# Patient Record
Sex: Male | Born: 1945 | Race: White | Hispanic: No | Marital: Married | Smoking: Former smoker
Health system: Southern US, Community
[De-identification: ages and names within clinical notes are randomized; demographics above are authoritative.]

## PROBLEM LIST (undated history)

## (undated) DIAGNOSIS — IMO0002 Reserved for concepts with insufficient information to code with codable children: Secondary | ICD-10-CM

## (undated) DIAGNOSIS — M792 Neuralgia and neuritis, unspecified: Secondary | ICD-10-CM

## (undated) DIAGNOSIS — M1990 Unspecified osteoarthritis, unspecified site: Secondary | ICD-10-CM

## (undated) DIAGNOSIS — Z923 Personal history of irradiation: Secondary | ICD-10-CM

## (undated) DIAGNOSIS — C3490 Malignant neoplasm of unspecified part of unspecified bronchus or lung: Secondary | ICD-10-CM

## (undated) DIAGNOSIS — F0390 Unspecified dementia without behavioral disturbance: Secondary | ICD-10-CM

## (undated) DIAGNOSIS — I10 Essential (primary) hypertension: Secondary | ICD-10-CM

## (undated) DIAGNOSIS — F419 Anxiety disorder, unspecified: Secondary | ICD-10-CM

## (undated) DIAGNOSIS — I509 Heart failure, unspecified: Secondary | ICD-10-CM

## (undated) DIAGNOSIS — E785 Hyperlipidemia, unspecified: Secondary | ICD-10-CM

## (undated) DIAGNOSIS — I2510 Atherosclerotic heart disease of native coronary artery without angina pectoris: Secondary | ICD-10-CM

## (undated) DIAGNOSIS — I739 Peripheral vascular disease, unspecified: Secondary | ICD-10-CM

## (undated) HISTORY — DX: Heart failure, unspecified: I50.9

## (undated) HISTORY — DX: Unspecified osteoarthritis, unspecified site: M19.90

## (undated) HISTORY — DX: Unspecified dementia, unspecified severity, without behavioral disturbance, psychotic disturbance, mood disturbance, and anxiety: F03.90

## (undated) HISTORY — DX: Anxiety disorder, unspecified: F41.9

## (undated) HISTORY — DX: Atherosclerotic heart disease of native coronary artery without angina pectoris: I25.10

## (undated) HISTORY — DX: Neuralgia and neuritis, unspecified: M79.2

## (undated) HISTORY — PX: LUNG CANCER SURGERY: SHX702

## (undated) HISTORY — DX: Hyperlipidemia, unspecified: E78.5

## (undated) HISTORY — DX: Reserved for concepts with insufficient information to code with codable children: IMO0002

## (undated) HISTORY — DX: Peripheral vascular disease, unspecified: I73.9

---

## 1994-04-05 HISTORY — PX: CORONARY ARTERY BYPASS GRAFT: SHX141

## 1997-09-05 HISTORY — PX: CAROTID ENDARTERECTOMY: SUR193

## 2001-07-05 ENCOUNTER — Ambulatory Visit (HOSPITAL_COMMUNITY): Admission: RE | Admit: 2001-07-05 | Discharge: 2001-07-05 | Payer: Self-pay | Admitting: Cardiology

## 2001-07-05 ENCOUNTER — Encounter: Payer: Self-pay | Admitting: Cardiology

## 2004-11-01 ENCOUNTER — Ambulatory Visit: Payer: Self-pay | Admitting: Cardiology

## 2004-11-18 ENCOUNTER — Ambulatory Visit: Payer: Self-pay

## 2004-12-12 ENCOUNTER — Ambulatory Visit: Payer: Self-pay | Admitting: Cardiology

## 2004-12-16 ENCOUNTER — Ambulatory Visit: Payer: Self-pay

## 2004-12-16 ENCOUNTER — Ambulatory Visit: Payer: Self-pay | Admitting: Cardiology

## 2004-12-20 ENCOUNTER — Ambulatory Visit: Payer: Self-pay | Admitting: Cardiology

## 2004-12-20 ENCOUNTER — Ambulatory Visit (HOSPITAL_COMMUNITY): Admission: RE | Admit: 2004-12-20 | Discharge: 2004-12-21 | Payer: Self-pay | Admitting: Cardiology

## 2004-12-20 HISTORY — PX: CARDIAC CATHETERIZATION: SHX172

## 2004-12-27 ENCOUNTER — Ambulatory Visit: Payer: Self-pay | Admitting: Cardiology

## 2005-01-10 ENCOUNTER — Ambulatory Visit: Payer: Self-pay | Admitting: Cardiology

## 2005-01-24 ENCOUNTER — Ambulatory Visit: Payer: Self-pay | Admitting: Cardiology

## 2005-02-10 ENCOUNTER — Ambulatory Visit: Payer: Self-pay | Admitting: Cardiology

## 2005-06-13 ENCOUNTER — Ambulatory Visit: Payer: Self-pay | Admitting: Cardiology

## 2005-06-23 ENCOUNTER — Ambulatory Visit: Payer: Self-pay

## 2005-10-03 ENCOUNTER — Ambulatory Visit: Payer: Self-pay | Admitting: Cardiology

## 2005-11-03 ENCOUNTER — Ambulatory Visit: Payer: Self-pay | Admitting: Cardiology

## 2005-11-19 ENCOUNTER — Ambulatory Visit: Payer: Self-pay

## 2006-04-09 ENCOUNTER — Ambulatory Visit: Payer: Self-pay | Admitting: Cardiology

## 2006-04-17 ENCOUNTER — Ambulatory Visit: Payer: Self-pay

## 2006-10-12 ENCOUNTER — Ambulatory Visit: Payer: Self-pay | Admitting: Cardiology

## 2006-11-19 ENCOUNTER — Ambulatory Visit: Payer: Self-pay | Admitting: Cardiology

## 2006-11-19 ENCOUNTER — Ambulatory Visit: Payer: Self-pay

## 2006-11-19 LAB — CONVERTED CEMR LAB
ALT: 35 units/L (ref 0–40)
BUN: 4 mg/dL — ABNORMAL LOW (ref 6–23)
Bilirubin, Direct: 0.2 mg/dL (ref 0.0–0.3)
Calcium: 9.1 mg/dL (ref 8.4–10.5)
Cholesterol: 118 mg/dL (ref 0–200)
GFR calc Af Amer: 127 mL/min
GFR calc non Af Amer: 105 mL/min
Glucose, Bld: 106 mg/dL — ABNORMAL HIGH (ref 70–99)
LDL Cholesterol: 48 mg/dL (ref 0–99)
Total CHOL/HDL Ratio: 3.8

## 2007-04-19 ENCOUNTER — Ambulatory Visit: Payer: Self-pay | Admitting: Cardiology

## 2007-04-26 ENCOUNTER — Ambulatory Visit: Payer: Self-pay

## 2007-07-19 ENCOUNTER — Ambulatory Visit: Payer: Self-pay | Admitting: Cardiology

## 2007-07-19 LAB — CONVERTED CEMR LAB
Albumin: 3.5 g/dL (ref 3.5–5.2)
Bilirubin, Direct: 0.1 mg/dL (ref 0.0–0.3)
Total Bilirubin: 0.5 mg/dL (ref 0.3–1.2)
Total CHOL/HDL Ratio: 4.6
Total Protein: 6.9 g/dL (ref 6.0–8.3)
Triglycerides: 140 mg/dL (ref 0–149)

## 2007-11-22 ENCOUNTER — Ambulatory Visit: Payer: Self-pay

## 2008-02-01 ENCOUNTER — Ambulatory Visit: Payer: Self-pay | Admitting: Cardiology

## 2008-04-11 ENCOUNTER — Ambulatory Visit: Payer: Self-pay | Admitting: Cardiology

## 2008-10-02 ENCOUNTER — Ambulatory Visit: Payer: Self-pay | Admitting: Internal Medicine

## 2008-10-02 ENCOUNTER — Emergency Department (HOSPITAL_COMMUNITY): Admission: EM | Admit: 2008-10-02 | Discharge: 2008-10-02 | Payer: Self-pay | Admitting: Emergency Medicine

## 2008-10-03 ENCOUNTER — Telehealth (INDEPENDENT_AMBULATORY_CARE_PROVIDER_SITE_OTHER): Payer: Self-pay | Admitting: *Deleted

## 2008-10-16 ENCOUNTER — Ambulatory Visit: Payer: Self-pay | Admitting: Internal Medicine

## 2008-10-16 DIAGNOSIS — I10 Essential (primary) hypertension: Secondary | ICD-10-CM | POA: Insufficient documentation

## 2008-10-16 DIAGNOSIS — R222 Localized swelling, mass and lump, trunk: Secondary | ICD-10-CM | POA: Insufficient documentation

## 2008-10-16 DIAGNOSIS — E785 Hyperlipidemia, unspecified: Secondary | ICD-10-CM

## 2008-10-16 DIAGNOSIS — I219 Acute myocardial infarction, unspecified: Secondary | ICD-10-CM | POA: Insufficient documentation

## 2008-10-16 DIAGNOSIS — J309 Allergic rhinitis, unspecified: Secondary | ICD-10-CM | POA: Insufficient documentation

## 2008-10-19 ENCOUNTER — Ambulatory Visit: Admission: RE | Admit: 2008-10-19 | Discharge: 2008-10-19 | Payer: Self-pay | Admitting: Internal Medicine

## 2008-10-19 ENCOUNTER — Ambulatory Visit: Payer: Self-pay | Admitting: Internal Medicine

## 2008-10-19 ENCOUNTER — Encounter: Payer: Self-pay | Admitting: Internal Medicine

## 2008-10-25 ENCOUNTER — Encounter: Payer: Self-pay | Admitting: Internal Medicine

## 2008-10-25 ENCOUNTER — Ambulatory Visit (HOSPITAL_COMMUNITY): Admission: RE | Admit: 2008-10-25 | Discharge: 2008-10-25 | Payer: Self-pay | Admitting: Internal Medicine

## 2008-10-30 ENCOUNTER — Ambulatory Visit: Payer: Self-pay | Admitting: Internal Medicine

## 2008-11-02 ENCOUNTER — Ambulatory Visit: Payer: Self-pay | Admitting: Internal Medicine

## 2008-11-02 ENCOUNTER — Encounter: Payer: Self-pay | Admitting: Internal Medicine

## 2008-11-06 ENCOUNTER — Ambulatory Visit (HOSPITAL_COMMUNITY): Admission: RE | Admit: 2008-11-06 | Discharge: 2008-11-06 | Payer: Self-pay | Admitting: Cardiothoracic Surgery

## 2008-11-09 ENCOUNTER — Ambulatory Visit: Payer: Self-pay | Admitting: Internal Medicine

## 2008-11-13 ENCOUNTER — Ambulatory Visit: Admission: RE | Admit: 2008-11-13 | Discharge: 2009-02-06 | Payer: Self-pay | Admitting: Radiation Oncology

## 2008-11-13 ENCOUNTER — Ambulatory Visit: Payer: Self-pay | Admitting: Cardiology

## 2008-11-14 ENCOUNTER — Encounter (INDEPENDENT_AMBULATORY_CARE_PROVIDER_SITE_OTHER): Payer: Self-pay | Admitting: *Deleted

## 2008-11-14 LAB — COMPREHENSIVE METABOLIC PANEL
ALT: <8 U/L (ref 0–53)
AST: 9 U/L (ref 0–37)
Albumin: 4.1 g/dL (ref 3.5–5.2)
Alkaline Phosphatase: 107 U/L (ref 39–117)
Potassium: 3.6 mEq/L (ref 3.5–5.3)
Sodium: 141 mEq/L (ref 135–145)
Total Bilirubin: 0.4 mg/dL (ref 0.3–1.2)
Total Protein: 7 g/dL (ref 6.0–8.3)

## 2008-11-14 LAB — CBC WITH DIFFERENTIAL/PLATELET
BASO%: 0.3 % (ref 0.0–2.0)
EOS%: 1.7 % (ref 0.0–7.0)
Eosinophils Absolute: 0.2 10*3/uL (ref 0.0–0.5)
MCH: 27.9 pg — ABNORMAL LOW (ref 28.0–33.4)
MCHC: 34.5 g/dL (ref 32.0–35.9)
MCV: 81 fL — ABNORMAL LOW (ref 81.6–98.0)
MONO%: 5.2 % (ref 0.0–13.0)
NEUT#: 7.7 10*3/uL — ABNORMAL HIGH (ref 1.5–6.5)
RBC: 4.59 10*6/uL (ref 4.20–5.71)
RDW: 14.5 % (ref 11.2–14.6)

## 2008-11-14 LAB — CONVERTED CEMR LAB
BUN: 9 mg/dL
CO2: 26 meq/L
Calcium: 8.6 mg/dL
Chloride: 103 meq/L
Creatinine, Ser: 0.61 mg/dL
Potassium: 3.6 meq/L
Total Protein: 7 g/dL

## 2008-11-20 LAB — CBC WITH DIFFERENTIAL/PLATELET
Basophils Absolute: 0 10*3/uL (ref 0.0–0.1)
Eosinophils Absolute: 0.3 10*3/uL (ref 0.0–0.5)
HGB: 12.8 g/dL — ABNORMAL LOW (ref 13.0–17.1)
MCV: 80.6 fL — ABNORMAL LOW (ref 81.6–98.0)
NEUT#: 5.8 10*3/uL (ref 1.5–6.5)
RDW: 13.8 % (ref 11.2–14.6)
lymph#: 0.9 10*3/uL (ref 0.9–3.3)

## 2008-11-20 LAB — COMPREHENSIVE METABOLIC PANEL
Albumin: 4.2 g/dL (ref 3.5–5.2)
BUN: 8 mg/dL (ref 6–23)
Calcium: 8.9 mg/dL (ref 8.4–10.5)
Chloride: 104 mEq/L (ref 96–112)
Glucose, Bld: 104 mg/dL — ABNORMAL HIGH (ref 70–99)
Potassium: 3.8 mEq/L (ref 3.5–5.3)

## 2008-11-22 ENCOUNTER — Ambulatory Visit: Payer: Self-pay

## 2008-11-27 LAB — COMPREHENSIVE METABOLIC PANEL
ALT: 17 U/L (ref 0–53)
Albumin: 4 g/dL (ref 3.5–5.2)
Alkaline Phosphatase: 113 U/L (ref 39–117)
Glucose, Bld: 132 mg/dL — ABNORMAL HIGH (ref 70–99)
Potassium: 3.8 mEq/L (ref 3.5–5.3)
Sodium: 139 mEq/L (ref 135–145)
Total Protein: 6.6 g/dL (ref 6.0–8.3)

## 2008-11-27 LAB — CBC WITH DIFFERENTIAL/PLATELET
Basophils Absolute: 0 10*3/uL (ref 0.0–0.1)
EOS%: 0.9 % (ref 0.0–7.0)
Eosinophils Absolute: 0.1 10*3/uL (ref 0.0–0.5)
HGB: 12.6 g/dL — ABNORMAL LOW (ref 13.0–17.1)
NEUT#: 6.3 10*3/uL (ref 1.5–6.5)
RDW: 13.9 % (ref 11.0–14.6)
lymph#: 1 10*3/uL (ref 0.9–3.3)

## 2008-11-28 ENCOUNTER — Ambulatory Visit: Payer: Self-pay | Admitting: Cardiology

## 2008-11-28 ENCOUNTER — Inpatient Hospital Stay (HOSPITAL_COMMUNITY): Admission: EM | Admit: 2008-11-28 | Discharge: 2008-11-29 | Payer: Self-pay | Admitting: Emergency Medicine

## 2008-12-11 LAB — CBC WITH DIFFERENTIAL/PLATELET
Basophils Absolute: 0 10*3/uL (ref 0.0–0.1)
EOS%: 0.9 % (ref 0.0–7.0)
Eosinophils Absolute: 0 10*3/uL (ref 0.0–0.5)
HCT: 32 % — ABNORMAL LOW (ref 38.4–49.9)
HGB: 11 g/dL — ABNORMAL LOW (ref 13.0–17.1)
MCH: 28 pg (ref 27.2–33.4)
MCV: 81.5 fL (ref 79.3–98.0)
MONO%: 7.8 % (ref 0.0–14.0)
NEUT#: 2.6 10*3/uL (ref 1.5–6.5)
NEUT%: 75.2 % — ABNORMAL HIGH (ref 39.0–75.0)
RDW: 14.9 % — ABNORMAL HIGH (ref 11.0–14.6)

## 2008-12-11 LAB — COMPREHENSIVE METABOLIC PANEL
Albumin: 3.5 g/dL (ref 3.5–5.2)
Alkaline Phosphatase: 83 U/L (ref 39–117)
BUN: 7 mg/dL (ref 6–23)
Calcium: 8 mg/dL — ABNORMAL LOW (ref 8.4–10.5)
Chloride: 103 mEq/L (ref 96–112)
Creatinine, Ser: 0.49 mg/dL (ref 0.40–1.50)
Glucose, Bld: 138 mg/dL — ABNORMAL HIGH (ref 70–99)
Potassium: 3.7 mEq/L (ref 3.5–5.3)

## 2008-12-12 ENCOUNTER — Inpatient Hospital Stay (HOSPITAL_COMMUNITY): Admission: EM | Admit: 2008-12-12 | Discharge: 2008-12-18 | Payer: Self-pay | Admitting: Emergency Medicine

## 2008-12-12 ENCOUNTER — Ambulatory Visit: Payer: Self-pay | Admitting: Internal Medicine

## 2008-12-13 DIAGNOSIS — I25810 Atherosclerosis of coronary artery bypass graft(s) without angina pectoris: Secondary | ICD-10-CM | POA: Insufficient documentation

## 2008-12-13 DIAGNOSIS — I6529 Occlusion and stenosis of unspecified carotid artery: Secondary | ICD-10-CM

## 2008-12-13 DIAGNOSIS — I10 Essential (primary) hypertension: Secondary | ICD-10-CM

## 2008-12-21 ENCOUNTER — Ambulatory Visit: Payer: Self-pay | Admitting: Internal Medicine

## 2008-12-27 ENCOUNTER — Encounter: Payer: Self-pay | Admitting: Physician Assistant

## 2008-12-27 ENCOUNTER — Ambulatory Visit: Payer: Self-pay | Admitting: Internal Medicine

## 2008-12-27 LAB — CONVERTED CEMR LAB
Eosinophils Relative: 0.6 % (ref 0.0–5.0)
GFR calc non Af Amer: 178.61 mL/min (ref >60)
HCT: 31.4 % — ABNORMAL LOW (ref 39.0–52.0)
Hemoglobin: 10.9 g/dL — ABNORMAL LOW (ref 13.0–17.0)
Lymphs Abs: 0.7 10*3/uL (ref 0.7–4.0)
Monocytes Relative: 11.6 % (ref 3.0–12.0)
Neutro Abs: 3.9 10*3/uL (ref 1.4–7.7)
Potassium: 3.9 meq/L (ref 3.5–5.1)
RBC: 3.75 M/uL — ABNORMAL LOW (ref 4.22–5.81)
Sodium: 141 meq/L (ref 135–145)
WBC: 5.2 10*3/uL (ref 4.5–10.5)

## 2008-12-29 LAB — BASIC METABOLIC PANEL
Chloride: 101 mEq/L (ref 96–112)
Creatinine, Ser: 0.64 mg/dL (ref 0.40–1.50)
Potassium: 3.9 mEq/L (ref 3.5–5.3)
Sodium: 138 mEq/L (ref 135–145)

## 2008-12-29 LAB — CBC WITH DIFFERENTIAL/PLATELET
Basophils Absolute: 0 10*3/uL (ref 0.0–0.1)
Eosinophils Absolute: 0 10*3/uL (ref 0.0–0.5)
HGB: 11.6 g/dL — ABNORMAL LOW (ref 13.0–17.1)
MONO#: 0.6 10*3/uL (ref 0.1–0.9)
NEUT#: 4.4 10*3/uL (ref 1.5–6.5)
RBC: 4.25 10*6/uL (ref 4.20–5.82)
RDW: 17.3 % — ABNORMAL HIGH (ref 11.0–14.6)
WBC: 5.6 10*3/uL (ref 4.0–10.3)
nRBC: 0 % (ref 0–0)

## 2008-12-29 LAB — HOLD TUBE, BLOOD BANK

## 2009-01-01 LAB — COMPREHENSIVE METABOLIC PANEL
Albumin: 4.1 g/dL (ref 3.5–5.2)
Alkaline Phosphatase: 87 U/L (ref 39–117)
BUN: 13 mg/dL (ref 6–23)
Calcium: 8.9 mg/dL (ref 8.4–10.5)
Glucose, Bld: 127 mg/dL — ABNORMAL HIGH (ref 70–99)
Potassium: 3.8 mEq/L (ref 3.5–5.3)

## 2009-01-01 LAB — CBC WITH DIFFERENTIAL/PLATELET
Basophils Absolute: 0 10*3/uL (ref 0.0–0.1)
Eosinophils Absolute: 0 10*3/uL (ref 0.0–0.5)
HCT: 34.2 % — ABNORMAL LOW (ref 38.4–49.9)
HGB: 11.8 g/dL — ABNORMAL LOW (ref 13.0–17.1)
MCV: 82.7 fL (ref 79.3–98.0)
MONO%: 7.1 % (ref 0.0–14.0)
NEUT#: 6.2 10*3/uL (ref 1.5–6.5)
NEUT%: 83 % — ABNORMAL HIGH (ref 39.0–75.0)
Platelets: 183 10*3/uL (ref 140–400)
RDW: 18 % — ABNORMAL HIGH (ref 11.0–14.6)

## 2009-01-16 ENCOUNTER — Ambulatory Visit (HOSPITAL_COMMUNITY): Admission: RE | Admit: 2009-01-16 | Discharge: 2009-01-16 | Payer: Self-pay | Admitting: Radiation Oncology

## 2009-02-03 DIAGNOSIS — IMO0001 Reserved for inherently not codable concepts without codable children: Secondary | ICD-10-CM

## 2009-02-03 HISTORY — DX: Reserved for inherently not codable concepts without codable children: IMO0001

## 2009-02-06 ENCOUNTER — Ambulatory Visit: Admission: RE | Admit: 2009-02-06 | Discharge: 2009-02-23 | Payer: Self-pay | Admitting: Radiation Oncology

## 2009-02-14 ENCOUNTER — Ambulatory Visit: Payer: Self-pay | Admitting: Cardiology

## 2009-03-06 ENCOUNTER — Encounter: Payer: Self-pay | Admitting: Internal Medicine

## 2009-04-24 ENCOUNTER — Ambulatory Visit: Admission: RE | Admit: 2009-04-24 | Discharge: 2009-04-24 | Payer: Self-pay | Admitting: Radiation Oncology

## 2009-04-24 LAB — CREATININE, SERUM: Creatinine, Ser: 0.7 mg/dL (ref 0.40–1.50)

## 2009-04-24 LAB — BUN: BUN: 6 mg/dL (ref 6–23)

## 2009-04-25 ENCOUNTER — Ambulatory Visit (HOSPITAL_COMMUNITY): Admission: RE | Admit: 2009-04-25 | Discharge: 2009-04-25 | Payer: Self-pay | Admitting: Radiation Oncology

## 2009-04-26 ENCOUNTER — Ambulatory Visit: Admission: RE | Admit: 2009-04-26 | Discharge: 2009-04-26 | Payer: Self-pay | Admitting: Radiation Oncology

## 2009-04-26 ENCOUNTER — Encounter: Payer: Self-pay | Admitting: Cardiology

## 2009-04-26 LAB — BASIC METABOLIC PANEL
CO2: 26 mEq/L (ref 19–32)
Chloride: 104 mEq/L (ref 96–112)
Creatinine, Ser: 0.67 mg/dL (ref 0.40–1.50)
Potassium: 3.8 mEq/L (ref 3.5–5.3)
Sodium: 140 mEq/L (ref 135–145)

## 2009-05-18 ENCOUNTER — Ambulatory Visit: Payer: Self-pay | Admitting: Cardiology

## 2009-07-30 ENCOUNTER — Ambulatory Visit: Admission: RE | Admit: 2009-07-30 | Discharge: 2009-07-30 | Payer: Self-pay | Admitting: Radiation Oncology

## 2009-07-31 ENCOUNTER — Ambulatory Visit (HOSPITAL_COMMUNITY): Admission: RE | Admit: 2009-07-31 | Discharge: 2009-07-31 | Payer: Self-pay | Admitting: Radiation Oncology

## 2009-08-02 ENCOUNTER — Encounter: Payer: Self-pay | Admitting: Internal Medicine

## 2009-08-02 ENCOUNTER — Ambulatory Visit: Admission: RE | Admit: 2009-08-02 | Discharge: 2009-08-02 | Payer: Self-pay | Admitting: Radiation Oncology

## 2009-09-19 ENCOUNTER — Encounter (INDEPENDENT_AMBULATORY_CARE_PROVIDER_SITE_OTHER): Payer: Self-pay | Admitting: *Deleted

## 2009-09-21 ENCOUNTER — Ambulatory Visit: Payer: Self-pay | Admitting: Cardiology

## 2009-11-01 ENCOUNTER — Ambulatory Visit: Admission: RE | Admit: 2009-11-01 | Discharge: 2009-11-01 | Payer: Self-pay | Admitting: Radiation Oncology

## 2009-11-01 LAB — CREATININE, SERUM: Creatinine, Ser: 0.95 mg/dL (ref 0.40–1.50)

## 2009-11-01 LAB — BUN: BUN: 11 mg/dL (ref 6–23)

## 2009-11-02 ENCOUNTER — Ambulatory Visit (HOSPITAL_COMMUNITY): Admission: RE | Admit: 2009-11-02 | Discharge: 2009-11-02 | Payer: Self-pay | Admitting: Radiation Oncology

## 2009-11-08 ENCOUNTER — Encounter: Payer: Self-pay | Admitting: Cardiology

## 2009-11-28 ENCOUNTER — Encounter: Payer: Self-pay | Admitting: Cardiology

## 2009-11-29 ENCOUNTER — Ambulatory Visit: Payer: Self-pay

## 2009-11-29 ENCOUNTER — Encounter: Payer: Self-pay | Admitting: Cardiology

## 2010-02-04 ENCOUNTER — Ambulatory Visit (HOSPITAL_COMMUNITY): Admission: RE | Admit: 2010-02-04 | Discharge: 2010-02-04 | Payer: Self-pay | Admitting: Radiation Oncology

## 2010-02-07 ENCOUNTER — Encounter: Payer: Self-pay | Admitting: Cardiology

## 2010-02-07 ENCOUNTER — Ambulatory Visit: Admission: RE | Admit: 2010-02-07 | Discharge: 2010-02-07 | Payer: Self-pay | Admitting: Radiation Oncology

## 2010-02-07 LAB — BUN: BUN: 9 mg/dL (ref 6–23)

## 2010-02-13 ENCOUNTER — Ambulatory Visit (HOSPITAL_COMMUNITY): Admission: RE | Admit: 2010-02-13 | Discharge: 2010-02-13 | Payer: Self-pay | Admitting: Radiation Oncology

## 2010-03-15 ENCOUNTER — Ambulatory Visit: Payer: Self-pay | Admitting: Cardiology

## 2010-06-21 ENCOUNTER — Encounter (HOSPITAL_COMMUNITY): Admission: RE | Admit: 2010-06-21 | Discharge: 2010-06-21 | Payer: Self-pay | Admitting: Radiation Oncology

## 2010-06-21 ENCOUNTER — Other Ambulatory Visit: Payer: Self-pay | Admitting: Radiation Oncology

## 2010-06-21 LAB — CREATININE, SERUM: Creatinine, Ser: 0.82 mg/dL (ref 0.40–1.50)

## 2010-06-27 ENCOUNTER — Ambulatory Visit (HOSPITAL_COMMUNITY): Admission: RE | Admit: 2010-06-27 | Discharge: 2010-06-27 | Payer: Self-pay | Admitting: Radiation Oncology

## 2010-10-22 ENCOUNTER — Encounter: Payer: Self-pay | Admitting: Cardiology

## 2010-10-25 ENCOUNTER — Other Ambulatory Visit: Payer: Self-pay | Admitting: Radiation Oncology

## 2010-10-25 DIAGNOSIS — C3490 Malignant neoplasm of unspecified part of unspecified bronchus or lung: Secondary | ICD-10-CM

## 2010-11-07 NOTE — Assessment & Plan Note (Signed)
Summary: G9F   Visit Type:  Follow-up Referring Provider:  Dr. Carylon Perches Primary Provider:  Gabrielle Dare np  CC:  no cardiology complaints.  History of Present Illness: [name] comes in today for followup of his coronary artery disease, carotid artery disease, hypertension, hyperlipidemia.  His lung cancer is in remission. He looks the best I seen him in months. He denies angina or ischemic symptoms. He is having no dyspnea on exertion. He is staying quite active.  His weight is now very stable. He denies any symptoms of TIAs or mini strokes. He is very compliant with his medications.  His last carotid Dopplers were in February 2011. His a totally occluded left internal carotid artery, nonobstructive disease in the right internal carotid artery, patent graft on the right, and antegrade flow in both vertebrals. Findings are essentially stable.  Current Medications (verified): 1)  Amlodipine Besylate 10 Mg Tabs (Amlodipine Besylate) .... Once Daily 2)  Crestor 10 Mg Tabs (Rosuvastatin Calcium) .... Once Daily 3)  Alprazolam 0.5 Mg Tabs (Alprazolam) .... Three Times A Day 4)  Zetia 10 Mg Tabs (Ezetimibe) .... Once Daily 5)  Hydrochlorothiazide 12.5 Mg Caps (Hydrochlorothiazide) .... Once Daily 6)  Klor-Con M20 20 Meq Cr-Tabs (Potassium Chloride Crys Cr) .... Once Daily 7)  Hydrocodone-Acetaminophen 5-500 Mg Tabs (Hydrocodone-Acetaminophen) .... Four Times A Day 8)  Aspirin 325 Mg  Tabs (Aspirin) .... Once Daily 9)  Anti-Oxidant  Tabs (Multiple Vitamin) .... Once Daily 10)  Metoprolol Tartrate 50 Mg Tabs (Metoprolol Tartrate) .Marland Kitchen.. 1 Tab Two Times A Day 11)  Nitrostat 0.4 Mg Subl (Nitroglycerin) .Marland Kitchen.. 1 Tablet Under Tongue At Onset of Chest Pain; You May Repeat Every 5 Minutes For Up To 3 Doses. 12)  Gabapentin 300 Mg Caps (Gabapentin) .... Take 1 Tab Daily  Allergies (verified): 1)  ! Darvocet 2)  ! Penicillin  Past History:  Past Medical History: Last updated:  12/13/2008 Current Problems:  HYPERTENSION, BENIGN (ICD-401.1) CAROTID ARTERY STENOSIS, WITHOUT INFARCTION (ICD-433.10)S/P RIGHT CAROTID ENDARTERECTOMY, LEFT TOTALLY OCCLUDED. CAD, ARTERY BYPASS GRAFT (ICD-414.04)S/P CABG 1995, STENT OM VEIN GRAFT '06. CATH 11/29/08 SEVERE NATIVE VESSEL DISEASE WITH 100% LEFT MAINSTEM & RCA. 5 OF 5 GRAFTS PATENT, PATENT STENT SVG-OM1 & OM2 WITH MILD IN STENT RESTENOSIS, NORMAL LV FXN, NO CULPRIT FOR NON-ST ELEVATION MI SWELLING, MASS, OR LUMP IN CHEST (ICD-786.6) Hx of MYOCARDIAL INFARCTION (ICD-410.90) HYPERTENSION (ICD-401.9) HYPERLIPIDEMIA (ICD-272.4) ALLERGIC RHINITIS (ICD-477.9) NON-SMALL CELL LUNG CA RX CHEMO/RADIATION    Allergic Rhinitis Hyperlipidemia Hypertension Myocardial Infarction Right Apical Lung Mass dx 10/02/08  Past Surgical History: Last updated: 10/16/2008 angioplast neck surgery heart bypass stent  Family History: Last updated: 12/13/2008 heart disease-father, sister,brother cancer-father- lung mother died MI 3 sisters & 3 brothers with CAD  Social History: Last updated: 12/13/2008 married 2 girls retired Merchandiser, 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 Head:  normocephalic and atraumatic Eyes:  PERRLA/EOM intact; conjunctiva and lids normal. Neck:  Neck supple, no JVD. No masses, thyromegaly or abnormal cervical nodes. Lungs:  degrees breath sounds throughout Heart:  PMI nondisplaced, regular rate  and rhythm, normal S1-S2, bilateral carotid bruits with a loud high-pitched sound the left Abdomen:  Bowel sounds positive; abdomen soft and non-tender without masses, organomegaly, or hernias noted. No hepatosplenomegaly. Msk:  decreased ROM.   Pulses:  pulses normal in all 4 extremities Extremities:  trace left pedal edema and trace right pedal edema.   Neurologic:  Alert and oriented x 3. Skin:  Intact without lesions or rashes. Psych:  Normal affect.   Impression & Recommendations:  Problem # 1:  CAD, ARTERY BYPASS GRAFT (ICD-414.04)  His updated medication list for this problem includes:    Amlodipine Besylate 10 Mg Tabs (Amlodipine besylate) ..... Once daily    Aspirin 325 Mg Tabs (Aspirin) ..... Once daily    Metoprolol Tartrate 50 Mg Tabs (Metoprolol tartrate) .Marland Kitchen... 1 tab two times a day    Nitrostat 0.4 Mg Subl (Nitroglycerin) .Marland Kitchen... 1 tablet under tongue at onset of chest pain; you may repeat every 5 minutes for up to 3 doses.  Problem # 2:  CAROTID ARTERY STENOSIS, WITHOUT INFARCTION (ICD-433.10) I will see him in February of 2012 with repeat carotid Dopplers. His updated medication list for this problem includes:    Aspirin 325 Mg Tabs (Aspirin) ..... Once daily  Problem # 3:  Hx of MYOCARDIAL INFARCTION (ICD-410.90)  His updated medication list for this problem includes:    Amlodipine Besylate 10 Mg Tabs (Amlodipine besylate) ..... Once daily    Aspirin 325 Mg Tabs (Aspirin) ..... Once daily    Metoprolol Tartrate 50 Mg Tabs (Metoprolol tartrate) .Marland Kitchen... 1 tab two times a day    Nitrostat 0.4 Mg Subl (Nitroglycerin) .Marland Kitchen... 1 tablet under tongue at onset of chest pain; you may repeat every 5 minutes for up to 3 doses.  Problem # 4:  HYPERTENSION (ICD-401.9)  His updated medication list for this problem includes:    Amlodipine Besylate 10 Mg Tabs (Amlodipine besylate) ..... Once daily    Hydrochlorothiazide 12.5 Mg Caps (Hydrochlorothiazide) ..... Once daily    Aspirin  325 Mg Tabs (Aspirin) ..... Once daily    Metoprolol Tartrate 50 Mg Tabs (Metoprolol tartrate) .Marland Kitchen... 1 tab two times a day  Problem # 5:  HYPERLIPIDEMIA (ICD-272.4)  His updated medication list for this problem includes:    Crestor 10 Mg Tabs (Rosuvastatin calcium) ..... Once daily    Zetia 10 Mg Tabs (Ezetimibe) ..... Once daily   Patient Instructions: 1)  Your physician recommends that you schedule a follow-up appointment on: 02-12, 2012 2)  Your physician recommends that you continue on your current medications as directed. Please refer to the Current Medication list given to you today.

## 2010-11-07 NOTE — Miscellaneous (Signed)
Summary: Orders Update  Clinical Lists Changes  Orders: Added new Test order of Carotid Duplex (Carotid Duplex) - Signed 

## 2010-11-07 NOTE — Letter (Signed)
[facility]   Imported By: Marylou Mccoy 03/26/2010 11:47:10  _____________________________________________________________________  External Attachment:    Type:   Image     Comment:   External Document

## 2010-11-07 NOTE — Letter (Signed)
[facility]   Imported By: Roderic Ovens 12/25/2009 13:41:52  _____________________________________________________________________  External Attachment:    Type:   Image     Comment:   External Document

## 2010-11-08 ENCOUNTER — Ambulatory Visit: Payer: Medicare Other | Admitting: Internal Medicine

## 2010-11-08 LAB — CREATININE, SERUM: Creatinine, Ser: 0.9 mg/dL (ref 0.40–1.50)

## 2010-11-11 ENCOUNTER — Ambulatory Visit (HOSPITAL_COMMUNITY)
Admission: RE | Admit: 2010-11-11 | Discharge: 2010-11-11 | Disposition: A | Discharge status: Home / Self Care | Payer: Medicare Other | Source: Ambulatory Visit | Attending: Radiation Oncology | Admitting: Radiation Oncology

## 2010-11-11 ENCOUNTER — Encounter (HOSPITAL_COMMUNITY): Payer: Self-pay

## 2010-11-11 DIAGNOSIS — Z79899 Other long term (current) drug therapy: Secondary | ICD-10-CM | POA: Insufficient documentation

## 2010-11-11 DIAGNOSIS — Z09 Encounter for follow-up examination after completed treatment for conditions other than malignant neoplasm: Secondary | ICD-10-CM | POA: Insufficient documentation

## 2010-11-11 DIAGNOSIS — C3490 Malignant neoplasm of unspecified part of unspecified bronchus or lung: Secondary | ICD-10-CM | POA: Insufficient documentation

## 2010-11-11 HISTORY — DX: Malignant neoplasm of unspecified part of unspecified bronchus or lung: C34.90

## 2010-11-11 MED ORDER — IOHEXOL 300 MG/ML  SOLN
80.0000 mL | Freq: Once | INTRAMUSCULAR | Status: AC | PRN
  Administered 2010-11-11: 80 mL via INTRAVENOUS

## 2010-11-14 ENCOUNTER — Ambulatory Visit: Payer: Medicare Other | Admitting: Radiation Oncology

## 2010-11-14 ENCOUNTER — Encounter (HOSPITAL_COMMUNITY): Payer: Self-pay | Admitting: Radiology

## 2010-11-14 ENCOUNTER — Inpatient Hospital Stay (HOSPITAL_COMMUNITY)
Admission: EM | Admit: 2010-11-14 | Discharge: 2010-11-19 | DRG: 247 | Disposition: A | Discharge status: Home / Self Care | Payer: Medicare Other | Attending: Internal Medicine | Admitting: Internal Medicine

## 2010-11-14 ENCOUNTER — Emergency Department (HOSPITAL_COMMUNITY): Payer: Medicare Other

## 2010-11-14 DIAGNOSIS — E785 Hyperlipidemia, unspecified: Secondary | ICD-10-CM | POA: Diagnosis present

## 2010-11-14 DIAGNOSIS — I2582 Chronic total occlusion of coronary artery: Secondary | ICD-10-CM | POA: Diagnosis present

## 2010-11-14 DIAGNOSIS — Y849 Medical procedure, unspecified as the cause of abnormal reaction of the patient, or of later complication, without mention of misadventure at the time of the procedure: Secondary | ICD-10-CM | POA: Diagnosis present

## 2010-11-14 DIAGNOSIS — I25810 Atherosclerosis of coronary artery bypass graft(s) without angina pectoris: Secondary | ICD-10-CM

## 2010-11-14 DIAGNOSIS — I6529 Occlusion and stenosis of unspecified carotid artery: Secondary | ICD-10-CM | POA: Diagnosis present

## 2010-11-14 DIAGNOSIS — Z85118 Personal history of other malignant neoplasm of bronchus and lung: Secondary | ICD-10-CM

## 2010-11-14 DIAGNOSIS — T82897A Other specified complication of cardiac prosthetic devices, implants and grafts, initial encounter: Secondary | ICD-10-CM | POA: Diagnosis present

## 2010-11-14 DIAGNOSIS — Z888 Allergy status to other drugs, medicaments and biological substances status: Secondary | ICD-10-CM

## 2010-11-14 DIAGNOSIS — Z7982 Long term (current) use of aspirin: Secondary | ICD-10-CM

## 2010-11-14 DIAGNOSIS — I10 Essential (primary) hypertension: Secondary | ICD-10-CM | POA: Diagnosis present

## 2010-11-14 DIAGNOSIS — J309 Allergic rhinitis, unspecified: Secondary | ICD-10-CM | POA: Diagnosis present

## 2010-11-14 DIAGNOSIS — I214 Non-ST elevation (NSTEMI) myocardial infarction: Principal | ICD-10-CM | POA: Diagnosis present

## 2010-11-14 DIAGNOSIS — Z79899 Other long term (current) drug therapy: Secondary | ICD-10-CM

## 2010-11-14 DIAGNOSIS — I252 Old myocardial infarction: Secondary | ICD-10-CM

## 2010-11-14 DIAGNOSIS — I2510 Atherosclerotic heart disease of native coronary artery without angina pectoris: Secondary | ICD-10-CM | POA: Diagnosis present

## 2010-11-14 HISTORY — DX: Essential (primary) hypertension: I10

## 2010-11-14 LAB — CK TOTAL AND CKMB (NOT AT ARMC)
CK, MB: 115.3 ng/mL (ref 0.3–4.0)
Relative Index: 14.7 — ABNORMAL HIGH (ref 0.0–2.5)
Total CK: 783 U/L — ABNORMAL HIGH (ref 7–232)

## 2010-11-14 LAB — DIFFERENTIAL
Basophils Relative: 0 % (ref 0–1)
Eosinophils Absolute: 0.1 10*3/uL (ref 0.0–0.7)
Eosinophils Relative: 1 % (ref 0–5)
Monocytes Relative: 4 % (ref 3–12)
Neutrophils Relative %: 89 % — ABNORMAL HIGH (ref 43–77)

## 2010-11-14 LAB — CBC
MCH: 29.2 pg (ref 26.0–34.0)
MCV: 84 fL (ref 78.0–100.0)
Platelets: 120 10*3/uL — ABNORMAL LOW (ref 150–400)
RBC: 5.14 MIL/uL (ref 4.22–5.81)
RDW: 13.4 % (ref 11.5–15.5)

## 2010-11-14 LAB — COMPREHENSIVE METABOLIC PANEL
Albumin: 4.4 g/dL (ref 3.5–5.2)
Alkaline Phosphatase: 73 U/L (ref 39–117)
BUN: 6 mg/dL (ref 6–23)
Creatinine, Ser: 0.74 mg/dL (ref 0.4–1.5)
Glucose, Bld: 119 mg/dL — ABNORMAL HIGH (ref 70–99)
Total Bilirubin: 0.7 mg/dL (ref 0.3–1.2)
Total Protein: 7.4 g/dL (ref 6.0–8.3)

## 2010-11-14 LAB — POCT CARDIAC MARKERS
CKMB, poc: 6.2 ng/mL (ref 1.0–8.0)
Myoglobin, poc: 157 ng/mL (ref 12–200)
Troponin i, poc: <0.05 ng/mL (ref 0.00–0.09)

## 2010-11-14 LAB — PROTIME-INR
INR: 1.02 (ref 0.00–1.49)
Prothrombin Time: 13.6 seconds (ref 11.6–15.2)

## 2010-11-14 LAB — CARDIAC PANEL(CRET KIN+CKTOT+MB+TROPI)
CK, MB: 230.1 ng/mL (ref 0.3–4.0)
Relative Index: 13.5 — ABNORMAL HIGH (ref 0.0–2.5)

## 2010-11-14 MED ORDER — IOHEXOL 300 MG/ML  SOLN: 100.0000 mL | Freq: Once | INTRAMUSCULAR | Status: AC | PRN

## 2010-11-15 DIAGNOSIS — R072 Precordial pain: Secondary | ICD-10-CM

## 2010-11-15 DIAGNOSIS — I2510 Atherosclerotic heart disease of native coronary artery without angina pectoris: Secondary | ICD-10-CM

## 2010-11-15 LAB — GLUCOSE, CAPILLARY
Glucose-Capillary: 120 mg/dL — ABNORMAL HIGH (ref 70–99)
Glucose-Capillary: 112 mg/dL — ABNORMAL HIGH (ref 70–99)

## 2010-11-15 LAB — CBC
HCT: 40.1 % (ref 39.0–52.0)
Hemoglobin: 14.2 g/dL (ref 13.0–17.0)
MCHC: 35.4 g/dL (ref 30.0–36.0)
RBC: 4.74 MIL/uL (ref 4.22–5.81)

## 2010-11-15 LAB — BASIC METABOLIC PANEL
CO2: 23 mEq/L (ref 19–32)
Chloride: 105 mEq/L (ref 96–112)
GFR calc Af Amer: >60 mL/min (ref >60)
Glucose, Bld: 141 mg/dL — ABNORMAL HIGH (ref 70–99)
Sodium: 139 mEq/L (ref 135–145)

## 2010-11-15 LAB — CARDIAC PANEL(CRET KIN+CKTOT+MB+TROPI)
CK, MB: 157.9 ng/mL (ref 0.3–4.0)
Relative Index: 11.1 — ABNORMAL HIGH (ref 0.0–2.5)
Total CK: 1418 U/L — ABNORMAL HIGH (ref 7–232)

## 2010-11-15 LAB — MRSA PCR SCREENING: MRSA by PCR: NEGATIVE

## 2010-11-16 DIAGNOSIS — I248 Other forms of acute ischemic heart disease: Secondary | ICD-10-CM

## 2010-11-16 LAB — LIPID PANEL
LDL Cholesterol: 53 mg/dL (ref 0–99)
Total CHOL/HDL Ratio: 3.3 RATIO
VLDL: 27 mg/dL (ref 0–40)

## 2010-11-16 LAB — BASIC METABOLIC PANEL
BUN: 11 mg/dL (ref 6–23)
CO2: 27 mEq/L (ref 19–32)
Calcium: 9 mg/dL (ref 8.4–10.5)
Chloride: 106 mEq/L (ref 96–112)
Creatinine, Ser: 0.75 mg/dL (ref 0.4–1.5)
Glucose, Bld: 93 mg/dL (ref 70–99)

## 2010-11-16 LAB — GLUCOSE, CAPILLARY
Glucose-Capillary: 109 mg/dL — ABNORMAL HIGH (ref 70–99)
Glucose-Capillary: 117 mg/dL — ABNORMAL HIGH (ref 70–99)

## 2010-11-17 DIAGNOSIS — R079 Chest pain, unspecified: Secondary | ICD-10-CM

## 2010-11-17 LAB — BASIC METABOLIC PANEL
BUN: 13 mg/dL (ref 6–23)
CO2: 25 mEq/L (ref 19–32)
Calcium: 9.1 mg/dL (ref 8.4–10.5)
Glucose, Bld: 109 mg/dL — ABNORMAL HIGH (ref 70–99)
Sodium: 137 mEq/L (ref 135–145)

## 2010-11-18 LAB — BASIC METABOLIC PANEL
BUN: 13 mg/dL (ref 6–23)
Creatinine, Ser: 0.76 mg/dL (ref 0.4–1.5)
GFR calc non Af Amer: >60 mL/min (ref >60)
Glucose, Bld: 104 mg/dL — ABNORMAL HIGH (ref 70–99)
Potassium: 3.6 mEq/L (ref 3.5–5.1)

## 2010-11-19 LAB — BASIC METABOLIC PANEL
CO2: 27 mEq/L (ref 19–32)
Calcium: 8.9 mg/dL (ref 8.4–10.5)
Creatinine, Ser: 0.72 mg/dL (ref 0.4–1.5)
Glucose, Bld: 102 mg/dL — ABNORMAL HIGH (ref 70–99)

## 2010-11-19 LAB — CBC
HCT: 36.7 % — ABNORMAL LOW (ref 39.0–52.0)
Hemoglobin: 12.6 g/dL — ABNORMAL LOW (ref 13.0–17.0)
MCH: 29.4 pg (ref 26.0–34.0)
MCHC: 34.3 g/dL (ref 30.0–36.0)

## 2010-11-21 NOTE — H&P (Signed)
NAME:  [name], [name] NO.:  [id number]  MEDICAL RECORD NO.:  [id number]           PATIENT TYPE:  I  LOCATION:  2903                         FACILITY:  [facility]  PHYSICIAN:  [name]. [name], MD    DATE OF BIRTH:  [birth year]  DATE OF ADMISSION:  11/14/2010 DATE OF DISCHARGE:                             HISTORY & PHYSICAL   PRIMARY CARDIOLOGIST:  [name], MD, [facility]  ONCOLOGIST:  [name]. [name], MD  CHIEF COMPLAINT:  Chest pain.  HISTORY OF PRESENT ILLNESS:  The patient is a 65 year old man with significant coronary artery disease status post CABG in 1995.  The patient had an NSTEMI and cath done in 2006 where a stent was put in his obtuse marginal vein graft.  The patient also had NSTEMI in February 2010 with 100% left main, circumflex and RCA, 5/5 grafts were patent, and there was patent stent SVG to ostial marginal-1 and ostial marginal-2 with mild in-stent restenosis with normal LV function and no culprit vessel for non-ST-elevation MI identified at that time.  The patient also has history of hypertension, hyperlipidemia, and non-small-cell lung cancer status post chemo and radiation and in remission right now.  The patient was brought in this morning by EMS for severe chest pain.  The patient stated that he had 8/10 chest pain right in the middle of his chest which was more of a pressure-like pain.  No exacerbating or relieving factors.  The pain was radiating to his left arm.  The patient took some nitro tablets and also took four 81 mg aspirin which did not relieve the pain.  Pain was also associated with some difficulty in breathing.  The patient also said that he has been having chest pain for the last 3 weeks on and off similar to what he had this morning.  The pain has been unrelated to exertion and food.  There have been no relieving factors and the patient had worse pain since last 3 days.   The patient said that he fell 7 days ago and his  chest was hit, which has been sore ever since then.  The patient also complained of reflux-like symptoms since last 3 weeks.  The patient has had a GI workup in the past for this and states that he does not have any ulcer or any reflux disease.  No complaints of edema, shortness of breath, dyspnea on exertion, orthopnea, or PND noted. The patient said that he actually walked on a treadmill for 30 minutes yesterday evening prior to admission and did not have any episodes of chest pain or shortness of breath.  PAST MEDICAL HISTORY:  Prior cardiac catheterization in 2006 and in 2010 as explained above, the patient had left ventricular ejection fraction of 65% on cath done on February 2010, history of hypertension, hyperlipidemia, non-small-cell lung cancer in remission, followed by Dr. [name].  The patient has right carotid endarterectomy done and also had left internal carotid artery 100% occluded on the most recent carotid Doppler study done in February 2011.  The patient also has allergic rhinitis.  PAST SURGICAL HISTORY:  Coronary artery bypass grafting  in 1995, had neck carotid endarterectomy in 2009, had a right apical lung mass resection in 2009.  The patient is allergic to: 1. PENICILLIN. 2. DEMEROL. 3. PLAVIX. 4. DARVOCET. 5. OXYCODONE.  The patient at home is on: 1. Norvasc 10 mg daily. 2. Crestor 10 mg daily. 3. Xanax 0.5 mg 3 times a day as needed. 4. Zetia 10 mg tablet once daily. 5. Hydrochlorothiazide 12.5 mg tablet once daily. 6. Klor-Con 20 mEq tablet once daily. 7. Vicodin 5/500 mg tablet 4 times a day as needed. 8. Aspirin 325 mg tablet once daily. 9. Antioxidant tablet once daily. 10.Metoprolol 50 mg tablet 1 tab 2 times a day. 11.Nitrostat 0.4 mg sublingual 1 tablet under tongue at onset of chest     pain, may repeat up to 3 times. 12.Gabapentin 300 mg capsules 1 tablet daily.  SOCIAL HISTORY:  The patient lives in [place] with his wife.  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  The patient is married, has 2 daughters who are present with him at the time of consult.  The patient has about 40-pack-year smoking history but quit in 1995.  No alcohol use.  No current drug use.  No herbal medicine use. The patient does exercise on his treadmill every once in awhile.  FAMILY HISTORY:  Mother died of myocardial infarction at the age of 45. Father died of lung cancer at the age of 73.  The patient has 3 brothers and 3 sisters and all have coronary artery disease.  REVIEW OF SYSTEMS:  Other than chest pain, shortness of breath, GERD- like symptoms, and abdominal pain is negative.  All other 10-point review of systems was reviewed with him.  CODE STATUS:  Full code.  PHYSICAL EXAMINATION:  VITAL SIGNS:  Temperature of 97.9, pulse 66, respiratory rate 15 per minute, blood pressure on arrival 178/73 which improved to 151/63, weight 150 pounds, and oxygen saturation 100% on 2 liters. GENERAL:  The patient was sitting comfortably in bed. HEENT:  Normocephalic and atraumatic.  Pupils equally round and reactive to light.  Extraocular movements intact.  Sclerae were clear.  Dentition was good.  No erythema or exudates were noted. NECK:  Supple.  No bruits were heard.  No jugular venous distention.  No lymphadenopathy. CARDIOVASCULAR:  Heart was regular rate and rhythm.  S1 and S2 was normal.  No murmurs, rubs, or gallops were heard.  Pulses were 2+ and equal bilaterally in lower and upper extremities without any bruits. LUNGS:  Clear to auscultation bilaterally without wheezing or rhonchi. SKIN:  No lesions. ABDOMEN:  Soft, nontender, and nondistended.  No rebound or guarding. No hepatosplenomegaly.  Some epigastric tenderness was present which was reproducible on palpation. EXTREMITIES:  No edema, rash, or lesions were noted. NEUROLOGIC:  Intact.  Grossly nonfocal.  RADIOLOGICAL EXAMINATION:  CT angio was negative  for any emboli.  Chest x-ray was done which showed no acute cardiopulmonary process other than status post right upper lobe lung resection.  EKG was unremarkable for any acute ST or T-wave changes.  LABORATORY DATA:  White count of 12.1 with absent neutrophil count of 10.8, hemoglobin 15.0, and platelets 120.  Sodium 138, potassium 3.6, chloride 102, bicarb 24, BUN 6, creatinine 0.74, glucose 119, calcium 9.2.  Total bilirubin 0.7, alkaline phosphatase 73, AST 24, ALT 19, total protein 7.4, and albumin 4.4.  First set of cardiac enzymes; CK-MB 6.2, troponins less than 0.05, and myoglobin of 157.  ASSESSMENT AND PLAN:  The patient is a 65 year old man with above- mentioned significant coronary artery disease status post CABG IN 1995 and stenting in 2006 and repeat cath in 2010 for non-ST-elevation myocardial infarction who comes in with chest pain concerning for unstable angina. 1. Unstable angina.  At this point in time given the patient's cardiac     enzymes are negative and EKG is unremarkable for any acute changes,     we do not think that the patient is having an acute coronary     syndrome, but the story is really concerning for a new blockage in     one of his grafts and we would admit the patient to Step-Down Unit.     We will start the patient on aspirin, beta-blockers, statins, and     nitrate and would monitor him while cycling the cardiac enzymes.     We would also obtain a 2-D echo on him just to see if there is any     wall motion abnormality. 2. Hypertension.  We would restart the patient's home blood pressure     medications at this point in time. 3. Hyperlipidemia.  We would get a fasting lipid panel and start the     patient on his home doses of Crestor and Zetia. 4. Non-small-cell lung cancer.  The patient was supposed to see Dr.     [name] today, but I think this can be rescheduled by the patient     for a later date.  We will not actively do anything for this      problem at this point in time. 5. Carotid stenosis. Patient is following with Neurology     for this and already had a right carotid endarterectomy and left     internal carotid artery is already 100% occluded.  The patient is     supposed to get another carotid Dopplers in February 2012 and I     think this can be followed up once the patient is discharged from     here by his neurologist.  Dr. Lewayne Bunting reviewed the case and agreed with the above assessment and plan.     Lars Mage, MD   ______________________________ [name]. [name], MD    AG/MEDQ  D:  11/14/2010  T:  11/15/2010  Job:  [id number]  Electronically Signed by Lars Mage  on 11/20/2010 04:44:08 PM Electronically Signed by Lewayne Bunting MD on 11/21/2010 05:45:37 PM

## 2010-11-27 NOTE — Discharge Summary (Signed)
NAME:  [name], [name] NO.:  [id number]  MEDICAL RECORD NO.:  [id number]           PATIENT TYPE:  I  LOCATION:  6527                         FACILITY:  [facility]  PHYSICIAN:  Arturo Morton. Riley Kill, MD, FACCDATE OF BIRTH:  1946-06-28  DATE OF ADMISSION:  11/14/2010 DATE OF DISCHARGE:  11/19/2010                              DISCHARGE SUMMARY   PRIMARY CARDIOLOGIST:  [name], MD, [facility]  ONCOLOGIST:  Lajuana Matte, MD  PRIMARY CARE PROVIDER:  Marin Roberts, NP.  DISCHARGE DIAGNOSES: 1. Coronary artery disease, non-ST-elevation myocardial infarction     this admission, status post percutaneous transluminal coronary     angioplasty of the saphenous vein graft to the obtuse marginal 1     and obtuse marginal 2 as well as successful percutaneous stenting     of the saphenous vein graft to two marginal branches using     overlapping drug-eluting stents.     a.     Status post coronary artery bypass grafting x5 in 1995, left      internal mammary artery to left anterior descending, saphenous      vein graft to third obtuse marginal, and posterolateral and      saphenous vein graft to first obtuse marginal and second obtuse      marginal. 2. Allergy to PLAVIX. 3. Hypertension. 4. Hyperlipidemia. 5. Non-small-cell lung cancer in remission, followed by Dr. [name]. 6. Peripheral arterial disease status post right carotid     endarterectomy with known left internal carotid artery occlusion.  ALLERGIES: 1. PENICILLINS causing joint swelling. 2. DEMEROL causing a rash. 3. PLAVIX causing a rash. 4. DARVOCET causing a rash and shortness of breath. 5. OXYCODONE causing heart rate issues.  PROCEDURES/DIAGNOSTICS:  Performed during hospitalization: 1. Left heart catheterization with selective coronary angiography,     left ventricular angiography, and percutaneous transluminal     coronary angioplasty of the saphenous vein graft, obtuse marginal 1     and obtuse  marginal 2.     a.     Continued patency of the left internal mammary artery to the      left anterior descending artery.     b.     Patent saphenous vein graft to obtuse marginal 3 and left      posterolateral branches with severe degenerative disease in the      body of the graft.     c.     Acutely occluded saphenous vein graft to obtuse marginal 1      and obtuse marginal 2 secondary to late stent thrombosis of a      previously placed TAXUS stent.     d.     Successful percutaneous transluminal coronary angioplasty of      the saphenous vein graft to the obtuse marginal 1 and obtuse      marginal 2.     e.     Preserved left ventricular function. 2. Staged percutaneous stenting of the saphenous vein graft to the     obtuse marginal with overlapping drug-eluting stent (3.0 x 28     Promus Element). 3. Chest  x-ray on November 14, 2010, showing presumed right apical post-     radiation change without acute cardiopulmonary process. 4. CT angiography of the chest demonstrating no evidence of acute     abnormalities, no evidence of pulmonary emboli or thoracic aortic     aneurysm.  Continued consolidation/post treatment changes to the     right lung apex.  Emphysema. 5. Echocardiogram on November 15, 2010, demonstrating left ventricle     ejection fraction that appears normal and approximately 55-60%.     There is suspicious lateral wall of the mid/distal regions that is     hypokinetic although there is a difficult acoustic window.  REASON FOR HOSPITALIZATION:  This is a 65 year old gentleman with significant coronary artery disease, as stated above, who was brought by EMS to [facility] with complaints of severe chest pain.  The patient tried nitroglycerin tablets, as well as aspirin, without relief.  Pain was associated with dyspnea.  The patient has been complaining of chest pain, was intermittent for the last 3 weeks.  The patient was admitted to the step-down unit for unstable angina as  his symptoms were concerning for new blockage in one of his grafts.  The patient's EKG was unremarkable for acute ST-T wave changes.  Initial point-of-care markers were negative.  HOSPITAL COURSE:  The patient was admitted to the step-down unit and continued to have chest pain with his initial set of cardiac enzymes being elevated with a troponin of 4; therefore, a left heart catheterization was indicated for a non-ST-elevation myocardial infarction with severe ongoing chest pain.  Dr. Excell Seltzer brought the patient to the cath lab, informed consent was obtained.  As above, there was acutely occluded SVG to OM-1 and 2 with successful percutaneous transluminal coronary angioplasty of the saphenous vein graft, OM-1, and OM-2.  The patient tolerated the procedure well and was continued on aspirin and Effient, as the patient is allergic to PLAVIX.  There is a plan to do a staged PCI on Monday involving the vein graft to the OM-3 and posterolateral branch of the circumflex as there was severe degenerative disease in the body of the graft.  The patient was kept in the hospital for staged procedure.  He did have an episode of chest pain that was relieved with Percocet.  An echocardiogram was obtained in the interim.  This demonstrated preserved left ventricular ejection fraction.  There was suspicious hypokinesis of the lateral wall of the mid to distal section, but there was a difficult acoustic window.  This patient was continued on medical management prior to elective catheterization.  During this time, the patient also worked with cardiac rehab.  He was able to ambulate without difficulty, no chest pain or shortness of breath.  Dr. Excell Seltzer evaluated the patient prior to catheterization on November 18, 2010, and noted him stable for staged PCI.  Risks, benefits, and indications were discussed with the patient and his wife, and they agreed to proceed.  Dr. Riley Kill brought the patient to the cath  lab, informed consent was obtained.  At this time, successful percutaneous stenting of the saphenous vein graft to the two marginal branches using an overlapping drug-eluting stent and distal protection with the FIRE catheter were completed.  The patient tolerated the procedure well.  The patient is to remain on aspirin and Effient for long-term.  There were no complications noted.  The patient's groin sites were noted to be stable without evidence of hematoma.  On day of discharge, Dr. Riley Kill evaluated  the patient and noted him stable for home.  He again ambulated with cardiac rehab and denied any chest pain, he tolerated this well.  Discharge plans and instructions were discussed with the patient and his wife, and they voiced understanding.  DISCHARGE LABORATORY DATA:  WBC 6.1, hemoglobin 12.6, hematocrit 36.7, platelets 106.  Sodium 138, potassium 3.9, BUN 9, creatinine 0.72.  DISCHARGE MEDICATIONS: 1. Prasugrel 10 mg one tablet daily. 2. Alprazolam 0.5 mg one tablet three times a day. 3. Amlodipine 10 mg one tablet daily. 4. Aspirin 325 mg one tablet daily. 5. Crestor 10 mg one tablet daily. 6. Gabapentin 300 mg one capsule daily. 7. Hydrochlorothiazide 12.5 mg one capsule daily. 8. Hydrocodone/APAP 5/500 mg one tablet four times a day. 9. Klor-Con 20 mEq one tablet daily. 10.Metoprolol tartrate 50 mg one tablet twice daily. 11.Multivitamin 1 tablet daily. 12.Nitroglycerin patch 0.4 mg per hour, one patch transdermally daily     as needed for chest pain. 13.Zetia 10 mg daily.  DISCHARGE PLANS AND INSTRUCTIONS: 1. The patient will follow up with Tereso Newcomer, PA, for Dr. Daleen Squibb on     December 04, 2010, at 10:00 a.m.  At this time, he will also have     carotid ultrasound. 2. The patient will follow up with Dr. Daleen Squibb in the [facility]     on December 18, 2010, at 11:30. 3. The patient is to increase activity slowly.  He may shower but no     bathing.  He is not to lift  anything greater than 5 pounds for 1     week.  He is not to drive for 2 days.  He is not to be sexually     active for 1 week.  He is to keep his cath sites clean and dry and     call our office for any problems. 4. He is to avoid straining and stop any activity that causes chest     pain or shortness of breath. 5. He is to continue on low-sodium heart-healthy diet. 6. He is to call our office in the interim for any problems or     questions.  DURATION OF DISCHARGE:  Greater than 30 minutes with physician and physician extender time.     Leonette Monarch, PA-C   ______________________________ Arturo Morton Riley Kill, MD, [facility]    NB/MEDQ  D:  11/19/2010  T:  11/20/2010  Job:  [id number]  cc:   Lajuana Matte, MD Jesse Sans. Wall, MD, [facility] Marin Roberts, nurse practitioner  Electronically Signed by Alen Blew P.A. on 11/26/2010 04:29:50 PM Electronically Signed by Shawnie Pons MD [facility] on 11/27/2010 09:11:27 PM

## 2010-11-27 NOTE — Procedures (Signed)
NAME:  [name], CADA NO.:  [id number]  MEDICAL RECORD NO.:  [id number]           PATIENT TYPE:  I  LOCATION:  6527                         FACILITY:  [facility]  PHYSICIAN:  Arturo Morton. Riley Kill, MD, FACCDATE OF BIRTH:  1946-03-31  DATE OF PROCEDURE:  11/18/2010 DATE OF DISCHARGE:                           CARDIAC CATHETERIZATION   INDICATIONS:  This gentleman presented with occlusion of the saphenous vein graft that had been previously stented and underwent percutaneous intervention.  He had marked progression in his other grafts, and was brought back for as a stage procedure set up by Dr. Excell Seltzer.  Dr. Excell Seltzer spoke with the patient's family and I also spoke with the patient's family at length.  Risks, benefits, and alternatives were discussed. The patient was consented to proceed.  PROCEDURE:  Percutaneous stenting in the saphenous vein graft to the obtuse marginal with overlapping drug-eluting stents.  DESCRIPTION OF PROCEDURE:  The patient has a known lung cancer.  He has a patent internal mammary.  Based on this, it was felt that attempts to keep his vein grafts open was the optimal approach.  We have elected to use the left femoral artery today.  This was entered through an anterior puncture, and a 6-French sheath was initially placed.  Bivalve routing was given according to protocol.  The patient has a PLAVIX allergy and has already been treated with Effient.  An AL-1 guiding catheter with side holes was utilized and had good seating in the vein graft.  We placed a Prowater wire distally after appropriate anticoagulation had been achieved.  Following this, a 4-mm spider protection device was placed distally.  Overlapping 3.0 x 28 Promus Element drug-eluting stents were placed.  Dilatations were done with each of them to 15 atmospheres with the hope of avoiding post dilatation, however, given the size of the graft, I elected to post dilate, especially as  the patient did not have problems with free flow.  Vein graft verapamil at 100 mcg had been administered earlier.  A 3.25 mm postdilatation was done with an Cameron Apex.  We carefully placed a retrieval catheter down through the overlapping stents without any resistance, and we were able to retrieve the spider catheter.  Final angiographic views were obtained.  The groin was then reprepped and gloves exchanged.  S7-8 closure device was utilized to close the femoral artery.  There were no major complications.  ANGIOGRAPHIC DATA:  The saphenous vein graft supplies a second marginal which then supplies the posterior descending in a retrograde fashion. There is a severe area of disease in the mid body of the graft followed by a lot of diffuse changes over long territory that is new from 2006 study.  We elected to cover all of these areas given the change. Following stenting, the areas were markedly improved with no residual stenosis and an excellent angiographic appearance with the overlapping drug-eluting stents.  Distal runoff was excellent.  There was no evidence of a no reflow phenomenon.  There was no evidence of significant distal embolization.  CONCLUSION:  Successful percutaneous stenting of the saphenous vein graft to 2 marginal branches using overlapping  drug-eluting stents, and distal protection with a spider catheter.  DISPOSITION:  The patient will remain on aspirin and Effient.  He is allergic to PLAVIX.  Given these findings, some long-term antiplatelet agent may be appropriate.  Perhaps at some point, a Brilinta might be a consideration.     Arturo Morton. Riley Kill, MD, [facility]     TDS/MEDQ  D:  11/18/2010  T:  11/19/2010  Job:  [id number]  cc:   Keona Bilyeu C. Daleen Squibb, MD, [facility] [place]. Excell Seltzer, MD CV Laboratory Lajuana Matte, MD  Electronically Signed by Shawnie Pons MD [facility] on 11/27/2010 09:11:24 PM

## 2010-12-03 ENCOUNTER — Encounter: Payer: Self-pay | Admitting: Cardiology

## 2010-12-04 ENCOUNTER — Encounter: Payer: Self-pay | Admitting: Physician Assistant

## 2010-12-04 ENCOUNTER — Other Ambulatory Visit: Payer: Self-pay | Admitting: Cardiology

## 2010-12-04 ENCOUNTER — Encounter (INDEPENDENT_AMBULATORY_CARE_PROVIDER_SITE_OTHER): Payer: Medicare Other

## 2010-12-04 ENCOUNTER — Encounter (INDEPENDENT_AMBULATORY_CARE_PROVIDER_SITE_OTHER): Payer: Medicare Other | Admitting: Physician Assistant

## 2010-12-04 ENCOUNTER — Other Ambulatory Visit: Payer: Medicare Other

## 2010-12-04 ENCOUNTER — Encounter: Payer: Self-pay | Admitting: Cardiology

## 2010-12-04 DIAGNOSIS — I6529 Occlusion and stenosis of unspecified carotid artery: Secondary | ICD-10-CM

## 2010-12-04 DIAGNOSIS — D696 Thrombocytopenia, unspecified: Secondary | ICD-10-CM | POA: Insufficient documentation

## 2010-12-04 DIAGNOSIS — R0989 Other specified symptoms and signs involving the circulatory and respiratory systems: Secondary | ICD-10-CM | POA: Insufficient documentation

## 2010-12-04 DIAGNOSIS — I2510 Atherosclerotic heart disease of native coronary artery without angina pectoris: Secondary | ICD-10-CM

## 2010-12-04 DIAGNOSIS — I739 Peripheral vascular disease, unspecified: Secondary | ICD-10-CM

## 2010-12-04 LAB — CBC WITH DIFFERENTIAL/PLATELET
Basophils Relative: 0.5 % (ref 0.0–3.0)
Eosinophils Relative: 1.8 % (ref 0.0–5.0)
Hemoglobin: 13.7 g/dL (ref 13.0–17.0)
Lymphocytes Relative: 13.6 % (ref 12.0–46.0)
MCHC: 34.3 g/dL (ref 30.0–36.0)
Neutro Abs: 4.4 10*3/uL (ref 1.4–7.7)
Neutrophils Relative %: 75.4 % (ref 43.0–77.0)
RBC: 4.49 Mil/uL (ref 4.22–5.81)
WBC: 5.8 10*3/uL (ref 4.5–10.5)

## 2010-12-12 NOTE — Assessment & Plan Note (Signed)
Summary: eph. per [facility] pa.gd   Visit Type:  eph Referring Provider:  Dr. Carylon Perches Primary Provider:  Gabrielle Dare np  CC:  no complaints.  History of Present Illness: Primary Cardiologist:  Dr. Valera Castle  [name] is a 65 yo male with a h/o CAD, status post CABG in 1995, hypertension, hyperlipidemia, carotid stenosis, status post right CEA and known occlusion of the left ICA.  He presented to [facility] February 9 and ruled in for non-ST elevation myocardial infarction.  He continued to have severe pain and was taken emergently to the cardiac catheterization lab.  He had an occluded S-OM1/OM2 and severe degen. disease in the S-OM3/PLB.  The L-LAD was patent.  He underwent angioplasty of the vein graft to the obtuse marginal-1 and obtuse marginal-2.  He was brought back to the Cath Lab for staged intervention with placement of overlapping drug-eluting stents to the vein graft to 2 obtuse marginals.  Echo done 11/15/10: EF 55-60%; ? lateral HK.  He was placed on Effient due to allergy to Plavix.    He denies any further chest pain or shortness of breath.  He denies orthopnea, PND or edema.  He denies syncope.  He denies any problems at his groin sites.  He has decided to do cardiac rehabilitation on his own.  He denies any problems with his medications.  Current Medications (verified): 1)  Amlodipine Besylate 10 Mg Tabs (Amlodipine Besylate) .... Once Daily 2)  Crestor 10 Mg Tabs (Rosuvastatin Calcium) .... Once Daily 3)  Alprazolam 0.5 Mg Tabs (Alprazolam) .... Three Times A Day 4)  Zetia 10 Mg Tabs (Ezetimibe) .... Once Daily 5)  Hydrochlorothiazide 12.5 Mg Caps (Hydrochlorothiazide) .... Once Daily 6)  Klor-Con M20 20 Meq Cr-Tabs (Potassium Chloride Crys Cr) .... Once Daily 7)  Hydrocodone-Acetaminophen 5-500 Mg Tabs (Hydrocodone-Acetaminophen) .... Four Times A Day 8)  Aspirin 325 Mg  Tabs (Aspirin) .... Once Daily 9)  Anti-Oxidant  Tabs (Multiple Vitamin) .... Once  Daily 10)  Metoprolol Tartrate 50 Mg Tabs (Metoprolol Tartrate) .Marland Kitchen.. 1 Tab Two Times A Day 11)  Nitrostat 0.4 Mg Subl (Nitroglycerin) .Marland Kitchen.. 1 Tablet Under Tongue At Onset of Chest Pain; You May Repeat Every 5 Minutes For Up To 3 Doses. 12)  Gabapentin 300 Mg Caps (Gabapentin) .... Take 1 Tab Daily 13)  Effient 10 Mg Tabs (Prasugrel Hcl) .... Take One Tablet By Mouth Daily 14)  Co-Gesic 5-500 Mg Tabs (Hydrocodone-Acetaminophen) .... Four Times A Day  Allergies (verified): 1)  ! Darvocet 2)  ! Penicillin 3)  ! Plavix 4)  ! Demerol  Past History:  Past Medical History: HYPERTENSION, BENIGN (ICD-401.1) CAROTID ARTERY STENOSIS, WITHOUT INFARCTION (ICD-433.10)S/P RIGHT CAROTID ENDARTERECTOMY, LEFT TOTALLY OCCLUDED. CAD, ARTERY BYPASS GRAFT (ICD-414.04)S/P CABG 1995,     a.  STENT OM VEIN GRAFT '06. CATH 11/29/08 SEVERE NATIVE VESSEL DISEASE WITH 100% LEFT          MAINSTEM & RCA. 5 OF 5 GRAFTS PATENT, PATENT STENT SVG-OM1 & OM2 WITH MILD IN STENT         RESTENOSIS, NORMAL LV FXN, NO CULPRIT FOR NON-ST ELEVATION MI   b.  NSTEMI 2/12: s/p PCTA S-OM1/OM2; DES x 2 S-OM; L-LAD ok HYPERTENSION (ICD-401.9) HYPERLIPIDEMIA (ICD-272.4) ALLERGIC RHINITIS (ICD-477.9) NON-SMALL CELL LUNG CA RX CHEMO/RADIATION Right Apical Lung Mass dx 10/02/08  Review of Systems       As per  the HPI.  All other systems reviewed and negative.   Vital Signs:  Patient profile:  65 year old male Height:      65 inches Weight:      142 pounds BMI:     23.72 Pulse rate:   61 / minute BP sitting:   122 / 69  (left arm) Cuff size:   regular  Vitals Entered By: Caralee Ates CMA (December 04, 2010 10:10 AM)  Physical Exam  General:  Well nourished, well developed, in no acute distress HEENT: normal Neck: no JVD Cardiac:  normal S1, S2; RRR; no murmur Lungs:  clear to auscultation bilaterally, no wheezing, rhonchi or rales Abd: soft, nontender, no hepatomegaly Ext: no edema; RFA site without hematoma or bruit;  LFA site without hematoma but with a positive bruit Skin: warm and dry Neuro:  CNs 2-12 intact, no focal abnormalities noted    EKG  Procedure date:  12/04/2010  Findings:      sinus bradycardia Heart rate 59 Normal axis T-wave inversions in leads one, aVL, V5-V6 No change since tracing dated 11/19/10  Impression & Recommendations:  Problem # 1:  CAD, ARTERY BYPASS GRAFT (ICD-414.04) He is doing well post PCI for NSTEMI.  He denies angina.  He prefers to do cardiac rehabilitation on his own at home.  He understands the importance of taking Effient and aspirin.  He will followup with Dr. Daleen Squibb in one month.  Orders: EKG w/ Interpretation (93000)  Problem # 2:  THROMBOCYTOPENIA (ICD-287.5) His platelet count was 106,000 prior to discharge.  I will obtain a CBC today to ensure that this is normalized.  Orders: TLB-CBC Platelet - w/Differential (85025-[id number])  Problem # 3:  FEMORAL BRUIT (ICD-785.9) He has a left femoral bruit.  I will obtain a left groin ultrasound to rule out pseudoaneurysm.  I have a low suspicion for this as both groin sites appear very stable.  Orders: Arterial Duplex Lower Extremity (Arterial Duplex Low)  Problem # 4:  HYPERTENSION, BENIGN (ICD-401.1) Controlled.  Problem # 5:  HYPERLIPIDEMIA (ICD-272.4) Followed by primary care. His updated medication list for this problem includes:    Crestor 10 Mg Tabs (Rosuvastatin calcium) ..... Once daily    Zetia 10 Mg Tabs (Ezetimibe) ..... Once daily  Problem # 6:  CAROTID ARTERY STENOSIS, WITHOUT INFARCTION (ICD-433.10) Dopplers done today.  No change in one year.  Repeat in one year.  Patient Instructions: 1)  Your physician recommends that you schedule a follow-up appointment in: 3/30/121 @ 10:15 with Dr. Daleen Squibb. 2)  Your physician has requested that you have a lower or upper extremity arterial duplex TODAY LEFT GROIN FOR BRUIT/PSEUDOANEURSYM @ 11:30 OK  AS PER HARRIETT GRIFFIN.  This test is an ultrasound  of the arteries in the legs or arms.  It looks at arterial blood flow in the legs and arms.  Allow one hour for Lower and Upper Arterial scans. There are no restrictions or special instructions. 3)  Your physician recommends that you return for lab work in: TODAY CBC 287.5 4)  Your physician has recommended you make the following change in your medication:

## 2010-12-12 NOTE — Miscellaneous (Signed)
Summary: Orders Update  Clinical Lists Changes  Orders: Added new Test order of Carotid Duplex (Carotid Duplex) - Signed 

## 2010-12-12 NOTE — Procedures (Signed)
NAME:  [name], [name] NO.:  [id number]  MEDICAL RECORD NO.:  [id number]           PATIENT TYPE:  I  LOCATION:  2903                         FACILITY:  [facility]  PHYSICIAN:  Veverly Fells. Excell Seltzer, MD  DATE OF BIRTH:  24-Dec-1945  DATE OF PROCEDURE: DATE OF DISCHARGE:                           CARDIAC CATHETERIZATION   PROCEDURES: 1. Left heart catheterization. 2. Selective coronary angiography. 3. Left ventricular angiography. 4. Percutaneous transluminal coronary angioplasty of the saphenous     vein graft, OM-1 and OM-2. 5. Attempted Perclose of the right femoral artery.  PROCEDURAL INDICATIONS:  [name] is a 65 year old gentleman who presented with acute coronary syndrome.  He was noted to have positive cardiac markers.  He has extensive CAD and has undergone remote coronary artery bypass surgery as well as PCI of one of his vein grafts back in 2006.  He was referred for cardiac catheterization.  On arrival to the Cardiac Cath Lab, the patient was having severe 10/10 chest pain and we proceeded on an emergent basis.  His EKG did not show ST-segment elevation, but he clearly had acute coronary syndrome with ongoing severe ischemic symptoms despite high-dose IV nitroglycerin and antithrombotic therapy with heparin.  Risks and indications of the procedure were reviewed with the patient, informed consent was obtained.  The right groin was prepped, draped and anesthetized with 1% lidocaine.  Using the modified Seldinger technique, 6-French sheath was placed in the right femoral artery.  The both native coronary arteries are known to be occluded.  Therefore, I went in directly with a JR-4 catheter, imaging of both vein grafts was performed.  The mammary was also imaged with the JR-4 catheter.  The sequenced vein grafts to the OM-1 and 2 was totally occluded.  This was clearly the patient's culprit vessel.  There was a stent in the distal body of the graft at the  distal anastomotic site, but the graft was occluded more proximally.  Bivalirudin was started for anticoagulation. The patient was given 60 mg of Prasugrel on the table.  An 6-French AL-1 guide catheter was inserted, a Cougar guidewire was advanced into the native vessel beyond the occlusion of the graft.  A 2.5 x 15-mm Trek balloon was advanced into the previously placed stent and was dilated just distal to the stent to 4 atmospheres and then was dilated within the stent to 6 atmospheres.  This restored TIMI 3 flow and the patient's chest pain subsequently resolved.  There was diffuse stenosis within the stent, but there was a step-down into a very small target vessel that I did not think was suitable for repeat stenting.  A 2.75 x 15-mm [place] Quantum apex balloon was advanced also into the stented segment.  Care was taken, so that the balloon was within the stent for the inflation and a total of three inflations were performed.  Distally, the balloon was taken to 10 atmospheres and was then taken to 12 atmospheres over the mid section of the stent and 16 atmospheres over the proximal section of the stent.  There was an excellent angiographic result with 0% residual stenosis and TIMI 3 flow  following balloon angioplasty.  I had decided not to re-stent the vessel as it was vein graft stent leading into a small target vessel.  The patient tolerated the interventional procedure well.  The AL-1 catheter was advanced further down into the ascending aorta and nonselective imaging of the left main was performed.  This was then changed out for pigtail catheter and ventriculography was done.  There was no transaortic valve gradient.  I attempted to close the femoral artery with a Perclose device, but the needles did not capture the artery because of heavy arterial calcification.  I was able to re-sheath the femoral artery with a 7-French sheath and the patient will undergo manual pressure for  hemostasis once his bivalirudin wears off.  PROCEDURAL FINDINGS:  There was no transaortic valvular gradient on LV pullback.  The left ventricular end-diastolic pressure was 18 mmHg.  Left ventriculography shows normal LV systolic function with an ejection fraction of 60%.  The left mainstem is totally occluded.  There is heavy calcification of the coronary tree involving both the left and right coronary arteries. The right coronary artery was not selectively injected as it is nondominant and known to be totally occluded.  LIMA to LAD is widely patent.  The anastomotic site is patent.  There is no stenosis throughout the mid and distal LAD, are fairly small in caliber, but there are no high-grade lesions present.  Saphenous vein graft to the OM-3 and left posterolateral branches is patent.  There is diffuse disease in the proximal and midbody of the graft with severe stenosis present.  There is TIMI 3 flow and the distal vessels of the circumflex are patent with diffuse nonobstructive stenosis.  Saphenous vein graft to the OM-1 and OM-2 is totally occluded.  After reperfusion, the distal branch vessels are patent and are fairly small in caliber.  FINAL ASSESSMENT: 1. Known total occlusion of both native coronary arteries. 2. Continued patency of the left internal mammary artery to left     anterior descending artery. 3. Patent saphenous vein graft to obtuse marginal 3 and left     posterolateral branches with severe degenerative disease in the     body of the graft. 4. Acutely occluded saphenous vein graft to obtuse marginal 1 and     obtuse marginal 2 secondary to very late stent thrombosis of a     previously placed Taxus stent. 5. Successful percutaneous transluminal coronary angioplasty of the     saphenous vein graft to obtuse marginal 1 and obtuse marginal 2 as     above. 6. Preserved left ventricular function.  RECOMMENDATIONS:  The patient will be continued on  aspirin and Effient and I would recommend lifelong therapy if he tolerates this, consideration for staged PCI on Monday involving the vein graft to OM-3 and posterolateral branches of the circumflex.     Veverly Fells. Excell Seltzer, MD     MDC/MEDQ  D:  11/14/2010  T:  11/15/2010  Job:  [id number]  cc:   Thomas C. Daleen Squibb, MD, [facility] Arturo Morton. Riley Kill, MD, [facility]  Electronically Signed by Tonny Bollman MD on 12/10/2010 08:57:16 PM

## 2010-12-18 ENCOUNTER — Ambulatory Visit: Payer: Self-pay | Admitting: Cardiology

## 2010-12-24 ENCOUNTER — Encounter: Payer: Self-pay | Admitting: Cardiology

## 2011-01-03 ENCOUNTER — Encounter: Payer: Self-pay | Admitting: Cardiology

## 2011-01-03 ENCOUNTER — Ambulatory Visit (INDEPENDENT_AMBULATORY_CARE_PROVIDER_SITE_OTHER): Payer: Medicare Other | Admitting: Cardiology

## 2011-01-03 ENCOUNTER — Other Ambulatory Visit: Payer: Self-pay | Admitting: *Deleted

## 2011-01-03 VITALS — BP 134/68 | HR 58 | Resp 14 | Ht 65.0 in | Wt 152.0 lb

## 2011-01-03 DIAGNOSIS — I219 Acute myocardial infarction, unspecified: Secondary | ICD-10-CM

## 2011-01-03 DIAGNOSIS — I25810 Atherosclerosis of coronary artery bypass graft(s) without angina pectoris: Secondary | ICD-10-CM

## 2011-01-03 DIAGNOSIS — I6529 Occlusion and stenosis of unspecified carotid artery: Secondary | ICD-10-CM

## 2011-01-03 MED ORDER — NITROGLYCERIN 0.4 MG SL SUBL: 0.4000 mg | SUBLINGUAL_TABLET | SUBLINGUAL | Status: DC | PRN

## 2011-01-03 NOTE — Assessment & Plan Note (Signed)
Stable and reviewed.

## 2011-01-03 NOTE — Patient Instructions (Signed)
Your physician recommends that you schedule a follow-up appointment in: 3 months with Dr Wall  

## 2011-01-03 NOTE — Assessment & Plan Note (Signed)
Doing well. No change in treatment. SL NTG renewed and what to use for and how to activate 911.

## 2011-01-03 NOTE — Assessment & Plan Note (Signed)
Stable, reviewed.

## 2011-01-03 NOTE — Progress Notes (Signed)
   Patient ID: [name], male    DOB: 1946-10-06, 65 y.o.   MRN: [id number]  HPI  Mr Weida returns post hospital for followup and management of recent NSTEMI with complex intervention. See hospital discharge summary for details. No angina or recurrent sxs. Tolerating Effient well. Recent carotids and LE dopplers stabe. No claudication.   Review of Systems  All other systems reviewed and are negative.      Physical Exam  Constitutional: He is oriented to person, place, and time. He appears well-developed and well-nourished.  HENT:  Head: Atraumatic.  Eyes: EOM are normal. Pupils are equal, round, and reactive to light.  Neck: Normal range of motion. No JVD present. No tracheal deviation present. No thyromegaly present.  Cardiovascular: Normal rate, regular rhythm and intact distal pulses.   Pulmonary/Chest: Effort normal and breath sounds normal.  Abdominal: Soft. Bowel sounds are normal.  Musculoskeletal: Normal range of motion. He exhibits no edema.  Neurological: He is alert and oriented to person, place, and time.  Skin: Skin is warm and dry.  Psychiatric: He has a normal mood and affect.

## 2011-01-06 ENCOUNTER — Encounter: Payer: Self-pay | Admitting: Cardiology

## 2011-01-14 ENCOUNTER — Other Ambulatory Visit: Payer: Self-pay | Admitting: *Deleted

## 2011-01-16 LAB — COMPREHENSIVE METABOLIC PANEL
ALT: 20 U/L (ref 0–53)
AST: 18 U/L (ref 0–37)
AST: 22 U/L (ref 0–37)
Albumin: 3.3 g/dL — ABNORMAL LOW (ref 3.5–5.2)
Albumin: 3.3 g/dL — ABNORMAL LOW (ref 3.5–5.2)
Alkaline Phosphatase: 105 U/L (ref 39–117)
Alkaline Phosphatase: 95 U/L (ref 39–117)
Chloride: 101 mEq/L (ref 96–112)
Chloride: 104 mEq/L (ref 96–112)
GFR calc Af Amer: >60 mL/min (ref >60)
GFR calc Af Amer: >60 mL/min (ref >60)
Potassium: 3.4 mEq/L — ABNORMAL LOW (ref 3.5–5.1)
Potassium: 3.6 mEq/L (ref 3.5–5.1)
Sodium: 134 mEq/L — ABNORMAL LOW (ref 135–145)
Total Bilirubin: 0.4 mg/dL (ref 0.3–1.2)
Total Bilirubin: 1.1 mg/dL (ref 0.3–1.2)
Total Protein: 6.5 g/dL (ref 6.0–8.3)
Total Protein: 6.9 g/dL (ref 6.0–8.3)

## 2011-01-16 LAB — HEPARIN LEVEL (UNFRACTIONATED)
Heparin Unfractionated: 0.19 IU/mL — ABNORMAL LOW (ref 0.30–0.70)
Heparin Unfractionated: 0.24 IU/mL — ABNORMAL LOW (ref 0.30–0.70)
Heparin Unfractionated: 0.36 IU/mL (ref 0.30–0.70)
Heparin Unfractionated: <0.1 IU/mL — ABNORMAL LOW (ref 0.30–0.70)
Heparin Unfractionated: <0.1 IU/mL — ABNORMAL LOW (ref 0.30–0.70)
Heparin Unfractionated: <0.1 IU/mL — ABNORMAL LOW (ref 0.30–0.70)

## 2011-01-16 LAB — CBC
HCT: 36.1 % — ABNORMAL LOW (ref 39.0–52.0)
Hemoglobin: 12 g/dL — ABNORMAL LOW (ref 13.0–17.0)
Hemoglobin: 12.2 g/dL — ABNORMAL LOW (ref 13.0–17.0)
Hemoglobin: 12.6 g/dL — ABNORMAL LOW (ref 13.0–17.0)
Hemoglobin: 13.8 g/dL (ref 13.0–17.0)
MCHC: 34.1 g/dL (ref 30.0–36.0)
MCHC: 34.4 g/dL (ref 30.0–36.0)
MCHC: 34.8 g/dL (ref 30.0–36.0)
MCHC: 34.9 g/dL (ref 30.0–36.0)
MCHC: 35 g/dL (ref 30.0–36.0)
MCV: 82.6 fL (ref 78.0–100.0)
MCV: 82.8 fL (ref 78.0–100.0)
Platelets: 203 10*3/uL (ref 150–400)
RBC: 4.19 MIL/uL — ABNORMAL LOW (ref 4.22–5.81)
RBC: 4.2 MIL/uL — ABNORMAL LOW (ref 4.22–5.81)
RBC: 4.26 MIL/uL (ref 4.22–5.81)
RBC: 4.36 MIL/uL (ref 4.22–5.81)
RBC: 4.5 MIL/uL (ref 4.22–5.81)
RBC: 4.51 MIL/uL (ref 4.22–5.81)
RBC: 4.79 MIL/uL (ref 4.22–5.81)
RDW: 15.1 % (ref 11.5–15.5)
RDW: 15.3 % (ref 11.5–15.5)
RDW: 15.9 % — ABNORMAL HIGH (ref 11.5–15.5)
RDW: 15.9 % — ABNORMAL HIGH (ref 11.5–15.5)
WBC: 10.8 10*3/uL — ABNORMAL HIGH (ref 4.0–10.5)
WBC: 5 10*3/uL (ref 4.0–10.5)
WBC: 8.5 10*3/uL (ref 4.0–10.5)

## 2011-01-16 LAB — BASIC METABOLIC PANEL
CO2: 20 mEq/L (ref 19–32)
CO2: 23 mEq/L (ref 19–32)
CO2: 25 mEq/L (ref 19–32)
Calcium: 8.8 mg/dL (ref 8.4–10.5)
Calcium: 9.4 mg/dL (ref 8.4–10.5)
Chloride: 105 mEq/L (ref 96–112)
Chloride: 105 mEq/L (ref 96–112)
Creatinine, Ser: 0.61 mg/dL (ref 0.4–1.5)
Creatinine, Ser: 0.64 mg/dL (ref 0.4–1.5)
GFR calc Af Amer: >60 mL/min (ref >60)
GFR calc Af Amer: >60 mL/min (ref >60)
GFR calc Af Amer: >60 mL/min (ref >60)
Glucose, Bld: 154 mg/dL — ABNORMAL HIGH (ref 70–99)
Glucose, Bld: 174 mg/dL — ABNORMAL HIGH (ref 70–99)
Potassium: 3.6 mEq/L (ref 3.5–5.1)
Sodium: 136 mEq/L (ref 135–145)
Sodium: 138 mEq/L (ref 135–145)

## 2011-01-16 LAB — AMYLASE: Amylase: 65 U/L (ref 27–131)

## 2011-01-16 LAB — DIFFERENTIAL
Basophils Absolute: 0 10*3/uL (ref 0.0–0.1)
Basophils Relative: 0 % (ref 0–1)
Basophils Relative: 2 % — ABNORMAL HIGH (ref 0–1)
Eosinophils Absolute: 0 10*3/uL (ref 0.0–0.7)
Eosinophils Absolute: 0 10*3/uL (ref 0.0–0.7)
Eosinophils Relative: 0 % (ref 0–5)
Monocytes Absolute: 0.1 10*3/uL (ref 0.1–1.0)
Monocytes Absolute: 0.1 10*3/uL (ref 0.1–1.0)
Monocytes Relative: 2 % — ABNORMAL LOW (ref 3–12)
Monocytes Relative: 2 % — ABNORMAL LOW (ref 3–12)
Neutro Abs: 4.1 10*3/uL (ref 1.7–7.7)
Neutro Abs: 7.4 10*3/uL (ref 1.7–7.7)
Neutrophils Relative %: 91 % — ABNORMAL HIGH (ref 43–77)

## 2011-01-16 LAB — BRAIN NATRIURETIC PEPTIDE: Pro B Natriuretic peptide (BNP): 34 pg/mL (ref 0.0–100.0)

## 2011-01-16 LAB — PROTIME-INR
INR: 1 (ref 0.00–1.49)
Prothrombin Time: 13.5 seconds (ref 11.6–15.2)

## 2011-01-16 LAB — MAGNESIUM: Magnesium: 2.2 mg/dL (ref 1.5–2.5)

## 2011-01-16 LAB — CARDIAC PANEL(CRET KIN+CKTOT+MB+TROPI)
CK, MB: 7 ng/mL — ABNORMAL HIGH (ref 0.3–4.0)
Relative Index: INVALID (ref 0.0–2.5)
Total CK: 63 U/L (ref 7–232)
Total CK: 88 U/L (ref 7–232)

## 2011-01-16 LAB — POCT CARDIAC MARKERS
Myoglobin, poc: 178 ng/mL (ref 12–200)
Myoglobin, poc: 33.6 ng/mL (ref 12–200)

## 2011-01-16 LAB — CK TOTAL AND CKMB (NOT AT ARMC): Total CK: 84 U/L (ref 7–232)

## 2011-01-20 LAB — GLUCOSE, CAPILLARY: Glucose-Capillary: 121 mg/dL — ABNORMAL HIGH (ref 70–99)

## 2011-01-21 LAB — HEPATIC FUNCTION PANEL
ALT: 25 U/L (ref 0–53)
Alkaline Phosphatase: 111 U/L (ref 39–117)
Total Protein: 7.4 g/dL (ref 6.0–8.3)

## 2011-01-21 LAB — URINE CULTURE: Colony Count: 2000

## 2011-01-21 LAB — BASIC METABOLIC PANEL
GFR calc non Af Amer: >60 mL/min (ref >60)
Glucose, Bld: 145 mg/dL — ABNORMAL HIGH (ref 70–99)
Potassium: 3.8 mEq/L (ref 3.5–5.1)
Sodium: 140 mEq/L (ref 135–145)

## 2011-01-21 LAB — URINALYSIS, ROUTINE W REFLEX MICROSCOPIC
Bilirubin Urine: NEGATIVE
Glucose, UA: 250 mg/dL — AB
Ketones, ur: NEGATIVE mg/dL
Leukocytes, UA: NEGATIVE
pH: 7 (ref 5.0–8.0)

## 2011-01-21 LAB — HEPARIN LEVEL (UNFRACTIONATED): Heparin Unfractionated: 0.11 IU/mL — ABNORMAL LOW (ref 0.30–0.70)

## 2011-01-21 LAB — GLUCOSE, CAPILLARY: Glucose-Capillary: 125 mg/dL — ABNORMAL HIGH (ref 70–99)

## 2011-01-21 LAB — HEMOGLOBIN A1C: Hgb A1c MFr Bld: 5.3 % (ref 4.6–6.1)

## 2011-01-21 LAB — POCT I-STAT, CHEM 8
BUN: 8 mg/dL (ref 6–23)
Calcium, Ion: 1.14 mmol/L (ref 1.12–1.32)
Chloride: 104 mEq/L (ref 96–112)
Glucose, Bld: 300 mg/dL — ABNORMAL HIGH (ref 70–99)

## 2011-01-21 LAB — CULTURE, BLOOD (ROUTINE X 2)

## 2011-01-21 LAB — CBC
HCT: 39.6 % (ref 39.0–52.0)
Hemoglobin: 13.5 g/dL (ref 13.0–17.0)
MCV: 82.6 fL (ref 78.0–100.0)
Platelets: 239 10*3/uL (ref 150–400)
RDW: 14.4 % (ref 11.5–15.5)
WBC: 16.1 10*3/uL — ABNORMAL HIGH (ref 4.0–10.5)

## 2011-01-21 LAB — DIFFERENTIAL
Eosinophils Relative: 0 % (ref 0–5)
Lymphocytes Relative: 3 % — ABNORMAL LOW (ref 12–46)
Lymphs Abs: 0.5 10*3/uL — ABNORMAL LOW (ref 0.7–4.0)
Monocytes Absolute: 0.2 10*3/uL (ref 0.1–1.0)
Monocytes Relative: 1 % — ABNORMAL LOW (ref 3–12)

## 2011-01-21 LAB — CARDIAC PANEL(CRET KIN+CKTOT+MB+TROPI)
CK, MB: 8.8 ng/mL — ABNORMAL HIGH (ref 0.3–4.0)
Relative Index: INVALID (ref 0.0–2.5)
Total CK: 97 U/L (ref 7–232)
Troponin I: 2.12 ng/mL (ref 0.00–0.06)

## 2011-01-21 LAB — APTT: aPTT: 28 seconds (ref 24–37)

## 2011-01-21 LAB — POCT CARDIAC MARKERS: Troponin i, poc: <0.05 ng/mL (ref 0.00–0.09)

## 2011-01-24 ENCOUNTER — Encounter: Payer: Self-pay | Admitting: Cardiology

## 2011-02-18 NOTE — Consult Note (Signed)
NAME:  [name], [name] NO.:  [id number]   MEDICAL RECORD NO.:  [id number]          PATIENT TYPE:  EMS   LOCATION:  MAJO                         FACILITY:  [facility]   PHYSICIAN:  Pricilla Riffle, MD, FACCDATE OF BIRTH:  1945/11/28   DATE OF CONSULTATION:  DATE OF DISCHARGE:  10/02/2008                                 CONSULTATION   PRIMARY CARE PHYSICIAN:  Lindaann Pascal, PA-C at [facility].   PRIMARY CARDIOLOGIST:  Jesse Sans. Wall, MD, [facility]   CHIEF COMPLAINT:  Right shoulder pain.   HISTORY OF PRESENT ILLNESS:  [name] is a 65 year old male with a  history of coronary artery disease.  Starting on Christmas Day, he had  right-sided chest pain.  It was in the upper outer part of his chest and  radiated through to his back.  It also radiated down his right arm and  he has some paresthesias with this.  It started at rest.  It was  intermittent and that he would relieve the pain with hydrocodone, but  then the pain would return.  He also took some extra Lopressor.  He had  multiple episodes and today his symptoms were worse.  He called our  office and was told to come to the emergency room.   [name] had some atypical symptoms at the time of his bypass surgery  and at the time of his last stent.  He states that his angina is in  epigastric/chest pain.  He states that these symptoms are not like his  cardiac symptoms and that it feels like a muscle strain to him.  Currently, he is having some symptoms, but does not appear acutely  uncomfortable.   PAST MEDICAL HISTORY:  1. Status post aortocoronary bypass surgery in 1995 with LIMA to LAD,      SVG to OM1 and OM2, SVG to OM3 and PL.  2. Cardiac catheterization in March 2006 showing left main totaled,      RCA of small vessel, LIMA to LAD patent, SVG to OM3 and PL patent,      SVG to OM1 and OM2 totaled, stenosis reduced to 0 with a drug-      eluting stent and EF 60%.  3. Status post  Myoview in July 2008, low risk study.  4. Hyperlipidemia.  5. Hypertension.  6. Remote history of tobacco use.  7. Family history of coronary artery disease.  8. 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   SURGICAL HISTORY:  He is status post cardiac catheterizations as well as  bypass surgery and neck surgery.   ALLERGIES:  He is allergic or intolerant to DARVOCET and LIPITOR.   CURRENT MEDICATIONS:  1. Hydrochlorothiazide 12.5 mg daily, b.i.d. p.r.n. occasionally.  2. Crestor 10 mg daily.  3. Norvasc 10 mg a day.  4. Potassium 20 mEq a day.  5. Zetia 10 mg a day.  6. Xanax 0.5 mg t.i.d. p.r.n.  7. Metoprolol 50 mg b.i.d.  8. Aspirin 81 mg a day.  9. Sublingual nitroglycerin p.r.n.  10.Hydrocodone p.r.n.   SOCIAL HISTORY:  Lives in [place] with his wife.  He is disabled  secondary to the cerebrovascular disease.  He has an approximately 30-  pack-year history of tobacco use, quit in 1995.  He denies alcohol or  drug abuse.   FAMILY HISTORY:  His mother died at age 85 and his father died at age  45, both with MIs.  He has multiple siblings with coronary artery  disease.   REVIEW OF SYSTEMS:  He coughs occasionally and has had some white  phlegm.  He has not had fevers or chills.  He has not had any wheezing.  He has occasional pedal edema, which he treats with an extra  hydrochlorothiazide.  He has chronic arthralgias and neck pain.  Full 14-  point review of systems is otherwise negative.   PHYSICAL EXAMINATION:  VITAL SIGNS:  Temperature is 98.5, blood pressure  156/75, pulse 78, respiratory rate 16, and O2 saturation 99% on room  air.  GENERAL:  He is a well-developed, well-nourished white male in no acute  distress, but does seem to have some anxiety.  HEENT:  Normal except for poor dentition.  NECK:  There is no lymphadenopathy, thyromegaly, or JVD noted, but he  does have bilateral bruits, left greater than right.  CV:  His  heart is regular in rate and rhythm with an S1, S2 and no  significant murmur, rub, or gallop is noted.  Distal pulses are intact  in all 4 extremities.  LUNGS:  Essentially clear to auscultation bilaterally.  SKIN:  No rashes or lesions are noted.  ABDOMEN:  Soft and nontender with active bowel sounds.  EXTREMITIES:  There is no cyanosis, clubbing, or edema noted.  MUSCULOSKELETAL:  He has tenderness to the upper right anterior shoulder  and upper scapular edge.  He is tender in the right paravertebral tubal  region and the supraspinatus area of the right shoulder.  There is no  spine or CVA tenderness.  There is no joint deformity or effusion noted.  NEUROLOGIC:  He is alert and oriented with cranial nerves II through XII  grossly intact.   Chest x-ray and labs are pending.   EKG; sinus rhythm, rate 77 with no acute ischemic changes.   IMPRESSION:  [name] was seen today by Dr. Dietrich Pates.  He has right-  sided chest pain that is tender to palpation and not like his previous  coronary artery disease symptoms.  On exam, the pain appears to be due  to musculoskeletal strain.  He will be started on anti-inflammatories  and an x-ray will be obtained to rule out  musculoskeletal injury, although there has been no trauma.  He will be  referred for physical therapy/rehab evaluation.  Labs would be checked  and if they are negative, he will be discharged home.  He will be  continued on his home medications and Motrin as well as hydrocodone will  be used for pain control.      Matthew Demark, PA-C      Pricilla Riffle, MD, [facility]  Electronically Signed    RB/MEDQ  D:  10/02/2008  T:  10/03/2008  Job:  [id number]

## 2011-02-18 NOTE — Op Note (Signed)
NAME:  BIRD, SWETZ NO.:  [id number]   MEDICAL RECORD NO.:  [id number]          PATIENT TYPE:  AMB   LOCATION:  CARD                         FACILITY:  [facility]   PHYSICIAN:  Casimiro Needle B. Sherene Sires, MD, FCCPDATE OF BIRTH:  07/16/1946   DATE OF PROCEDURE:  10/19/2008  DATE OF DISCHARGE:                               OPERATIVE REPORT   HISTORY:  A 65 year old white male, with severe digital clubbing and a  possible Pancoast tumor with associated with right hand pain, evaluated  the office within the last week with no change on history and exam since  the office consultation was performed.  Please see these records for  details.  The patient agreed to the procedure after a full discussion of  risks, benefits and alternatives.   The procedure was performed in the bronchoscopy suite with continuous  monitoring by surface ECG and oximetry.  The patient maintained adequate  saturation throughout the procedure in sinus rhythm.  He received a  total of 100 mg of IV Demerol and 5 mg of IV Versed for adequate  sedation and cough suppression.  The right naris was additionally  prepared with 2% lidocaine jelly.   Using a standard flexible fiberoptic bronchoscope, the right naris was  easily cannulated with good visualization of entire oropharynx and  larynx.  The cords moved normally and there were no apparent upper  airway lesions.   Using an additional 1% lidocaine as needed, the entire tracheobronchial  tree was explored bilaterally with the following findings:  Trachea,  carina and all of the major airways were widely patent to the  subsegmental lobe bilaterally with no focal endobronchial lesions.   PROCEDURE:  Using a wedge position within the apical segment of the  right upper lobe, the mass present at the right apex was accessed and  using fluoroscopic guidance three adequate biopsies were obtained with  minimal bleeding.   The patient tolerated the procedure well and a  follow-up chest x-ray is  pending at the time of this dictation.   IMPRESSION:  Pancoast tumor involving the right apex.  Workup will  include to await the histology on these biopsies plus a PET scan that  will be scheduled as an outpatient to see whether or not, indeed, the  chest wall is involved and/or brachial plexus is involved on the right  as clinically suspected.      Charlaine Dalton. Sherene Sires, MD, [facility]  Electronically Signed     MBW/MEDQ  D:  10/19/2008  T:  10/19/2008  Job:  [id number]   cc:   Lindaann Pascal  Dr. [facility]  [place], [place]   Jesse Sans. Wall, MD, FACC  [address]  [address]  [place]  [place] [address]

## 2011-02-18 NOTE — Discharge Summary (Signed)
NAME:  [name], [name] NO.:  [id number]   MEDICAL RECORD NO.:  [id number]          PATIENT TYPE:  INP   LOCATION:  2002                         FACILITY:  [facility]   PHYSICIAN:  Veverly Fells. Excell Seltzer, MD  DATE OF BIRTH:  Sep 13, 1946   DATE OF ADMISSION:  11/28/2008  DATE OF DISCHARGE:  11/29/2008                               DISCHARGE SUMMARY   DISCHARGE DIAGNOSES:  1. Non-ST elevated myocardial infarction status post cardiac      catheterization in this admission.  The patient with patent grafts,      diffuse coronary artery disease with recommendations for medical      therapy at this time.  2. Coronary artery disease status post previous bypass.  3. PLAVIX allergy.  4. Hypertension.  5. Dyslipidemia.  6. Recent diagnosis of non-small cell lung carcinoma being followed by      Dr. Shirline Frees, being treated with chemotherapy and  radiation.  7. Status post carotid endarterectomy.   PROCEDURES:  On this admission include cardiac catheterization as stated  above done by Dr. Tonny Bollman on November 29, 2008.   HOSPITAL COURSE:  [name] is a 65 year old Caucasian gentleman with  known history of coronary artery disease, previous bypass.  Other past  medical history as stated above, currently being treated with  chemotherapy and radiation for lung cancer, developed a chest pain on  day of admission, diffuse pressure, nonradiating, similar to pain prior  to his previous MI, associated with nausea, vomiting, diaphoresis,  duration 6-8 hours, relieved in ER with sublingual nitroglycerin.  EKG  showing diffuse ST depression and sinus tachycardia.  Initial point of  care enzymes negative.  However, the patient experienced a bump in  troponin with a peak of 2.12.  CK-MB peak of 8.8.  A 12-lead EKG  repeated on November 29, 2008, showing improved ST depression.  The  patient to the cath lab by Dr. Tonny Bollman.  Cath findings reviewed  with the patient and family.  No  clear culprit lesion seem to explain  his non-ST elevated MI.  Recommend medical therapy for diffuse CAD.  Dr.  Excell Seltzer also spoke with Dr. Jerolyn Center at [facility].  The patient okay  to proceed with treatment, scheduled for Monday March 1.  The patient  has been up ambulated in the halls post cath without any chest  discomfort or dyspnea and is stable to be discharged home.  The patient  has a followup appointment with Dr. Vern Claude PA on March 11 at 1:45,  followup with [facility] on March 1 or as previously instructed.  The patient has been given post cardiac catheterization supplemental  instructions.   DISCHARGE MEDICATIONS:  The only changes he has been given nitroglycerin  sublingual p.r.n., prescription provided.   PREVIOUS MEDICATIONS:  Aspirin 325, Zetia 10, metoprolol 50 b.i.d.,  Xanax 0.5 t.i.d., amlodipine 10 mg daily, hydrochlorothiazide 12.5  daily, Klor-Con 20 mEq daily, multivitamin and hydrocodone as previously  prescribed, Crestor 10 mg daily.   DISCHARGE INSTRUCTIONS:  Reviewed with the patient and his wife.  Duration of discharge encounter well over 30  minutes with education and  post cath instructions between myself and Dr. Excell Seltzer.      Dorian Pod, ACNP      Veverly Fells. Excell Seltzer, MD  Electronically Signed    MB/MEDQ  D:  11/29/2008  T:  11/30/2008  Job:  [id number]

## 2011-02-18 NOTE — H&P (Signed)
NAME:  [name], [name] NO.:  [id number]   MEDICAL RECORD NO.:  [id number]          PATIENT TYPE:  INP   LOCATION:  2002                         FACILITY:  [facility]   PHYSICIAN:  Madolyn Frieze. Jens Som, MD, FACCDATE OF BIRTH:  1945/11/06   DATE OF ADMISSION:  11/28/2008  DATE OF DISCHARGE:                              HISTORY & PHYSICAL   CHIEF COMPLAINT:  Chest pain.   HISTORY OF PRESENT ILLNESS:  A 65 year old with past medical history  significant for nonsmall cell carcinoma, pancreas tumor, on chemotherapy  and radiation, status post CABG, 1995 and status post drug-eluting  stent, 2006 and not on Plavix due to allergy who presented to emergency  department complaining of chest pain.  He describes chest pain as  pressure like tightness, heaviness, 10/10 in intensity associated with  nausea and diaphoresis.  He has had 3 episodes of chest pain in his  middle of his chest, not associated with meals, relieved by  nitroglycerin.  He relates the chest pain lasts for 3 minutes  intermittently.  It is not pleuritic in nature, no radiation.  He also  relates 3 episodes of vomiting food contents and no blood on it.  He  denies fever, chills, cough, abdominal pain, hematemesis.   REVIEW OF SYSTEMS:  All other review of systems negative except as per  HPI.   ALLERGIES:  DARVOCET, PENICILLIN AND PLAVIX.  He gets rash with PLAVIX.   PAST MEDICAL HISTORY:  1. Nonsmall cell carcinoma consistent with adenocarcinoma on      chemotherapy and radiation.  He has had 4 treatments so far.  2. Coronary artery disease, status post bypass, 1995 with LIMA to the      LAD, SVG to OM-1 and OM-2, SVG to OM-3.  Status post also drug-      eluting stent in 2006 with an ejection fraction of 60%.  3. Hyperlipidemia.  4. Hypertension.  5. Cerebrovascular disease with left ICA occluded, status post CEA.   SOCIAL HISTORY:  Former smoker, he quit in 1995.  He used to smoke one  pack per day for  more than 20 years.  Alcohol quit in 1995.   FAMILY HISTORY:  His father had heart problems, died at 44 years old,  mother died of a blood clot.   MEDICATIONS:  1. Amlodipine 10 mg daily.  2. Potassium chloride p.o. daily.  3. Metoprolol 50 p.o. b.i.d.  4. Alprazolam 0.5 mg t.i.d.  5. Aspirin 325 mg p.o. daily.  6. Multivitamins.  7. Zetia 10 mg p.o. daily.  8. Crestor 10 mg p.o. daily.  9. Hydrocodone 5/500 q.i.d.  10.Hydrochlorothiazide 12 mg daily.   PHYSICAL EXAMINATION:  VITAL SIGNS:  Blood pressure 159/63, pulse 98,  respirations 12, sat 100% on room air.  GENERAL:  The patient is in no acute distress, pleasant.  HEENT:  No thyromegaly.  Bilateral carotid bruits.  No JVD.  CARDIOVASCULAR:  S1 and S2, S3.  No JVD.  Systolic ejection murmur in  the aortic area.  LUNGS:  Bilateral good air movement.  Mild crackles, no wheezing.  ABDOMEN:  Bowel sounds  positive, soft, nontender, nondistended.  No  rigidity.  Aortic bruits present.  EXTREMITIES:  Pulse positive, pulse bilateral decreased.  No edema.  Bilateral femoral bruits.  He has clubbing bilateral.   LABORATORY DATA:  Labs, white blood cell 16.1, hemoglobin 13.5,  hematocrit 39.6, platelet 235.  Sodium 137, potassium 3.8, chloride 104,  bicarb 23, BUN 8, creatinine 0.5, glucose 300, troponin point-of-care  marker 0.05, CK-MB less than 1, myoglobulin 32.  Chest x-ray, no  significant changes in the appearance of the right upper lobe, a  spiculated mass.  EKG, ST segment depression in the inferior lead II,  III, aVF and anterior lead V4 through V6.   ASSESSMENT AND PLAN:  1. Chest pain in a 65 year old with significant and severe coronary      peripheral vascular disease, status post coronary artery bypass      graft and status post stent, drug-eluting, not on Plavix due to      allergy, presenting with chest pain and EKG changes with ST      depression in leads II, III, aVF and anterior lead.  Concern for      stent  restenosis and acute coronary syndrome is high.  We are going      to cycle cardiac enzymes. We are going to admit to telemetry.  We      are going to repeat EKG in the morning.  We are going to also check      2-D echocardiogram to reevaluate for pericardial effusion.  The      patient at this moment, is chest pain free.  We are going to      continue with medical treatment aspirin, Crestor, heparin and      metoprolol.  We are going to proceed with cardiac catheter in the      morning.  The patient is at risk for pulmonary embolism due to his      history of cancer, but his symptoms are more consistent with acute      coronary syndrome.  If cardiac cath is negative, we are going to      need to get a CT angio.  Chest x-ray, no pneumothorax, no      pneumonia, no infiltrates.  2. Leukocytosis.  This could be stress related versus infection or      could be secondary to the acute coronary syndrome.  We are going to      panculture.  We are going to check UA, urine culture, blood culture      x2.  Chest x-ray was clear for any pneumonia.  The patient denies      also any cough, fever, chills, diarrhea.  We are going to repeat      CBC in the morning.  No antibiotics at this moment.  3. Nausea and vomiting.  This could be secondary to chemotherapy      versus acute coronary syndrome versus pancreatitis.  We are going      to check lipase level.  4. Hyperglycemia.  We are going to check hemoglobin A1c.  Repeated      blood sugar was 110.  We are going to check lipase in the setting      of nausea, vomiting and hyperglycemia, concern for pancreatitis in      a patient who is getting chemotherapy.  5. Hypertension.  We are going to continue with metoprolol.  We are      going to hold hydrochlorothiazide to  avoid      diuresis because the patient was going to have a cath in the      morning.  We are going to start Norvasc in the morning as needed.  6. Lung cancer and pancoast tumor.  We are  going to continue with pain      medication.      Hartley Barefoot, MD  Electronically Signed      Madolyn Frieze. Jens Som, MD, [facility]  Electronically Signed    BR/MEDQ  D:  11/28/2008  T:  11/29/2008  Job:  [id number]

## 2011-02-18 NOTE — Assessment & Plan Note (Signed)
[facility]                       [place] CARDIOLOGY OFFICE NOTE   [name], [name]                    MRN:          [id number]  DATE:02/14/2009                            DOB:          08-30-1946    [name] returns today for followup of his coronary artery disease.  He was admitted with profound refractory nausea and vomiting from  chemotherapy for his non-small cell lung cancer.  He was in the hospital  from December 12, 2008 to December 18, 2008.  At that time, his cardiac markers  were positive for myocardial infarction.  He was treated medically.   He continues to respond very well to radiation therapy.  According to  his wife, his lung cancer looks a lot better on his most recent CT scan.  He is followed by Dr. Michell Heinrich at [facility].  Chemotherapy has been discontinued per the patient's request and  intolerance.   He has a history of coronary artery disease, status post bypass surgery  in 1995 with a stent placed in 2006.  He has hypertension,  hyperlipidemia, and had a carotid endarterectomy.  He quit smoking in  1995.   CURRENT MEDICATIONS:  1. Metoprolol 75 mg p.o. b.i.d.  2. Crestor 10 mg per day.  3. Zetia 10 mg per day.  4. HCTZ 12.5 mg per day.  5. Aspirin 325 mg a day.  6. Potassium 20 mEq a day.  7. Amlodipine 10 mg a day.  8. Gabapentin 300 mg a day.  9. He carries sublingual nitroglycerin.  10.He takes Xanax on p.r.n. basis.   PHYSICAL EXAMINATION:  GENERAL:  He looks better today than I have seen  him.  He has lost a substantial amount of weight down about 12 pounds  total.  His face is [place], but not nearly as it has been in the past.  He seems very calm, alert, and oriented x3.  VITAL SIGNS:  His blood pressures are excellent today at 128/60.  His  pulse is 64 and regular.  SKIN:  Warm and dry.  NECK:  Supple.  Carotid upstrokes were equal bilaterally with bilateral  carotid bruits.  Thyroid is  not enlarged.  Trachea is midline.  There is  no obvious lymphadenopathy.  LUNGS:  Decreased breath sounds in both bases with no rhonchi or  wheezes.  HEART:  Regular rate and rhythm.  No murmur, rub, or gallop.  ABDOMEN:  Soft.  Good bowel sounds.  No midline bruit.  No hepatomegaly.  EXTREMITIES:  No cyanosis, clubbing, or edema.  Pulses are intact.   [name] is stable from my standpoint.  We will continue with medical  therapy.  I am delighted that he is responding to radiation therapy.  I  will see him back in 3 months.     Thomas C. Daleen Squibb, MD, [facility]  Electronically Signed    TCW/MedQ  DD: 02/14/2009  DT: 02/15/2009  Job #: [id number]   cc:   Lurline Hare, MD  Samuel Jester

## 2011-02-18 NOTE — Assessment & Plan Note (Signed)
[facility]                            CARDIOLOGY OFFICE NOTE   GREIG, ALTERGOTT                   MRN:          [id number]  DATE:04/11/2008                            DOB:          [birth year]    Mr. Wadhwa returns today for further management of the following  problems.  His biggest concern is that he has been having a lot of  muscle aches and pains and on our last visit his Lipitor has been  stopped by his primary care physician.  His aches and pains did not get  better off Lipitor and now he is on Crestor 10 mg a day along with his  Zetia.  He is off the Triplex which he was on last visit.  He is due  blood work with his primary care physician, Dr. Jacqulyn Bath in a few weeks.  I  have advised him at this point to stay on this and that his aches and  pains are probably not statin related.   He has severe vascular disease with coronary artery disease status post  coronary bypass grafting in 1995.  He is currently having no angina.  His last stress Myoview was April 26, 2007, was low risk.  He also has a  history of hyperlipidemia with very low HDL.  He needs aggressive lipid-  lowering therapy.  He carries a diagnosis of cerebrovascular disease  with totally occluded left carotid and has had a right carotid  endarterectomy.  His carotid Dopplers in February 2009 were stable.   MEDICATIONS:  1. HCTZ 12.5 mg a day.  2. Enteric-coated aspirin 325 mg a day.  3. Amlodipine 10 mg day.  4. Potassium 20 mEq a day.  5. Multiple multivitamins.  6. Zetia 10 mg day.  7. Crestor 10 mg a day.  I am not sure if he still taking Triplex or      not.  However, he is taking Toprol.   PHYSICAL EXAMINATION:  VITAL SIGNS:  Blood pressure is 153/71.  His  pulse is 90 and irregular.  His weight is 156.  HEENT:  Unchanged.  NECK:  Carotids are full, except for down on the left side.  He is  status post right carotid endarterectomy.  He has got a right carotid  bruit.  There is no JVD.  LUNGS:  Clear to auscultation.  HEART:  Regular rate and rhythm.  No gallop.  ABDOMEN:  Soft with good bowel sounds.  There is no midline bruit.  There is no hepatomegaly.  EXTREMITIES:  No cyanosis, clubbing, or edema.  Pulses are intact.  NEUROLOGIC:  Intact.   ASSESSMENT AND PLAN:  Mr. Salinger is doing well other than some  generalized aches and pains that do not seem to be statin related.  I  have advised to stay on the Crestor and Zetia as well as other  medications.  He will have followup blood work with his primary care  physician in  about 3-4 weeks.  I have advised him to certainly get this done.  I will  plan on seeing him back again in February 2010  for followup.  He will  need a carotid Dopplers at that time.     Thomas C. Daleen Squibb, MD, [facility]  Electronically Signed    TCW/MedQ  DD: 04/11/2008  DT: 04/12/2008  Job #: [id number]   cc:   Lindaann Pascal, PA-C

## 2011-02-18 NOTE — Assessment & Plan Note (Signed)
[facility]                            CARDIOLOGY OFFICE NOTE   [name], [name]                   MRN:          [id number]  DATE:04/19/2007                            DOB:          1946/02/09    [name] comes in today for further management of the following  issues:  1. Coronary artery disease.  He is status post coronary bypass surgery      in 1995.  He has normal left ventricular function.  He also had a      stent placed to an obtuse marginal.  2. Vein graft in March of 2006.  This was for chest discomfort and      severe anterior lateral ischemia on a stress nuclear study.  His      last stress Myoview was April 17, 2006, showed an EF of 58% with      minimal anterior lateral ischemia.  We have treating him medically.      He is extremely anxious about having procedures in general.  He is      having no angina or ischemic equivalents at present.  3. Hyperlipidemia.  His lipids have been remarkably well-controlled      except for low HDL of 30.7 on last check.  His LDL was 48, total      was 118, triglycerides 187 on Zetia 10 mg a day and Lipitor 40 mg a      day.  He is having a lot of aches in his hands which responds to      Toradol which Dr. Jacqulyn Bath gave him.  4. Hypertension.  His blood pressure is always elevated in the office.      I have talked to him about this before.  We will have to go up on      his Norvasc today from 5 to 10.  5. Severe cerebrovascular disease.  He is status post right carotid      endarterectomy.  His left internal carotid is totally occluded.      His carotid Dopplers November 19, 2006 showed stable carotid      disease, chronically occluded left internal carotid, antegrade flow      in both vertebrals.   MEDICINES:  1. Zetia 10 mg a day.  2. Norvasc 5 mg a day.  3. Lipitor 40 mg a day.  4. HCTZ 12.5 mg a day.  5. Aspirin 325 a day.  6. Potassium 20 mEq a day.  7. Metoprolol 50 mg p.o. b.i.d.  8.  Centrum A-Z one daily.  9. Toradol p.r.n.  10.Arthritis.   He sleeps during the day off and on.  He does not have sleep apnea  according to his wife's history.  He does wake up during the night with  his hands aching.   His blood pressure today is elevated at 151/73, his pulse 73 and  regular, his weight is 154.  HEENT:  Ruddy complexion, PERRLA, extraocular movements intact, sclerae  are clear, facial symmetry is normal.  NECK:  Remarkable for a right carotid endarterectomy scar.  He has a  soft  bruit present.  Left is silent.  Thyroid is not enlarged, trachea  is midline.  Sternotomy site is stable.  PMI is nondisplaced.  He has normal S1-S2  without murmur, rub, or gallop.  ABDOMINAL:  Soft, good bowel sounds, no midline bruit, no hepatomegaly.  LUNGS:  Clear to auscultation.  EXTREMITIES:  Reveal pulses to be present in his lower extremities.  There is minimal edema.  Pulses are intact.  SKIN:  Shows a few ecchymosis, otherwise benign.  His hands show clubbing of his digits.  He has no major effusions of his  joints and no tenderness except at the metacarpophalangeal joint.   ASSESSMENT:  Mr. Sontag seems to be doing well.  He is due objective  assessment of his coronary disease with a rest stress Myoview.  We will  arrange.   I think Toradol is as reasonable as anything for his arthralgias.  I  have cut his Lipitor back to 20 mg a day in hopes that we can still keep  his LDL below 70.  Will check lipids and LFTs in 3 months.   Otherwise I will plan on seeing him back again in 6 months.   In regard to his hypertension I have increased his Norvasc to 10 mg a  day.     Matthew C. Daleen Squibb, MD, [facility]  Electronically Signed    TCW/MedQ  DD: 04/19/2007  DT: 04/20/2007  Job #: [id number]   cc:   Lindaann Pascal, PA

## 2011-02-18 NOTE — Discharge Summary (Signed)
NAME:  ADVIK, WEATHERSPOON NO.:  [id number]   MEDICAL RECORD NO.:  [id number]          PATIENT TYPE:  INP   LOCATION:  4712                         FACILITY:  [facility]   PHYSICIAN:  Jesse Sans. Wall, MD, FACCDATE OF BIRTH:  02/04/1946   DATE OF ADMISSION:  12/12/2008  DATE OF DISCHARGE:  12/18/2008                               DISCHARGE SUMMARY   DISCHARGING DIAGNOSES:  1. Coronary artery disease non-ST elevated myocardial infarction with      medical management at this time.  2. Refractory nausea and vomiting, finally resolved at time of      discharge.  3. Recent diagnosis of stage IIB non-small cell lung cancer,      adenocarcinoma under concurrent chemoradiation with chemotherapy      followed by Dr. [name].   PAST MEDICAL HISTORY:  1. Coronary artery disease, status post bypass in 1995 followed by      stent placement in 2006.  2. Hypertension.  3. Dyslipidemia.  4. Status post carotid endarterectomy.  5. Remote history of tobacco abuse.  The patient quit in 1995.   HISTORY OF PRESENT ILLNESS:  Mr. Eriksson is a 65 year old Caucasian  gentleman followed by Dr. Juanito Doom and Lindaann Pascal, PA at [facility].  Mr. Petteway presented on day of admission  complaining of chest discomfort.  The patient ruled in for non-ST  elevated MI by cardiac serial markers.  The patient with known coronary  artery disease, currently being treated for lung cancer.  The patient  admitted to the unit.  Adjustments made in beta-blocker therapy.  The  patient anticoagulated with heparin.  Heparin was discontinued. Note  patient is a cancer patient which makes her hypercoagulable.  The  patient had episodes of sinus tachycardia, but responded well to  increased beta-blocker dosing.  The patient experienced profound nausea  and vomiting.  Resistant to Cardiology p.r.n. medications.  Hematology/Oncology was asked to do a brief consultation regarding the  above.   Adjustments were made in the patient's medications to improve  symptoms.  Nausea, vomiting, and finally resolved 24 hours prior to  discharge.  The plan is to continue with medical management at this  time.  The patient has been seen by Dr. Daleen Squibb on day of discharge.  He is  afebrile, blood pressure 147/86.  The patient has been taken p.o.'s  without problems today and is being discharged home to follow up with  Dr. Clent Ridges, PA or nurse practitioner on December 27, 2008 at 1:30.  We  recommend checking a BMET at time to confirm electrolyte status with  recent nausea and vomiting.  At time of discharge, WBCs are 10.8, H and  H 13.8 an 40.  Last electrolytes checked on December 16, 2008, potassium  4.3, creatinine 0.61.  The patient states he will call the cancer center  to make followup appointment.   At time of discharge, medications include  1. Metoprolol 75 mg b.i.d., prescription provided.  2. Compazine 10 mg as needed every 6 hours p.r.n., prescription      provided.  3. Zofran 8 mg p.o.  b.i.d. p.r.n., prescription provided.   The patient is to resume previous medications including,  1. Crestor 10.  2. Zetia 10.  3. Hydrochlorothiazide 12.5.  4. Aspirin 325.  5. Klor-Con 20.  6. Amlodipine 10.   P.r.n. medications including hydrocodone, Xanax, and nitroglycerin as  needed.  Duration of discharge encountered well over 30 minutes.      Dorian Pod, ACNP      Jesse Sans. Daleen Squibb, MD, [facility]  Electronically Signed    MB/MEDQ  D:  12/18/2008  T:  12/19/2008  Job:  [id number]   cc:   Samella Parr, MD

## 2011-02-18 NOTE — Assessment & Plan Note (Signed)
[facility]                            CARDIOLOGY OFFICE NOTE   [name], [name]                    MRN:          [id number]  DATE:11/13/2008                            DOB:          July 15, 1946    [name] comes in today for followup.  Unfortunately, he fell on the  ice October 01, 2008 off his deck.  He continued to have persistent  right shoulder and arm pain.  A subsequent CT scan showed a lung  carcinoma.  Biopsy by Dr. Sherene Sires revealed non-small-cell carcinoma  consistent with adenocarcinoma.   He is being evaluated by Dr. Tyrone Sage, Dr. Edwyna Shell, the [facility] Center,  and Dr. Sherene Sires.  They need clearance from me to pursue chemotherapy,  radiation therapy, and possible surgery.   He has had no angina or ischemic symptoms.  He does have some mild  shortness of breath.   MEDICATIONS:  1. Amlodipine 10 mg per day.  2. Potassium 20 mg a day.  3. Multivitamin daily.  4. Zetia 10 mg a day.  5. Crestor 10 mg a day.  6. Metoprolol tartrate 50 mg p.o. b.i.d.  7. Alprazolam 0.5 t.i.d.  8. Hydrocodone 8 tablets 5/500 q.i.d.  9. Enteric-coated aspirin 325 mg a day.  10.HCTZ 12 mg a day.   PHYSICAL EXAMINATION:  GENERAL:  On exam today, he is in no acute  distress.  VITAL SIGNS:  His blood pressure is 128/64, his pulse 69 and regular,  weight is 152, down 4.  SKIN:  Warm and dry.  NECK:  Carotids reveal a right carotid endarterectomy scar and a bruit.  Left is normal.  Thyroid is not enlarged.  Trachea is midline.  LUNGS:  Clear to auscultation and percussion.  I could feel no  supraclavicular nodes.  HEART:  Reveals a regular rate and rhythm.  No rub, gallop, or murmur.  ABDOMEN:  Soft.  Good bowel sounds.  No midline bruit.  EXTREMITIES:  No cyanosis, clubbing, or edema.  Pulses are intact.   EKG shows normal sinus rhythm with LVH since minor strain.  There has  been no change.   ASSESSMENT AND PLAN:  [name] is stable from my  standpoint.  He can  be cleared for whatever therapy he needs.  I will see him back in 3  months.  I sincerely hope that he will get along well.     Thomas C. Daleen Squibb, MD, [facility]  Electronically Signed    TCW/MedQ  DD: 11/13/2008  DT: 11/14/2008  Job #: [id number]   cc:   Dr. Phineas Real [facility]

## 2011-02-18 NOTE — H&P (Signed)
NAME:  [name], [name] NO.:  [id number]   MEDICAL RECORD NO.:  [id number]          PATIENT TYPE:  INP   LOCATION:  2917                         FACILITY:  [facility]   PHYSICIAN:  Hillis Range, MD       DATE OF BIRTH:  Apr 18, 1946   DATE OF ADMISSION:  12/12/2008  DATE OF DISCHARGE:                              HISTORY & PHYSICAL   PRIMARY CARDIOLOGIST:  [name], MD, [facility]   HEMATOLOGIST/ONCOLOGIST:  [name]. [name], MD   CHIEF COMPLAINT:  Chest pain.   HISTORY OF PRESENT ILLNESS:  This is a 65 year old male with known  history of coronary artery disease (status post CABG in 1995 and NSTEMI  and cardiac catheterization on November 29, 2008, (with no obvious  culprit lesion recommended medical therapy), hypertension, dyslipidemia,  carotid artery disease (status post right carotid endarterectomy), and  recent diagnosis of non-small-cell lung cancer presenting with 10/10  nonradiating substernal chest pressure with shortness of breath, nausea,  vomiting, tachy palpitations, and lightheadedness waxing and waning  since 4:00 a.m. this morning.  Currently 5/10 chest pressure with BP  down from 197/83 to 179/73 and heart rate down from 113 to 93 beats per  minute.  The patient took 4 nitroglycerin during his symptoms today with  some relief.  He also received morphine in the emergency department  along with IV nitro and IV heparin, which also alleviate symptoms.  Note, the patient had his chemo and radiation yesterday and has noticed  similar symptoms post chemo radiation in the past.  Last BP check, BP  back up to 190/83.   PAST MEDICAL HISTORY:  1. Coronary artery disease, severe native vessel disease, 5/5 patent      grafts. (status post CABG 1995, status post NSTEMI November 29, 2008, showing diffuse coronary artery disease, 5/5 patent grafts,      no obvious culprit lesion recommended medical therapy).  2. Hypertension.  3. Dyslipidemia.  4. Recent  diagnosis of non-small cell lung carcinoma (treated with      chemo and radiation).  5. Status post carotid endarterectomy.   SOCIAL HISTORY:  The patient lives in [place] with his wife.  He is  retired.  He had a 40-pack-year smoking history but has not smoked at  all since 1995.  He drinks no alcohol.  He uses no illicit drugs or  herbal medications.  He has a low-sodium, heart healthy diet.  He does  no regular exercise.   FAMILY HISTORY:  Mother deceased at age 36 from myocardial infarction.  Father deceased at age 29 from lung cancer.  Although, he did have a  prior history of MI, and CABG but not premature coronary artery disease.  Siblings; 3 sisters and 3 brothers all with coronary artery disease per  the patient.   REVIEW OF SYSTEMS:  Positive for orthopnea, dyspnea on exertion, lower  extremity edema, tachy palpitations, shortness of breath, and chest  pain.  See also HPI.  All other systems reviewed and were negative.   MEDICATIONS:  1. Metoprolol 50 mg p.o. b.i.d.  2.  Amlodipine 10 mg p.o. daily.  3. Crestor 10 mg p.o. daily.  4. Zetia 10 mg p.o. daily.  5. Hydrocodone/APAP 650 mg p.o. q.6 h. p.r.n. for pain.  6. Alprazolam 1 mg p.o. t.i.d.  7. Klor-Con 20 mEq p.o. daily.  8. Hydrochlorothiazide 12.5 mg p.o. daily.  9. Hydrocodone/APAP 500 q.i.d. p.r.n. for pain.  10.Nitroglycerin 0.4 mg sublingual p.r.n. for chest pain.  11.Aspirin 325 mg p.o. daily.  12.Prochlorperazine 1-10 mg q.6 h. p.r.n.   In the emergency department, he was started on IV heparin per pharmacy.  He is given IV nitro 10 mcg per minute and 2 mg IV morphine push.   PHYSICAL EXAMINATION:  VITAL SIGNS:  T-max 97.9 degrees Fahrenheit,  current temperature 97.5 degrees Fahrenheit, pulse down from 113-93  beats per minute, BP 197/83 down to 179/73, last checked 190/83,  respiration rate 22 down to 20, O2 saturation 100% on 2 L by nasal  cannula.  GENERAL:  On exam, he is alert and oriented x3, in no  apparent  distress.  He could move and speak fairly easily without any respiratory  distress.  HEENT:  His head was normocephalic, atraumatic.  Pupils equal, round,  and reactive to light.  Extraocular muscles were intact.  Nares were  patent without discharge.  Oropharynx without erythema or exudates.  NECK:  Supple without lymphadenopathy.  No thyromegaly.  No JVD.  Positive for carotid bruits, left greater than right.  HEART:  Heart rate regular.  S1 and S2 audible.  No clicks, rubs,  murmurs, or gallops.  Pulses were 2+ and equal in upper extremities.  However, absent lower extremities pulses.  LUNGS:  Clear to auscultation bilaterally.  SKIN:  No rashes, lesions, or petechiae.  ABDOMEN:  Soft, nontender, nondistended.  Normal abdominal bowel sounds.  No rebound or guarding.  No hepatosplenomegaly and no pulsations.  EXTREMITIES:  No clubbing or cyanosis.  However, 1+ pitting edema  bilaterally in lower extremities.  MUSCULOSKELETAL:  Musculoskeletal exam revealed no joint deformities or  effusions.  There is no spinal CVA tenderness.  NEURO:  Cranial nerves  II-XII are grossly intact.  Strength 5/5 in all extremities in axial  groups, normal sensation throughout and normal cerebellar function.   RADIOLOGY:  Chest x-ray; no change in right upper lobe mass consistent  with primary lung carcinoma.  No other infiltrate or effusions.  EKG,  sinus tachycardia with a rate of 100 beats per minute, ST depression in  inferior leads and V4 through V6, minimal criteria for left ventricular  hypertrophy, normal axis, PR 148, QT 368, QTC 475, ST depression new  since EKG performed November 29, 2008.   LABORATORY DATA:  WBC 8.0, hgb 12.6, HCT 36.1, PLT count 211.  Sodium  138, potassium 3.6, chloride 105, CO2 of 20, BUN 5, creatinine 0.66,  glucose 154.  BNP 108.  First set of point-of care-markers were  negative.  Second set was troponin-I, was elevated to 0.21.   ASSESSMENT/PLAN:  This is a  65 year old male with a known history of  coronary artery disease status post CABG and recent left heart  catheterization on November 29, 2008, which revealed patent graft and  medical management was recommended.  The patient is also status post  chemo yesterday now having substernal chest pressure and nausea and  vomiting.  ST depression on EKG, initial enzymes were negative now  minimally elevated.  BP currently 190/83.  Plan is to admit to Stepdown  titrate IV nitro and IV  metoprolol to control blood pressure and  heart rate, continue to cycle cardiac enzymes to look for trend.  We  will possible cardiac catheterization with Dr. Excell Seltzer who completed his  last cardiac catheterization 2 weeks ago.  We will write order to be  notified should troponin I significantly increased from 0.21.  we will  also continue home medications and monitor closely overnight.      Jarrett Ables, [facility]      Hillis Range, MD  Electronically Signed    MS/MEDQ  D:  12/12/2008  T:  12/13/2008  Job:  [id number]

## 2011-02-18 NOTE — Assessment & Plan Note (Signed)
[facility]                            CARDIOLOGY OFFICE NOTE   JATIN, NAUMANN                   MRN:          [id number]  DATE:02/01/2008                            DOB:          September 30, 1946    [name] returns today.  He has been having all kinds of problems  since I last saw him, with leg weakness and what sounds like  inflammatory myopathy.  His Lipitor has been stopped.  He is on  Trilipex, which is a fenofibrate derivative.  He continues on Zetia.   PROBLEM LIST:  1. Coronary artery disease.  He is status post coronary bypass      grafting in 1995.  He is having no angina.  His last stress Myoview      was low risk, April 26, 2007.  2. Hyperlipidemia.  He has a low HDL by nature, and it was well      controlled with LDL 48 on Lipitor and Zetia.  He still on Zetia as      mentioned above.  3. Hypertension.  4. Severe cerebrovascular disease.  He has a totally occluded left      carotid and a right carotid endarterectomy.  Carotid Dopplers in      February of 2009 were stable.   OTHER MEDICATIONS:  His other medications include:  1. HCTZ 12 mg a day.  2. Aspirin 325 a day.  3. Amlodipine 10 mg a day.  4. Potassium 20 mg a day.  5. Multivitamin daily.  6. Again, he is on Toprol XL 135 mg daily and Zetia 10 mg a day.   EXAMINATION:  VITAL SIGNS:  Blood pressure today is 140/63, pulse 74 and  regular.  His weight is 155.  HEENT:  Ruddy complexion which is baseline, rest of his HEENT is  unchanged.  Carotid upstrokes were equal bilaterally with a right  carotid bruit.  Thyroid is not enlarged.  Trachea is midline.  LUNGS:  Clear.  HEART:  Reveals regular rate and rhythm.  Soft S1, S2.  Sternotomy site  is stable.  ABDOMEN:  Soft, good bowel sounds.  No midline bruit.  EXTREMITIES:  No sinus clubbing or edema.  Pulses were 2+/4+ bilaterally  symmetrical.  There is no sign of DVT.  His muscles are not tender.  NEURO:  Exam is  intact.   Electrocardiogram shows sinus rhythm with moderate LVH.  This is stable.   He had a long talk [name] today.  We will continue with his current  medications and will follow up lipids and LFTs in 3 weeks.  He would  like my input.  I will see him back again in about 6 weeks for close  followup.     Thomas C. Daleen Squibb, MD, [facility]  Electronically Signed    TCW/MedQ  DD: 02/01/2008  DT: 02/01/2008  Job #: [id number]   cc:   [facility] [place] [facility] Dr. Jacqulyn Bath

## 2011-02-21 NOTE — Cardiovascular Report (Signed)
NAME:  [name], [name] NO.:  [id number]   MEDICAL RECORD NO.:  [id number]          PATIENT TYPE:  OIB   LOCATION:  2899                         FACILITY:  [facility]   PHYSICIAN:  Charlies Constable, M.D. [facility] DATE OF BIRTH:  March 08, 1946   DATE OF PROCEDURE:  12/20/2004  DATE OF DISCHARGE:                              CARDIAC CATHETERIZATION   CLINICAL HISTORY:  Mr. Kimmey is 65 years old and had bypass surgery  performed in 1995.  He was seen on March 9th with chest pain that was  thought to be somewhat atypical for ischemia, and he had a Cardiolite scan,  which was severe ischemia involving the lateral wall, and he was brought in  for evaluation with angiography.   PROCEDURE:  The procedure was performed via the right femoral using an  arterial sheath and 6-French pre-formed coronary catheters.  A femoral  arterial punch was performed and Omnipaque contrast was used.  A left bypass  graft catheter was used for injection of the vein graft to the first and  second marginal branch.  A LIMA cath was used for injection of the LIMA  graft.  After completion of the diagnostic study, we made the decision to do  an intervention on the totally occluded vein graft to the first and second  marginal branches of the circumflex.  We decided to use proximal protection.   The patient was given an Angiomax bolus infusion and 300 mg of Plavix.  We  used an 8-French AL-1 guiding catheter without side-holes.  We loaded the  Proxis catheter in the device and then advanced a PT-2 light support wire  down and across the lesion.  This was the second wire that we used and there  was some difficulty crossing the lesion.  We did get a small amount of  contrast after passing the wire.  We positioned the balloon in this  position, then we inflated the Proxis device, and then we inflated the  balloon and aspirated through the Proxis device.  Repeat studies were then  performed through the guiding  catheter.  We then reinstituted proximal  protection with the Proxis device and passed a 2.5 x 20 mm Taxus stent to  the lesion, which crossed the first marginal branch.  It was reported that  this was one inflation of 10 atmospheres  for 30 seconds.  We then began  aspiration as we deflated the balloon since there was collateral flow to the  distal vessel.  We then reinstituted flow and repeated diagnostic pictures  through the guiding catheter.  We then decided to post-dilate the stent and  we reinstituted proximal protection by inflating the balloons on the Proxis  catheter and advanced a 2.75 x 12 mm Quantum Maverick into the mid and  proximal portion of the stent and inflated this to one inflation of 14  atmospheres for 20 seconds. As we were deflating the balloon, we then  aspirated through the Proxis catheter and then removed the balloon and  completed the aspiration.  Final diagnostic pictures were then taken after  removal of the Proxis catheter.  The patient tolerated the procedure well  and left the laboratory in satisfactory condition.   RESULTS:  1.  Left main coronary artery:  The left main coronary artery was totally      occluded near its origin.  There was a collateral vessel that filled the      first two marginal branches, which were filling by the occluded vein      graft.  2.  The right coronary artery was not visualized and on flush injection was      shown to be a very small, nondominant vessel on prior study.  3.  The saphenous vein graft to the third marginal and posterolateral branch      of the circumflex artery at this time was patent and functioning      normally.  4.  The venograft to the mid LAD was patent and functioning normally, and      there was no major disease in the distal LAD.  5.  The vein graft to the first and second marginal branches of the      circumflex artery was occluded in its mid portion.   LEFT VENTRICULOGRAM:  Left ventriculogram  performed in the RAO projection  showed good wall motion with no areas of hypokinesis.  The estimated  ejection fraction was 60%.   Following PTCA and stenting with proximal protection of the vein graft to  the first and second marginal branches of the circumflex artery had improved  from 100% to 0%, and the flow improved from TIMI-0 to TIMI-3 flow.   The artery pressure was 156/74 with a mean of 108.  Left ventricular  pressure was 156/13.   CONCLUSION:  1.  Coronary artery disease, status post coronary artery bypass graft      surgery in 1995.  2.  Severe native vessel disease with total occlusion of the left main      coronary artery and a small, nondominant right coronary artery.  3.  Patent vein graft to the third marginal and posterolateral branch of the      circumflex artery, patent LIMA graft to the LAD, and occluded vein graft      to the first and second marginal branches of the circumflex artery.  4.  Normal LV function with an estimated ejection fraction of 60%.  5.  Successful stenting of the totally occluded vein graft to the first and      second marginal branches of the circumflex artery using distal      protection and using a Taxus drug-eluting stent with improvement in      sentinel narrowing from 100% to 0%.   DISPOSITION:  Patient taken to the his room for further observation.      BB/MEDQ  D:  12/20/2004  T:  12/21/2004  Job:  [id number]   cc:   Lorin Picket Dr. Rochel Brome. Long  [facility] C. Wall, M.D.

## 2011-02-21 NOTE — Assessment & Plan Note (Signed)
[facility]                            CARDIOLOGY OFFICE NOTE   ROMEN, YUTZY                   MRN:          [id number]  DATE:10/12/2006                            DOB:          07-Sep-1946    Mr. Satz returns today for further management of the following  issues:  1. Coronary artery disease.  He had coronary bypass surgery in 1995.      He has normal left ventricular systolic function.  He had severe      anterolateral ischemia and had a stent placed to the second obtuse      marginal vein graft March 2006.  He has done well since then; he      has had no significant angina or dyspnea on exertion.   Stress Myoview April 17, 2006 showed an EF of 58%, minimal anterolateral  ischemia.   He has been managed with medical therapy.  He is extremely anxious of  having procedures.  We will continue to manage him medically.  1. Hyperlipidemia.  His lipids have not been checked in a while.  He      would like this done by me personally.  2. Hypertension.  3. Severe vascular disease.  He has had a right carotid      endarterectomy, his left internal carotid is totally occluded.  He      is due carotid Dopplers.  Last Doppler was November 19, 2005.  He      has no symptoms of transient ischemic attacks.   He denies any orthopnea, PND, peripheral edema, dyspnea on exertion, or  angina.   MEDICATIONS:  1. Zetia 10 mg a day.  2. Norvasc 5 mg a day.  3. Lipitor 40 mg a day.  4. HCTZ 12.5 a day.  5. Aspirin 325 a day.  6. Potassium 20 mEq a day.  7. Metoprolol 50 b.i.d.   His blood pressure today is 155/80, his pulse 77 and regular.  He is in  sinus rhythm.  There is some mild ST segment flattening inferolaterally.  His weight is 155.  HEENT:  Fairly ruddy complexion which is baseline.  PERRLA, extraocular  muscles are intact, sclerae are injected.  Facial symmetry is normal,  dentition satisfactory.  NECK:  Supple, there is no JVD, carotid  upstrokes were equal bilaterally  with a right carotid endarterectomy scar.  I could hear a soft systolic  sound on the right.  LUNGS:  Clear to auscultation and percussion.  HEART:  Reveals a nondisplaced PMI, he has normal S1, S2, there is no  gallop, there is no S4.  ABDOMINAL:  Soft with good bowel sounds, there is no midline bruit,  there is no hepatomegaly, there is no tenderness.  EXTREMITIES:  Reveal no cyanosis, clubbing, or edema.  Pulses are  present.  There is no sign of DVT or venous varicosities.  NEURO:  Intact.  SKIN:  Shows no significant ecchymoses or lesions.  MUSCULOSKELETAL:  Intact.   ASSESSMENT/PLAN:  Mr. Dusenbery is doing well.  He really has minimal  anterolateral ischemia, is a low risk scan and he  is doing well and I  would prefer to manage him medically.  He gets extremely worked up and  anxious about having any procedures.  He is due carotid Dopplers and he  would like me to do his blood work.  I will arrange for this in  February.  I have made no changes in his program.  I have renewed all  his prescriptions.     Thomas C. Daleen Squibb, MD, [facility]  Electronically Signed    TCW/MedQ  DD: 10/12/2006  DT: 10/12/2006  Job #: [id number]   cc:   Lindaann Pascal, PA

## 2011-02-21 NOTE — Discharge Summary (Signed)
NAME:  [name], [name] NO.:  [id number]   MEDICAL RECORD NO.:  [id number]          PATIENT TYPE:  OIB   LOCATION:  6532                         FACILITY:  [facility]   PHYSICIAN:  Charlies Constable, M.D. [facility] DATE OF BIRTH:  1946/06/06   DATE OF ADMISSION:  12/20/2004  DATE OF DISCHARGE:  12/21/2004                                 DISCHARGE SUMMARY   PROCEDURES:  1.  Cardiac catheterization.  2.  Coronary arteriogram.  3.  Left ventriculogram.  4.  LIMA arteriogram.  5.  Graft angiogram.  6.  PTCA and stent to one vessel.   DISCHARGE DIAGNOSES:  1.  Chest pain, Cardiolite with anterolateral ischemia, status post Taxus      stent to the saphenous vein graft to first obtuse marginal and second      obtuse marginal.  2.  Preserved left ventricular function, with an ejection fraction of 60% by      catheterization.  3.  Status post aortocoronary bypass surgery in 1995, with left internal      mammary artery to left anterior descending, saphenous vein graft to      third obtuse marginal and posterolateral, and saphenous vein graft to      first obtuse marginal and second obtuse marginal.  4.  Hypertension.  5.  Hyperlipidemia.  6.  Carotid artery disease.  7.  Musculoskeletal pain.   HOSPITAL COURSE:  [name] is a 65 year old male with a history of bypass  surgery in 1995.  He saw Dr. Daleen Squibb in the office on December 12, 2004 and had  some problems with chest pain.  His metoprolol was increased, and a  Cardiolite was scheduled.  The Cardiolite was positive for anterolateral  ischemia, and he came in for catheterization on December 20, 2004.   The cardiac catheterization showed patent grafts except for the SVG to OM1  and OM2 which was total.  His EF was normal.  He had a Taxus stent placed to  the vein graft, reducing the stenosis from 100% to 0 with TIMI III flow.   On December 21, 2004, his postprocedure cardiac enzymes were negative, and his  other labs were within normal  limits.  He was ambulating without chest pain  or shortness of breath and considered stable for discharge on December 21, 2004, with outpatient followup arranged.   DISCHARGE INSTRUCTIONS:  1.  His activity level is to include no driving for two days and no      strenuous activity for a week.  2.  He is to stick to a lowfat diet.  3.  He is to call the office for problems with the catheterization site.  4.  He follows up with Lorin Picket Long as needed and with Dr. Daleen Squibb who will call      him for an appointment.   DISCHARGE MEDICATIONS:  1.  Zetia 10 mg daily.  2.  Norvasc 5 mg daily.  3.  Lipitor 40 mg daily.  4.  HCTZ 12.5 mg daily.  5.  Xanax 0.25 mg q.i.d. p.r.n.  6.  Aspirin 325 mg daily.  7.  Metoprolol 100 mg b.i.d.  8.  Flexeril 10 mg t.i.d.  9.  Plavix 75 mg daily.  10. Nitroglycerin sublingual p.r.n.      RB/MEDQ  D:  12/21/2004  T:  12/22/2004  Job:  [id number]   cc:   Lindaann Pascal  [facility]   Jesse Sans. Wall, M.D.

## 2011-02-24 ENCOUNTER — Encounter: Payer: Self-pay | Admitting: Nurse Practitioner

## 2011-03-07 ENCOUNTER — Telehealth: Payer: Self-pay | Admitting: Cardiology

## 2011-03-07 MED ORDER — POTASSIUM CHLORIDE CRYS ER 20 MEQ PO TBCR: 20.0000 meq | EXTENDED_RELEASE_TABLET | Freq: Every day | ORAL | Status: DC

## 2011-03-07 MED ORDER — EZETIMIBE 10 MG PO TABS: 10.0000 mg | ORAL_TABLET | Freq: Every day | ORAL | Status: DC

## 2011-03-07 NOTE — Telephone Encounter (Signed)
Pharmacist states Pt needs zetia 10 mg #30 klor-con 20 mg # 30 to be call in to [facility] [phone]

## 2011-03-07 NOTE — Telephone Encounter (Signed)
rx called into pharmacy

## 2011-04-01 ENCOUNTER — Other Ambulatory Visit: Payer: Self-pay | Admitting: Cardiology

## 2011-04-03 ENCOUNTER — Ambulatory Visit (INDEPENDENT_AMBULATORY_CARE_PROVIDER_SITE_OTHER): Payer: Medicare Other | Admitting: Cardiology

## 2011-04-03 ENCOUNTER — Encounter: Payer: Self-pay | Admitting: Cardiology

## 2011-04-03 VITALS — BP 110/62 | HR 68 | Resp 14 | Ht 65.0 in | Wt 156.0 lb

## 2011-04-03 DIAGNOSIS — I219 Acute myocardial infarction, unspecified: Secondary | ICD-10-CM

## 2011-04-03 DIAGNOSIS — I25810 Atherosclerosis of coronary artery bypass graft(s) without angina pectoris: Secondary | ICD-10-CM

## 2011-04-03 DIAGNOSIS — I6529 Occlusion and stenosis of unspecified carotid artery: Secondary | ICD-10-CM

## 2011-04-03 NOTE — Assessment & Plan Note (Signed)
Stable. Continue medical therapy 

## 2011-04-03 NOTE — Progress Notes (Signed)
HPI [name] returns today for management of his coronary artery disease and carotid disease. He is having no symptoms of angina or TIAs. We reviewed this today.  He is on dual antiplatelet therapy and triple anti-hyperlipidemic therapy  Blood work being followed by primary care.  He is compliant.   Has been no recurrence of his lung cancer.  Last carotid Dopplers were stable February 2012 Past Medical History  Diagnosis Date  . Lung cancer     lung ca dx 1/10  . Hypertension   . Hyperlipidemia   . CAD (coronary artery disease)     Dr. Daleen Squibb  - Cardiologist   . Dementia   . PVD (peripheral vascular disease)   . ASCVD (arteriosclerotic cardiovascular disease)   . Anxiety     Dr. Malena Catholic    . OA (osteoarthritis)   . Neuropathic pain     Past Surgical History  Procedure Date  . Coronary artery bypass graft 7/95    x5 ([facility] )  . Carotid endarterectomy 12/98    right ([facility])  . Cardiac catheterization 12/20/04    recurrent     Family History  Problem Relation Age of Onset  . Heart attack Mother   . Heart disease Father   . Heart disease Sister   . Heart disease Brother   . Coronary artery disease Other     History   Social History  . Marital Status: Married    Spouse Name: N/A    Number of Children: N/A  . Years of Education: N/A   Occupational History  . Not on file.   Social History Main Topics  . Smoking status: Former Games developer  . Smokeless tobacco: Not on file  . Alcohol Use: No  . Drug Use:   . Sexually Active:    Other Topics Concern  . Not on file   Social History Narrative  . No narrative on file    Allergies  Allergen Reactions  . Demerol   . Clopidogrel Bisulfate   . Meperidine Hcl   . Penicillins   . Propoxyphene N-Acetaminophen     Current Outpatient Prescriptions  Medication Sig Dispense Refill  . ALPRAZolam (XANAX) 0.5 MG tablet Take 0.5 mg by mouth at bedtime as needed.        Marland Kitchen amLODipine (NORVASC) 10 MG tablet TAKE 1  TABLET EVERY DAY  30 tablet  10  . aspirin 325 MG tablet Take 325 mg by mouth daily.        Marland Kitchen ezetimibe (ZETIA) 10 MG tablet Take 1 tablet (10 mg total) by mouth daily.  30 tablet  6  . fenofibrate 160 MG tablet Take 160 mg by mouth daily.        Marland Kitchen gabapentin (NEURONTIN) 300 MG capsule 1 TAB QD       . hydrochlorothiazide (,MICROZIDE/HYDRODIURIL,) 12.5 MG capsule Take 12.5 mg by mouth daily.        . hydrocodone-acetaminophen (LORCET-HD) 5-500 MG per capsule Take 1 capsule by mouth every 6 (six) hours as needed.        . metoprolol (LOPRESSOR) 50 MG tablet Take 50 mg by mouth 2 (two) times daily.        . Multiple Vitamin (MULTIVITAMIN) capsule Take 1 capsule by mouth daily.        . nitroGLYCERIN (NITROSTAT) 0.4 MG SL tablet Place 1 tablet (0.4 mg total) under the tongue every 5 (five) minutes as needed.  25 tablet  6  . potassium chloride SA (  K-DUR,KLOR-CON) 20 MEQ tablet Take 1 tablet (20 mEq total) by mouth daily.  30 tablet  6  . prasugrel (EFFIENT) 10 MG TABS 1 tab po qd      . rosuvastatin (CRESTOR) 10 MG tablet Take 10 mg by mouth daily.          ROS Negative other than HPI.   PE General Appearance: well developed, well nourished in no acute distress HEENT: symmetrical face, PERRLA, good dentition  Neck: no JVD, thyromegaly, or adenopathy, trachea midline Chest: symmetric without deformity Cardiac: PMI non-displaced, RRR, normal S1, S2, no gallop or murmur Lung: clear to ausculation and percussion Vascular: bilateral bruits right greater than left, diminished pulses in the feet but present Abdominal: nondistended, nontender, good bowel sounds, no HSM, no bruits Extremities: no cyanosis, clubbing or edema, no sign of DVT, no varicosities  Skin: normal color, no rashes Neuro: alert and oriented x 3, non-focal Pysch: normal affect Filed Vitals:   04/03/11 1027  BP: 110/62  Pulse: 68  Resp: 14  Height: 5\' 5"  (1.651 m)  Weight: 156 lb (70.761 kg)    EKG  Labs and Studies  Reviewed.   Lab Results  Component Value Date   WBC 5.8 12/04/2010   HGB 13.7 12/04/2010   HCT 39.8 12/04/2010   MCV 88.5 12/04/2010   PLT 125.0* 12/04/2010      Chemistry      Component Value Date/Time   NA 138 11/19/2010 0540   K 3.9 11/19/2010 0540   CL 106 11/19/2010 0540   CO2 27 11/19/2010 0540   BUN 9 11/19/2010 0540   CREATININE 0.72 11/19/2010 0540      Component Value Date/Time   CALCIUM 8.9 11/19/2010 0540   ALKPHOS 73 11/14/2010 0730   AST 24 11/14/2010 0730   ALT 19 11/14/2010 0730   BILITOT 0.7 11/14/2010 0730       Lab Results  Component Value Date   CHOL  Value: 115        ATP III CLASSIFICATION:  <200     mg/dL   Desirable  161-096  mg/dL   Borderline High  >=045    mg/dL   High        01/12/8118   CHOL 135 07/19/2007   CHOL 118 11/19/2006   Lab Results  Component Value Date   HDL 35* 11/16/2010   HDL 29.2* 07/19/2007   HDL 30.7* 11/19/2006   Lab Results  Component Value Date   LDLCALC  Value: 53        Total Cholesterol/HDL:CHD Risk Coronary Heart Disease Risk Table                     Men   Women  1/2 Average Risk   3.4   3.3  Average Risk       5.0   4.4  2 X Average Risk   9.6   7.1  3 X Average Risk  23.4   11.0        Use the calculated Patient Ratio above and the CHD Risk Table to determine the patient's CHD Risk.        ATP III CLASSIFICATION (LDL):  <100     mg/dL   Optimal  147-829  mg/dL   Near or Above                    Optimal  130-159  mg/dL   Borderline  562-130  mg/dL   High  >  190     mg/dL   Very High 1/61/0960   LDLCALC 78 07/19/2007   LDLCALC 48 11/19/2006   Lab Results  Component Value Date   TRIG 135 11/16/2010   TRIG 140 07/19/2007   TRIG 197* 11/19/2006   Lab Results  Component Value Date   CHOLHDL 3.3 11/16/2010   CHOLHDL 4.6 CALC 07/19/2007   CHOLHDL 3.8 CALC 11/19/2006   Lab Results  Component Value Date   HGBA1C  Value: 5.3 (NOTE)   The ADA recommends the following therapeutic goal for glycemic   control related to Hgb A1C measurement:    Goal of Therapy:   < 7.0% Hgb A1C   Reference: American Diabetes Association: Clinical Practice   Recommendations 2008, Diabetes Care,  2008, 31:(Suppl 1). 11/28/2008   Lab Results  Component Value Date   ALT 19 11/14/2010   AST 24 11/14/2010   ALKPHOS 73 11/14/2010   BILITOT 0.7 11/14/2010   Lab Results  Component Value Date   TSH 0.123 **Test methodology is 3rd generation TSH** 11/28/2008

## 2011-04-03 NOTE — Patient Instructions (Signed)
Your physician recommends that you schedule a follow-up appointment in: 6 months with Dr. Wall  

## 2011-04-03 NOTE — Assessment & Plan Note (Signed)
Stable. Continue medical therapy repeat carotids in 6 months when he returns for followup.

## 2011-07-11 LAB — COMPREHENSIVE METABOLIC PANEL
ALT: 22 U/L (ref 0–53)
Alkaline Phosphatase: 116 U/L (ref 39–117)
BUN: 6 mg/dL (ref 6–23)
CO2: 23 mEq/L (ref 19–32)
Calcium: 9.2 mg/dL (ref 8.4–10.5)
GFR calc non Af Amer: >60 mL/min (ref >60)
Glucose, Bld: 110 mg/dL — ABNORMAL HIGH (ref 70–99)
Total Protein: 8.1 g/dL (ref 6.0–8.3)

## 2011-07-11 LAB — CBC
MCHC: 33.1 g/dL (ref 30.0–36.0)
MCV: 82.2 fL (ref 78.0–100.0)
RDW: 14.9 % (ref 11.5–15.5)

## 2011-07-11 LAB — DIFFERENTIAL
Basophils Relative: 1 % (ref 0–1)
Eosinophils Absolute: 0.1 10*3/uL (ref 0.0–0.7)
Lymphs Abs: 0.8 10*3/uL (ref 0.7–4.0)
Monocytes Relative: 5 % (ref 3–12)
Neutro Abs: 6 10*3/uL (ref 1.7–7.7)
Neutrophils Relative %: 84 % — ABNORMAL HIGH (ref 43–77)

## 2011-07-11 LAB — CK TOTAL AND CKMB (NOT AT ARMC)
Relative Index: INVALID (ref 0.0–2.5)
Total CK: 50 U/L (ref 7–232)

## 2011-07-11 LAB — TROPONIN I: Troponin I: 0.01 ng/mL (ref 0.00–0.06)

## 2011-07-11 LAB — PROTIME-INR
INR: 0.9 (ref 0.00–1.49)
Prothrombin Time: 12.6 seconds (ref 11.6–15.2)

## 2011-07-11 LAB — APTT: aPTT: 31 seconds (ref 24–37)

## 2011-07-11 LAB — LIPID PANEL: VLDL: 54 mg/dL — ABNORMAL HIGH (ref 0–40)

## 2011-09-23 ENCOUNTER — Ambulatory Visit: Payer: Medicare Other | Admitting: Cardiology

## 2011-09-24 ENCOUNTER — Ambulatory Visit (INDEPENDENT_AMBULATORY_CARE_PROVIDER_SITE_OTHER): Payer: Medicare Other | Admitting: Cardiology

## 2011-09-24 ENCOUNTER — Encounter: Payer: Self-pay | Admitting: Cardiology

## 2011-09-24 VITALS — BP 142/58 | HR 66 | Resp 18 | Ht 65.0 in | Wt 155.0 lb

## 2011-09-24 DIAGNOSIS — R079 Chest pain, unspecified: Secondary | ICD-10-CM

## 2011-09-24 DIAGNOSIS — I6529 Occlusion and stenosis of unspecified carotid artery: Secondary | ICD-10-CM

## 2011-09-24 DIAGNOSIS — E785 Hyperlipidemia, unspecified: Secondary | ICD-10-CM

## 2011-09-24 DIAGNOSIS — I2510 Atherosclerotic heart disease of native coronary artery without angina pectoris: Secondary | ICD-10-CM

## 2011-09-24 DIAGNOSIS — I25810 Atherosclerosis of coronary artery bypass graft(s) without angina pectoris: Secondary | ICD-10-CM

## 2011-09-24 DIAGNOSIS — I219 Acute myocardial infarction, unspecified: Secondary | ICD-10-CM

## 2011-09-24 NOTE — Assessment & Plan Note (Signed)
Stable. His noncardiac chest pain. Patient reassured and symptoms of angina and use of nitroglycerin given.

## 2011-09-24 NOTE — Assessment & Plan Note (Signed)
Asymptomatic. Arrange follow carotid Dopplers.

## 2011-09-24 NOTE — Progress Notes (Signed)
HPI [name] comes in today for further evaluation and management of his coronary disease, history bypass surgery, carotid artery disease, and history of atypical chest pain.  He's been having some very well localized lower substernal sharp pain that lasts for seconds. He denies hemoptysis, cough, fever, chills, or pleuritic chest pain.  Is a history of lung cancer and has a checkup in January for oncology. He has been told and stable.  He denies orthopnea, PND or edema.    Past Medical History  Diagnosis Date  . Lung cancer     lung ca dx 1/10  . Hypertension   . Hyperlipidemia   . CAD (coronary artery disease)     Dr. Daleen Squibb  - Cardiologist   . Dementia   . PVD (peripheral vascular disease)   . ASCVD (arteriosclerotic cardiovascular disease)   . Anxiety     Dr. Malena Catholic    . OA (osteoarthritis)   . Neuropathic pain     Current Outpatient Prescriptions  Medication Sig Dispense Refill  . ALPRAZolam (XANAX) 0.5 MG tablet Take 0.5 mg by mouth at bedtime as needed.        Marland Kitchen amLODipine (NORVASC) 10 MG tablet TAKE 1 TABLET EVERY DAY  30 tablet  10  . aspirin 325 MG tablet Take 325 mg by mouth daily.        Marland Kitchen ezetimibe (ZETIA) 10 MG tablet Take 1 tablet (10 mg total) by mouth daily.  30 tablet  6  . fenofibrate 160 MG tablet Take 160 mg by mouth daily.        Marland Kitchen gabapentin (NEURONTIN) 300 MG capsule 1 TAB QD       . hydrochlorothiazide (,MICROZIDE/HYDRODIURIL,) 12.5 MG capsule Take 12.5 mg by mouth daily.        . hydrocodone-acetaminophen (LORCET-HD) 5-500 MG per capsule Take 1 capsule by mouth every 6 (six) hours as needed.        . metoprolol (LOPRESSOR) 50 MG tablet Take 50 mg by mouth 2 (two) times daily.        . Multiple Vitamin (MULTIVITAMIN) capsule Take 1 capsule by mouth daily.        . nitroGLYCERIN (NITROSTAT) 0.4 MG SL tablet Place 1 tablet (0.4 mg total) under the tongue every 5 (five) minutes as needed.  25 tablet  6  . potassium chloride SA (K-DUR,KLOR-CON) 20 MEQ  tablet Take 1 tablet (20 mEq total) by mouth daily.  30 tablet  6  . prasugrel (EFFIENT) 10 MG TABS 1 tab po qd      . rosuvastatin (CRESTOR) 10 MG tablet Take 10 mg by mouth daily.        . valACYclovir (VALTREX) 1000 MG tablet Take 1,000 mg by mouth 3 (three) times daily.          Allergies  Allergen Reactions  . Demerol   . Clopidogrel Bisulfate   . Meperidine Hcl   . Penicillins   . Propoxyphene N-Acetaminophen     Family History  Problem Relation Age of Onset  . Heart attack Mother   . Heart disease Father   . Heart disease Sister   . Heart disease Brother   . Coronary artery disease Other     History   Social History  . Marital Status: Married    Spouse Name: N/A    Number of Children: N/A  . Years of Education: N/A   Occupational History  . Not on file.   Social History Main Topics  .  Smoking status: Former Games developer  . Smokeless tobacco: Not on file  . Alcohol Use: No  . Drug Use:   . Sexually Active:    Other Topics Concern  . Not on file   Social History Narrative  . No narrative on file    ROS ALL NEGATIVE EXCEPT THOSE NOTED IN HPI  PE  General Appearance: well developed, well nourished in no acute distress, anxious,  HEENT: symmetrical face, PERRLA, good dentition  Neck: no JVD, thyromegaly, or adenopathy, trachea midline Chest: symmetric without deformity Cardiac: PMI non-displaced, RRR, normal S1, S2, no gallop or murmur, no rub Lung: clear to ausculation and percussion, no rub or Vascular: all pulses full without bruits  Abdominal: nondistended, nontender, good bowel sounds, no HSM, no bruits Extremities: no cyanosis, clubbing or edema, no sign of DVT, no varicosities  Skin: normal color, no rashes Neuro: alert and oriented x 3, non-focal Pysch: normal affect  EKG Not repeated. BMET    Component Value Date/Time   NA 138 11/19/2010 0540   K 3.9 11/19/2010 0540   CL 106 11/19/2010 0540   CO2 27 11/19/2010 0540   GLUCOSE 102* 11/19/2010  0540   BUN 9 11/19/2010 0540   CREATININE 0.72 11/19/2010 0540   CALCIUM 8.9 11/19/2010 0540   GFRNONAA >60 11/19/2010 0540   GFRAA  Value: >60        The eGFR has been calculated using the MDRD equation. This calculation has not been validated in all clinical situations. eGFR's persistently <60 mL/min signify possible Chronic Kidney Disease. 11/19/2010 0540    Lipid Panel     Component Value Date/Time   CHOL  Value: 115        ATP III CLASSIFICATION:  <200     mg/dL   Desirable  161-096  mg/dL   Borderline High  >=045    mg/dL   High        01/12/8118 0400   TRIG 135 11/16/2010 0400   HDL 35* 11/16/2010 0400   CHOLHDL 3.3 11/16/2010 0400   VLDL 27 11/16/2010 0400   LDLCALC  Value: 53        Total Cholesterol/HDL:CHD Risk Coronary Heart Disease Risk Table                     Men   Women  1/2 Average Risk   3.4   3.3  Average Risk       5.0   4.4  2 X Average Risk   9.6   7.1  3 X Average Risk  23.4   11.0        Use the calculated Patient Ratio above and the CHD Risk Table to determine the patient's CHD Risk.        ATP III CLASSIFICATION (LDL):  <100     mg/dL   Optimal  147-829  mg/dL   Near or Above                    Optimal  130-159  mg/dL   Borderline  562-130  mg/dL   High  >865     mg/dL   Very High 7/84/6962 9528    CBC    Component Value Date/Time   WBC 5.8 12/04/2010 1050   WBC 7.5 01/01/2009 1126   RBC 4.49 12/04/2010 1050   RBC 4.14* 01/01/2009 1126   HGB 13.7 12/04/2010 1050   HGB 11.8* 01/01/2009 1126   HCT 39.8 12/04/2010 1050  HCT 34.2* 01/01/2009 1126   PLT 125.0* 12/04/2010 1050   PLT 183 01/01/2009 1126   MCV 88.5 12/04/2010 1050   MCV 82.7 01/01/2009 1126   MCH 29.4 11/19/2010 0540   MCH 28.5 01/01/2009 1126   MCHC 34.3 12/04/2010 1050   MCHC 34.5 01/01/2009 1126   RDW 13.9 12/04/2010 1050   RDW 18.0* 01/01/2009 1126   LYMPHSABS 0.8 12/04/2010 1050   LYMPHSABS 0.7* 01/01/2009 1126   MONOABS 0.5 12/04/2010 1050   MONOABS 0.5 01/01/2009 1126   EOSABS 0.1 12/04/2010 1050   EOSABS 0.0  01/01/2009 1126   BASOSABS 0.0 12/04/2010 1050   BASOSABS 0.0 01/01/2009 1126

## 2011-09-24 NOTE — Assessment & Plan Note (Signed)
Looking at his recent numbers, he does not need Zetia. We'll discontinue. He'll also save money.

## 2011-09-24 NOTE — Patient Instructions (Addendum)
Your physician wants you to follow-up in: 6 months.  You will receive a reminder letter in the mail two months in advance. If you don't receive a letter, please call our office to schedule the follow-up appointment.  Your physician has requested that you have a carotid duplex. This test is an ultrasound of the carotid arteries in your neck. It looks at blood flow through these arteries that supply the brain with blood. Allow one hour for this exam. There are no restrictions or special instructions.  This is scheduled for 10/09/2011 at 1pm.   Your physician has recommended you make the following change in your medication: Stop ZETIA.

## 2011-10-02 ENCOUNTER — Other Ambulatory Visit: Payer: Self-pay | Admitting: Cardiology

## 2011-10-06 ENCOUNTER — Other Ambulatory Visit: Payer: Self-pay | Admitting: *Deleted

## 2011-10-06 DIAGNOSIS — I6529 Occlusion and stenosis of unspecified carotid artery: Secondary | ICD-10-CM

## 2011-10-09 ENCOUNTER — Encounter (INDEPENDENT_AMBULATORY_CARE_PROVIDER_SITE_OTHER): Payer: Medicare Other | Admitting: *Deleted

## 2011-10-09 DIAGNOSIS — I6529 Occlusion and stenosis of unspecified carotid artery: Secondary | ICD-10-CM | POA: Diagnosis not present

## 2011-11-04 DIAGNOSIS — J40 Bronchitis, not specified as acute or chronic: Secondary | ICD-10-CM | POA: Diagnosis not present

## 2011-11-04 DIAGNOSIS — F411 Generalized anxiety disorder: Secondary | ICD-10-CM | POA: Diagnosis not present

## 2011-11-13 DIAGNOSIS — I10 Essential (primary) hypertension: Secondary | ICD-10-CM | POA: Diagnosis not present

## 2011-11-13 DIAGNOSIS — E785 Hyperlipidemia, unspecified: Secondary | ICD-10-CM | POA: Diagnosis not present

## 2011-11-13 DIAGNOSIS — E876 Hypokalemia: Secondary | ICD-10-CM | POA: Diagnosis not present

## 2011-12-09 DIAGNOSIS — M545 Low back pain: Secondary | ICD-10-CM | POA: Diagnosis not present

## 2011-12-09 DIAGNOSIS — F411 Generalized anxiety disorder: Secondary | ICD-10-CM | POA: Diagnosis not present

## 2012-01-06 DIAGNOSIS — M545 Low back pain: Secondary | ICD-10-CM | POA: Diagnosis not present

## 2012-01-06 DIAGNOSIS — F411 Generalized anxiety disorder: Secondary | ICD-10-CM | POA: Diagnosis not present

## 2012-02-02 ENCOUNTER — Other Ambulatory Visit: Payer: Self-pay | Admitting: Cardiology

## 2012-02-02 ENCOUNTER — Other Ambulatory Visit: Payer: Self-pay

## 2012-02-02 MED ORDER — AMLODIPINE BESYLATE 10 MG PO TABS: 10.0000 mg | ORAL_TABLET | Freq: Every day | ORAL | Status: DC

## 2012-03-02 ENCOUNTER — Other Ambulatory Visit: Payer: Self-pay | Admitting: Cardiology

## 2012-03-08 DIAGNOSIS — I739 Peripheral vascular disease, unspecified: Secondary | ICD-10-CM | POA: Diagnosis not present

## 2012-03-08 DIAGNOSIS — Z125 Encounter for screening for malignant neoplasm of prostate: Secondary | ICD-10-CM | POA: Diagnosis not present

## 2012-03-08 DIAGNOSIS — I10 Essential (primary) hypertension: Secondary | ICD-10-CM | POA: Diagnosis not present

## 2012-03-08 DIAGNOSIS — I2510 Atherosclerotic heart disease of native coronary artery without angina pectoris: Secondary | ICD-10-CM | POA: Diagnosis not present

## 2012-03-08 DIAGNOSIS — E785 Hyperlipidemia, unspecified: Secondary | ICD-10-CM | POA: Diagnosis not present

## 2012-03-08 DIAGNOSIS — E559 Vitamin D deficiency, unspecified: Secondary | ICD-10-CM | POA: Diagnosis not present

## 2012-03-30 ENCOUNTER — Other Ambulatory Visit: Payer: Self-pay | Admitting: Cardiology

## 2012-04-28 ENCOUNTER — Other Ambulatory Visit: Payer: Self-pay | Admitting: Cardiology

## 2012-04-28 ENCOUNTER — Telehealth: Payer: Self-pay | Admitting: *Deleted

## 2012-04-28 NOTE — Telephone Encounter (Signed)
LMTCB & to schedule appt with Dr. Daleen Squibb

## 2012-04-28 NOTE — Telephone Encounter (Signed)
LMTCB to schedule 6 month appt with Dr. Daleen Squibb & if pt is still taking potassium daily Mylo Red RN

## 2012-04-28 NOTE — Telephone Encounter (Signed)
.   Requested Prescriptions   Signed Prescriptions Disp Refills  . KLOR-CON M20 20 MEQ tablet 30 tablet 6    Sig: TAKE 1 TABLET BY MOUTH DAILY    Authorizing Provider: WALL, THOMAS C    Ordering User: Lacie Scotts

## 2012-05-10 DIAGNOSIS — E039 Hypothyroidism, unspecified: Secondary | ICD-10-CM | POA: Diagnosis not present

## 2012-05-10 DIAGNOSIS — F411 Generalized anxiety disorder: Secondary | ICD-10-CM | POA: Diagnosis not present

## 2012-06-09 ENCOUNTER — Ambulatory Visit (INDEPENDENT_AMBULATORY_CARE_PROVIDER_SITE_OTHER): Payer: Medicare Other | Admitting: Cardiology

## 2012-06-09 ENCOUNTER — Encounter: Payer: Self-pay | Admitting: Cardiology

## 2012-06-09 VITALS — BP 144/55 | HR 75 | Ht 65.0 in | Wt 154.1 lb

## 2012-06-09 DIAGNOSIS — I6529 Occlusion and stenosis of unspecified carotid artery: Secondary | ICD-10-CM

## 2012-06-09 DIAGNOSIS — I219 Acute myocardial infarction, unspecified: Secondary | ICD-10-CM

## 2012-06-09 DIAGNOSIS — R0989 Other specified symptoms and signs involving the circulatory and respiratory systems: Secondary | ICD-10-CM

## 2012-06-09 DIAGNOSIS — E785 Hyperlipidemia, unspecified: Secondary | ICD-10-CM

## 2012-06-09 DIAGNOSIS — I10 Essential (primary) hypertension: Secondary | ICD-10-CM | POA: Diagnosis not present

## 2012-06-09 DIAGNOSIS — I25810 Atherosclerosis of coronary artery bypass graft(s) without angina pectoris: Secondary | ICD-10-CM

## 2012-06-09 MED ORDER — AMLODIPINE BESYLATE 10 MG PO TABS: 10.0000 mg | ORAL_TABLET | Freq: Every day | ORAL | Status: DC

## 2012-06-09 MED ORDER — METOPROLOL TARTRATE 50 MG PO TABS: 50.0000 mg | ORAL_TABLET | Freq: Two times a day (BID) | ORAL | Status: DC

## 2012-06-09 MED ORDER — NITROGLYCERIN 0.4 MG SL SUBL: 0.4000 mg | SUBLINGUAL_TABLET | SUBLINGUAL | Status: DC | PRN

## 2012-06-09 NOTE — Progress Notes (Signed)
HPI [name] returns today for evaluation and management of his coronary disease.  He has some atypical chest discomfort episodically response to 4 baby aspirin. He does not respond to nitroglycerin. It does not radiate it is not going to the arms. It is not clearly related to exertion. He denies any other symptoms including nausea or diaphoresis.  He is very compliant with medications. His lung cancer is in remission. He also has his blood work routinely checked by Dr. Christell Constant.  Past Medical History  Diagnosis Date  . Lung cancer     lung ca dx 1/10  . Hypertension   . Hyperlipidemia   . CAD (coronary artery disease)     Dr. Daleen Squibb  - Cardiologist   . Dementia   . PVD (peripheral vascular disease)   . ASCVD (arteriosclerotic cardiovascular disease)   . Anxiety     Dr. Malena Catholic    . OA (osteoarthritis)   . Neuropathic pain     Current Outpatient Prescriptions  Medication Sig Dispense Refill  . ALPRAZolam (XANAX) 0.5 MG tablet Take 0.5 mg by mouth at bedtime as needed.        Marland Kitchen amLODipine (NORVASC) 10 MG tablet Take 1 tablet (10 mg total) by mouth daily.  90 tablet  3  . aspirin 325 MG tablet Take 325 mg by mouth daily.        . fenofibrate 160 MG tablet Take 160 mg by mouth daily.        Marland Kitchen gabapentin (NEURONTIN) 300 MG capsule 1 TAB QD       . hydrochlorothiazide (MICROZIDE) 12.5 MG capsule TAKE 1 CAPSULE EVERY DAY  30 capsule  5  . hydrocodone-acetaminophen (LORCET-HD) 5-500 MG per capsule Take 1 capsule by mouth every 6 (six) hours as needed.        Marland Kitchen KLOR-CON M20 20 MEQ tablet TAKE 1 TABLET BY MOUTH DAILY  30 tablet  6  . metoprolol (LOPRESSOR) 50 MG tablet Take 1 tablet (50 mg total) by mouth 2 (two) times daily.  180 tablet  3  . Multiple Vitamin (MULTIVITAMIN) capsule Take 1 capsule by mouth daily.        . nitroGLYCERIN (NITROSTAT) 0.4 MG SL tablet Place 1 tablet (0.4 mg total) under the tongue every 5 (five) minutes as needed.  25 tablet  11  . prasugrel (EFFIENT) 10 MG TABS  1 tab po qd      . rosuvastatin (CRESTOR) 10 MG tablet Take 10 mg by mouth daily.        Marland Kitchen DISCONTD: amLODipine (NORVASC) 10 MG tablet Take 1 tablet (10 mg total) by mouth daily.  30 tablet  2  . DISCONTD: metoprolol (LOPRESSOR) 50 MG tablet Take 50 mg by mouth 2 (two) times daily.        Marland Kitchen DISCONTD: nitroGLYCERIN (NITROSTAT) 0.4 MG SL tablet Place 1 tablet (0.4 mg total) under the tongue every 5 (five) minutes as needed.  25 tablet  6  . DISCONTD: amLODipine (NORVASC) 10 MG tablet TAKE 1 TABLET EVERY DAY  30 tablet  10    Allergies  Allergen Reactions  . Demerol   . Clopidogrel Bisulfate   . Meperidine Hcl   . Penicillins   . Propoxyphene-Acetaminophen     Family History  Problem Relation Age of Onset  . Heart attack Mother   . Heart disease Father   . Heart disease Sister   . Heart disease Brother   . Coronary artery disease Other  History   Social History  . Marital Status: Married    Spouse Name: N/A    Number of Children: N/A  . Years of Education: N/A   Occupational History  . Not on file.   Social History Main Topics  . Smoking status: Former Games developer  . Smokeless tobacco: Not on file  . Alcohol Use: No  . Drug Use:   . Sexually Active:    Other Topics Concern  . Not on file   Social History Narrative  . No narrative on file    ROS ALL NEGATIVE EXCEPT THOSE NOTED IN HPI  PE  General Appearance: well developed, well nourished in no acute distress HEENT: symmetrical face, PERRLA,  Neck: no JVD, thyromegaly, or adenopathy, trachea midline Chest: symmetric without deformity Cardiac: PMI non-displaced, RRR, normal S1, S2, no gallop or murmur Lung: clear to ausculation and percussion Vascular: all pulses full, bilateral carotid bruits Abdominal: nondistended, nontender, good bowel sounds, no HSM, no bruits Extremities: no cyanosis, clubbing or edema, no sign of DVT, no varicosities  Skin: normal color, no rashes Neuro: alert and oriented x 3,  non-focal Pysch: normal affect  EKG Normal sinus rhythm, ST-T wave changes inferior laterally, no acute changes. BMET    Component Value Date/Time   NA 138 11/19/2010 0540   K 3.9 11/19/2010 0540   CL 106 11/19/2010 0540   CO2 27 11/19/2010 0540   GLUCOSE 102* 11/19/2010 0540   BUN 9 11/19/2010 0540   CREATININE 0.72 11/19/2010 0540   CALCIUM 8.9 11/19/2010 0540   GFRNONAA >60 11/19/2010 0540   GFRAA  Value: >60        The eGFR has been calculated using the MDRD equation. This calculation has not been validated in all clinical situations. eGFR's persistently <60 mL/min signify possible Chronic Kidney Disease. 11/19/2010 0540    Lipid Panel     Component Value Date/Time   CHOL  Value: 115        ATP III CLASSIFICATION:  <200     mg/dL   Desirable  161-096  mg/dL   Borderline High  >=045    mg/dL   High        01/12/8118 0400   TRIG 135 11/16/2010 0400   HDL 35* 11/16/2010 0400   CHOLHDL 3.3 11/16/2010 0400   VLDL 27 11/16/2010 0400   LDLCALC  Value: 53        Total Cholesterol/HDL:CHD Risk Coronary Heart Disease Risk Table                     Men   Women  1/2 Average Risk   3.4   3.3  Average Risk       5.0   4.4  2 X Average Risk   9.6   7.1  3 X Average Risk  23.4   11.0        Use the calculated Patient Ratio above and the CHD Risk Table to determine the patient's CHD Risk.        ATP III CLASSIFICATION (LDL):  <100     mg/dL   Optimal  147-829  mg/dL   Near or Above                    Optimal  130-159  mg/dL   Borderline  562-130  mg/dL   High  >865     mg/dL   Very High 7/84/6962 9528    CBC    Component  Value Date/Time   WBC 5.8 12/04/2010 1050   WBC 7.5 01/01/2009 1126   RBC 4.49 12/04/2010 1050   RBC 4.14* 01/01/2009 1126   HGB 13.7 12/04/2010 1050   HGB 11.8* 01/01/2009 1126   HCT 39.8 12/04/2010 1050   HCT 34.2* 01/01/2009 1126   PLT 125.0* 12/04/2010 1050   PLT 183 01/01/2009 1126   MCV 88.5 12/04/2010 1050   MCV 82.7 01/01/2009 1126   MCH 29.4 11/19/2010 0540   MCH 28.5 01/01/2009 1126     MCHC 34.3 12/04/2010 1050   MCHC 34.5 01/01/2009 1126   RDW 13.9 12/04/2010 1050   RDW 18.0* 01/01/2009 1126   LYMPHSABS 0.8 12/04/2010 1050   LYMPHSABS 0.7* 01/01/2009 1126   MONOABS 0.5 12/04/2010 1050   MONOABS 0.5 01/01/2009 1126   EOSABS 0.1 12/04/2010 1050   EOSABS 0.0 01/01/2009 1126   BASOSABS 0.0 12/04/2010 1050   BASOSABS 0.0 01/01/2009 1126

## 2012-06-09 NOTE — Assessment & Plan Note (Signed)
Stable clinically. Carotid Dopplers 2 in February 2014. Continue secondary preventative therapy.

## 2012-06-09 NOTE — Assessment & Plan Note (Signed)
I do not think his symptoms are cardiac. Continue secondary preventative therapy. Use of nitroglycerin once again reviewed. When to activate 911 also reviewed.

## 2012-06-09 NOTE — Patient Instructions (Addendum)
Your physician recommends that you continue on your current medications as directed. Please refer to the Current Medication list given to you today.  Your physician has requested that you have a carotid duplex in February 2014. This test is an ultrasound of the carotid arteries in your neck. It looks at blood flow through these arteries that supply the brain with blood. Allow one hour for this exam. There are no restrictions or special instructions.   Your physician wants you to follow-up in: 1 year with Dr. Daleen Squibb. You will receive a reminder letter in the mail two months in advance. If you don't receive a letter, please call our office to schedule the follow-up appointment.

## 2012-06-25 DIAGNOSIS — Z23 Encounter for immunization: Secondary | ICD-10-CM | POA: Diagnosis not present

## 2012-06-25 DIAGNOSIS — M545 Low back pain, unspecified: Secondary | ICD-10-CM | POA: Diagnosis not present

## 2012-06-25 DIAGNOSIS — E785 Hyperlipidemia, unspecified: Secondary | ICD-10-CM | POA: Diagnosis not present

## 2012-06-25 DIAGNOSIS — F411 Generalized anxiety disorder: Secondary | ICD-10-CM | POA: Diagnosis not present

## 2012-06-25 DIAGNOSIS — E559 Vitamin D deficiency, unspecified: Secondary | ICD-10-CM | POA: Diagnosis not present

## 2012-06-25 DIAGNOSIS — I10 Essential (primary) hypertension: Secondary | ICD-10-CM | POA: Diagnosis not present

## 2012-07-23 ENCOUNTER — Ambulatory Visit
Admission: RE | Admit: 2012-07-23 | Discharge: 2012-07-23 | Disposition: A | Discharge status: Home / Self Care | Payer: Medicare Other | Source: Ambulatory Visit | Attending: Radiation Oncology | Admitting: Radiation Oncology

## 2012-07-23 ENCOUNTER — Encounter: Payer: Self-pay | Admitting: Radiation Oncology

## 2012-07-23 VITALS — BP 144/57 | HR 69 | Temp 97.6°F | Resp 18 | Wt 154.5 lb

## 2012-07-23 DIAGNOSIS — I2510 Atherosclerotic heart disease of native coronary artery without angina pectoris: Secondary | ICD-10-CM | POA: Insufficient documentation

## 2012-07-23 DIAGNOSIS — C3411 Malignant neoplasm of upper lobe, right bronchus or lung: Secondary | ICD-10-CM | POA: Insufficient documentation

## 2012-07-23 DIAGNOSIS — Z79899 Other long term (current) drug therapy: Secondary | ICD-10-CM | POA: Diagnosis not present

## 2012-07-23 DIAGNOSIS — C3410 Malignant neoplasm of upper lobe, unspecified bronchus or lung: Secondary | ICD-10-CM

## 2012-07-23 DIAGNOSIS — Z7901 Long term (current) use of anticoagulants: Secondary | ICD-10-CM | POA: Diagnosis not present

## 2012-07-23 LAB — BUN AND CREATININE (CC13): BUN: 12 mg/dL (ref 7.0–26.0)

## 2012-07-23 NOTE — Progress Notes (Signed)
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  He missed a followup with me last February due to being in the hospital and having 2 more stents placed. He has not had a CT scan in over a year. He is doing well. He has concerns regarding losing all of his teeth. He would like to get the remainder of his teeth pulled and be fitted for dentures. He is on anticoagulation secondary to his cardiovascular disease. This has been a concern from his primary dentist. He believes Dr. Anola Gurney would allow him to come off his anticoagulation for for 5 days. He's not having any chest pain. His wife has noted some personality changes in memory difficulties and was concerned about brain metastases. He denies any headache or focal numbness or weakness. His breathing is about stable. He would like to increase his exercise tolerance.  Allergies:  Allergies  Allergen Reactions  . Demerol   . Clopidogrel Bisulfate   . Meperidine Hcl   . Penicillins   . Propoxyphene-Acetaminophen     Medications:  Current Outpatient Prescriptions  Medication Sig Dispense Refill  . ALPRAZolam (XANAX) 0.5 MG tablet Take 0.5 mg by mouth at bedtime as needed.        Marland Kitchen amLODipine (NORVASC) 10 MG tablet Take 1 tablet (10 mg total) by mouth daily.  90 tablet  3  . aspirin 325 MG tablet Take 325 mg by mouth daily.        . fenofibrate 160 MG tablet Take 160 mg by mouth daily.        Marland Kitchen gabapentin (NEURONTIN) 300 MG capsule 1 TAB QD       . hydrochlorothiazide (MICROZIDE) 12.5 MG capsule TAKE 1 CAPSULE EVERY DAY  30 capsule  5  . hydrocodone-acetaminophen (LORCET-HD) 5-500 MG per capsule Take 1 capsule by mouth every 6 (six)  hours as needed.        Marland Kitchen KLOR-CON M20 20 MEQ tablet TAKE 1 TABLET BY MOUTH DAILY  30 tablet  6  . metoprolol (LOPRESSOR) 50 MG tablet Take 1 tablet (50 mg total) by mouth 2 (two) times daily.  180 tablet  3  . Multiple Vitamin (MULTIVITAMIN) capsule Take 1 capsule by mouth daily.        . nitroGLYCERIN (NITROSTAT) 0.4 MG SL tablet Place 1 tablet (0.4 mg total) under the tongue every 5 (five) minutes as needed.  25 tablet  11  . prasugrel (EFFIENT) 10 MG TABS 1 tab po qd      . rosuvastatin (CRESTOR) 10 MG tablet Take 20 mg by mouth daily.         Physical Exam:   weight is 154 lb 8 oz (70.081 kg). His oral temperature is 97.6 F (36.4 C). His blood pressure is 144/57 and his pulse is 69. His respiration is 18 and oxygen saturation is 100%.  He is a pleasant male in no distress sitting comfortably examining table. He is in no distress.  IMPRESSION: Jaran is a 66 y.o. male status post radiation to a right upper lobe lung cancer. He has no clinical evidence of disease  PLAN:  I would like to  schedule a scan for this week. I will then see him back in a year with a scan if everything looks okay. It is highly unlikely for lung cancer come back after 3 years. I will contact Dr. wall and Dr. Valentino Hue regarding the possibility of pulling these other teeth and fitting him for dentures.     Lurline Hare, MD

## 2012-07-23 NOTE — Progress Notes (Signed)
Patient presents to the clinic today accompanied by his wife for a follow up appointment with dr. Michell Heinrich. Patient alert and oriented to person, place, and time. No distress noted. Steady gait noted. Pleasant affect noted. Patient denies pain at this time. Patient to see [place] February 2013 but,ended up being hospitalized with heart condition. Patient reports that as a result of chemotherapy is only has 3 teeth on top and 1 tooth on the both. 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 Patient reports a mild occasional dry cough but, relates this to seasonal allergies and takes claritin. Patient denies shortness of breath. Patient reports good appetite and stable weight. Patient reports that he has occasional spells where his face gets hot and flush. Also, during this spell he has mild shortness of breath causing him to stop for a moment until he passes. Patient reports that he has been assured this is not related to his heart. Patient denies nausea, vomiting, headaches or dizziness. Reported all findings to Dr. Michell Heinrich.

## 2012-07-26 ENCOUNTER — Telehealth: Payer: Self-pay | Admitting: *Deleted

## 2012-07-26 NOTE — Telephone Encounter (Signed)
Called patient to inform of tests, line busy will call later

## 2012-07-26 NOTE — Telephone Encounter (Signed)
Called patient to inform of tests and fu visit , lvm for a return call.

## 2012-07-28 ENCOUNTER — Telehealth: Payer: Self-pay | Admitting: *Deleted

## 2012-07-28 ENCOUNTER — Ambulatory Visit (HOSPITAL_COMMUNITY)
Admission: RE | Admit: 2012-07-28 | Discharge: 2012-07-28 | Disposition: A | Discharge status: Home / Self Care | Payer: Medicare Other | Source: Ambulatory Visit | Attending: Radiation Oncology | Admitting: Radiation Oncology

## 2012-07-28 DIAGNOSIS — C3410 Malignant neoplasm of upper lobe, unspecified bronchus or lung: Secondary | ICD-10-CM

## 2012-07-28 DIAGNOSIS — C3490 Malignant neoplasm of unspecified part of unspecified bronchus or lung: Secondary | ICD-10-CM | POA: Insufficient documentation

## 2012-07-28 DIAGNOSIS — Z09 Encounter for follow-up examination after completed treatment for conditions other than malignant neoplasm: Secondary | ICD-10-CM | POA: Insufficient documentation

## 2012-07-28 NOTE — Telephone Encounter (Signed)
Called and spoke with patient results ct chest scan looks good per Dr.Wentworth, patient said "thankyou to Dr.Wentworth and staff, see you next year" 4:48 PM

## 2012-07-30 ENCOUNTER — Telehealth: Payer: Self-pay | Admitting: *Deleted

## 2012-07-30 NOTE — Telephone Encounter (Signed)
Called patient to ask question, lvm for a return call 

## 2012-07-30 NOTE — Progress Notes (Signed)
Sure will.   [name] 

## 2012-09-06 DIAGNOSIS — J209 Acute bronchitis, unspecified: Secondary | ICD-10-CM | POA: Diagnosis not present

## 2012-09-06 DIAGNOSIS — J441 Chronic obstructive pulmonary disease with (acute) exacerbation: Secondary | ICD-10-CM | POA: Diagnosis not present

## 2012-09-24 DIAGNOSIS — E785 Hyperlipidemia, unspecified: Secondary | ICD-10-CM | POA: Diagnosis not present

## 2012-09-24 DIAGNOSIS — I2510 Atherosclerotic heart disease of native coronary artery without angina pectoris: Secondary | ICD-10-CM | POA: Diagnosis not present

## 2012-09-24 DIAGNOSIS — M545 Low back pain: Secondary | ICD-10-CM | POA: Diagnosis not present

## 2012-11-02 ENCOUNTER — Other Ambulatory Visit: Payer: Self-pay | Admitting: *Deleted

## 2012-11-02 ENCOUNTER — Telehealth: Payer: Self-pay | Admitting: Cardiology

## 2012-11-02 DIAGNOSIS — I25810 Atherosclerosis of coronary artery bypass graft(s) without angina pectoris: Secondary | ICD-10-CM

## 2012-11-02 NOTE — Telephone Encounter (Signed)
New problem:   Wife calling patient need to be set up for doppler prior to April appt.

## 2012-11-02 NOTE — Telephone Encounter (Signed)
Will forward to [facility].  Order for Carotid Doppler placed in Epic.  Doppler is due in February per Dr. Daleen Squibb.  Please call pt to schedule.

## 2012-11-14 ENCOUNTER — Other Ambulatory Visit: Payer: Self-pay | Admitting: Cardiology

## 2012-12-01 ENCOUNTER — Encounter: Payer: Medicare Other | Admitting: Cardiology

## 2012-12-01 ENCOUNTER — Encounter (INDEPENDENT_AMBULATORY_CARE_PROVIDER_SITE_OTHER): Payer: Medicare Other

## 2012-12-01 DIAGNOSIS — I6529 Occlusion and stenosis of unspecified carotid artery: Secondary | ICD-10-CM

## 2012-12-14 DIAGNOSIS — I2510 Atherosclerotic heart disease of native coronary artery without angina pectoris: Secondary | ICD-10-CM | POA: Diagnosis not present

## 2012-12-14 DIAGNOSIS — R5381 Other malaise: Secondary | ICD-10-CM | POA: Diagnosis not present

## 2012-12-14 DIAGNOSIS — E785 Hyperlipidemia, unspecified: Secondary | ICD-10-CM | POA: Diagnosis not present

## 2012-12-14 DIAGNOSIS — E559 Vitamin D deficiency, unspecified: Secondary | ICD-10-CM | POA: Diagnosis not present

## 2012-12-14 DIAGNOSIS — I10 Essential (primary) hypertension: Secondary | ICD-10-CM | POA: Diagnosis not present

## 2012-12-14 DIAGNOSIS — R5383 Other fatigue: Secondary | ICD-10-CM | POA: Diagnosis not present

## 2012-12-14 DIAGNOSIS — C3490 Malignant neoplasm of unspecified part of unspecified bronchus or lung: Secondary | ICD-10-CM | POA: Diagnosis not present

## 2013-01-06 ENCOUNTER — Telehealth: Payer: Self-pay | Admitting: *Deleted

## 2013-01-06 ENCOUNTER — Other Ambulatory Visit: Payer: Self-pay | Admitting: Cardiology

## 2013-01-06 NOTE — Telephone Encounter (Signed)
I called and spoke with pt about upcoming appt with Dr. Daleen Squibb on 01/11/13.  Pt was seen in September 2013 & due for 1 yr follow-up this August.  He is not having any cardiac issues or problems and does not feel he needs to keep April appointment.  He is due in August for repeat carotid ultrasound.  Will place reminder for follow-up with Dr. Daleen Squibb in August Mylo Red RN

## 2013-01-11 ENCOUNTER — Ambulatory Visit: Payer: Medicare Other | Admitting: Cardiology

## 2013-01-12 ENCOUNTER — Other Ambulatory Visit: Payer: Self-pay | Admitting: *Deleted

## 2013-01-12 NOTE — Telephone Encounter (Signed)
Patient last seen on 12-14-12 for chronic health check. Rx last filled on 12-11-12. Please advise. If approved please have nurse call to [facility] in [place]. Thank you

## 2013-01-13 MED ORDER — ALPRAZOLAM 0.5 MG PO TABS: 1.0000 mg | ORAL_TABLET | Freq: Two times a day (BID) | ORAL | Status: DC | PRN

## 2013-01-13 NOTE — Telephone Encounter (Signed)
Prescription renewed in EPIC. Was to be a phone in

## 2013-01-17 ENCOUNTER — Other Ambulatory Visit: Payer: Self-pay | Admitting: *Deleted

## 2013-01-17 NOTE — Telephone Encounter (Signed)
LAST SEEN 3/14. PLEASE PRINT. CALL PATIENT WHEN READY. SENDING CHART BACK FOR REVIEW.

## 2013-01-18 ENCOUNTER — Other Ambulatory Visit: Payer: Self-pay | Admitting: Family Medicine

## 2013-01-18 MED ORDER — HYDROCODONE-ACETAMINOPHEN 10-325 MG PO TABS: 1.0000 | ORAL_TABLET | Freq: Four times a day (QID) | ORAL | Status: DC | PRN

## 2013-01-18 MED ORDER — ALPRAZOLAM 1 MG PO TABS: ORAL_TABLET | ORAL | Status: DC

## 2013-02-03 ENCOUNTER — Other Ambulatory Visit: Payer: Self-pay | Admitting: *Deleted

## 2013-02-03 MED ORDER — PRASUGREL HCL 10 MG PO TABS: 10.0000 mg | ORAL_TABLET | Freq: Every day | ORAL | Status: DC

## 2013-02-17 ENCOUNTER — Telehealth: Payer: Self-pay | Admitting: *Deleted

## 2013-02-17 ENCOUNTER — Other Ambulatory Visit: Payer: Self-pay | Admitting: Family Medicine

## 2013-02-17 MED ORDER — ALPRAZOLAM 1 MG PO TABS: ORAL_TABLET | ORAL | Status: DC

## 2013-02-17 MED ORDER — HYDROCODONE-ACETAMINOPHEN 10-325 MG PO TABS: 1.0000 | ORAL_TABLET | Freq: Four times a day (QID) | ORAL | Status: DC | PRN

## 2013-02-17 NOTE — Telephone Encounter (Signed)
rx for norco and xanax are up front - ready for pick up-pt aware.

## 2013-02-17 NOTE — Telephone Encounter (Signed)
Last seen 3/14  Last filled 01/18/13   Print and have nurse call patient to pick up

## 2013-02-18 NOTE — Telephone Encounter (Signed)
Done 02-17-13

## 2013-03-02 ENCOUNTER — Ambulatory Visit (INDEPENDENT_AMBULATORY_CARE_PROVIDER_SITE_OTHER): Payer: Medicare Other | Admitting: Cardiology

## 2013-03-02 ENCOUNTER — Encounter: Payer: Self-pay | Admitting: Cardiology

## 2013-03-02 VITALS — BP 122/64 | HR 74 | Ht 65.0 in | Wt 152.1 lb

## 2013-03-02 DIAGNOSIS — I6529 Occlusion and stenosis of unspecified carotid artery: Secondary | ICD-10-CM | POA: Diagnosis not present

## 2013-03-02 DIAGNOSIS — I10 Essential (primary) hypertension: Secondary | ICD-10-CM

## 2013-03-02 DIAGNOSIS — I658 Occlusion and stenosis of other precerebral arteries: Secondary | ICD-10-CM | POA: Diagnosis not present

## 2013-03-02 DIAGNOSIS — E785 Hyperlipidemia, unspecified: Secondary | ICD-10-CM

## 2013-03-02 DIAGNOSIS — I25810 Atherosclerosis of coronary artery bypass graft(s) without angina pectoris: Secondary | ICD-10-CM

## 2013-03-02 DIAGNOSIS — I6523 Occlusion and stenosis of bilateral carotid arteries: Secondary | ICD-10-CM

## 2013-03-02 NOTE — Assessment & Plan Note (Signed)
Progression in his right internal carotid artery. Repeat carotid Dopplers in August. I will see him at that time. Continue aggressive secondary preventive therapy.

## 2013-03-02 NOTE — Progress Notes (Signed)
HPI [name] returns today for evaluation and management of his history of coronary artery disease, history of coronary bypass grafting, history of severe carotid artery disease status post right carotid endarterectomy, hypertension and hyperlipidemia.  He's been having some sharp intermittent pain in the center of his chest. It usually goes away with I nerve pill. He denies any angina or ischemic symptoms. As of last fall, his lung cancer was stable by chest CT.  He had carotid Dopplers and February of this year which showed a totally occluded left internal carotid artery, progression in the right to about 79%, and patent vertebral arteries. He is on aggressive secondary preventative therapy. He denies any symptoms of TIAs or mini strokes and has no claudication.  Past Medical History  Diagnosis Date  . Lung cancer     lung ca dx 1/10  . Hypertension   . Hyperlipidemia   . CAD (coronary artery disease)     Dr. Daleen Squibb  - Cardiologist   . Dementia   . PVD (peripheral vascular disease)   . ASCVD (arteriosclerotic cardiovascular disease)   . Anxiety     Dr. Malena Catholic    . OA (osteoarthritis)   . Neuropathic pain     Current Outpatient Prescriptions  Medication Sig Dispense Refill  . ALPRAZolam (XANAX) 1 MG tablet TAKE ONE TABLET BY MOUTH TWICE DAILY AS NEEDED FOR NERVES  60 tablet  0  . amLODipine (NORVASC) 10 MG tablet TAKE 1 TABLET EVERY DAY  30 tablet  6  . aspirin 325 MG tablet Take 325 mg by mouth daily.        . fenofibrate 160 MG tablet Take 160 mg by mouth daily.        Marland Kitchen gabapentin (NEURONTIN) 300 MG capsule 1 TAB QD       . hydrochlorothiazide (MICROZIDE) 12.5 MG capsule TAKE 1 CAPSULE EVERY DAY  30 capsule  5  . hydrocodone-acetaminophen (LORCET-HD) 5-500 MG per capsule Take 1 capsule by mouth every 6 (six) hours as needed.        Marland Kitchen HYDROcodone-acetaminophen (NORCO) 10-325 MG per tablet Take 1 tablet by mouth every 6 (six) hours as needed for pain.  120 tablet  0  . KLOR-CON  M20 20 MEQ tablet TAKE 1 TABLET BY MOUTH DAILY  30 tablet  11  . metoprolol (LOPRESSOR) 50 MG tablet Take 1 tablet (50 mg total) by mouth 2 (two) times daily.  180 tablet  3  . Multiple Vitamin (MULTIVITAMIN) capsule Take 1 capsule by mouth daily.        . nitroGLYCERIN (NITROSTAT) 0.4 MG SL tablet Place 1 tablet (0.4 mg total) under the tongue every 5 (five) minutes as needed.  25 tablet  11  . prasugrel (EFFIENT) 10 MG TABS Take 1 tablet (10 mg total) by mouth daily. 1 tab po qd  30 tablet  1  . rosuvastatin (CRESTOR) 10 MG tablet Take 20 mg by mouth daily.        No current facility-administered medications for this visit.    Allergies  Allergen Reactions  . Demerol   . Clopidogrel Bisulfate   . Meperidine Hcl   . Penicillins   . Propoxyphene-Acetaminophen     Family History  Problem Relation Age of Onset  . Heart attack Mother   . Heart disease Father   . Heart disease Sister   . Heart disease Brother   . Coronary artery disease Other     History   Social History  .  Marital Status: Married    Spouse Name: N/A    Number of Children: N/A  . Years of Education: N/A   Occupational History  . Not on file.   Social History Main Topics  . Smoking status: Former Games developer  . Smokeless tobacco: Not on file  . Alcohol Use: No  . Drug Use:   . Sexually Active:    Other Topics Concern  . Not on file   Social History Narrative  . No narrative on file    ROS ALL NEGATIVE EXCEPT THOSE NOTED IN HPI  PE  General Appearance: well developed, well nourished in no acute distress HEENT: symmetrical face, PERRLA, good dentition  Neck: no JVD, thyromegaly, or adenopathy, trachea midline Chest: symmetric without deformity Cardiac: PMI non-displaced, RRR, normal S1, S2, no gallop or murmur Lung: clear to ausculation and percussion Vascular: all pulses full without bruits  Abdominal: nondistended, nontender, good bowel sounds, no HSM, no bruits Extremities: no cyanosis,  clubbing or edema, no sign of DVT, no varicosities  Skin: normal color, no rashes Neuro: alert and oriented x 3, non-focal Pysch: normal affect  EKG  BMET    Component Value Date/Time   NA 138 11/19/2010 0540   K 3.9 11/19/2010 0540   CL 106 11/19/2010 0540   CO2 27 11/19/2010 0540   GLUCOSE 102* 11/19/2010 0540   BUN 12.0 07/23/2012 1356   BUN 9 11/19/2010 0540   CREATININE 0.8 07/23/2012 1356   CREATININE 0.72 11/19/2010 0540   CALCIUM 8.9 11/19/2010 0540   GFRNONAA >60 11/19/2010 0540   GFRAA  Value: >60        The eGFR has been calculated using the MDRD equation. This calculation has not been validated in all clinical situations. eGFR's persistently <60 mL/min signify possible Chronic Kidney Disease. 11/19/2010 0540    Lipid Panel     Component Value Date/Time   CHOL  Value: 115        ATP III CLASSIFICATION:  <200     mg/dL   Desirable  161-096  mg/dL   Borderline High  >=045    mg/dL   High        01/12/8118 0400   TRIG 135 11/16/2010 0400   HDL 35* 11/16/2010 0400   CHOLHDL 3.3 11/16/2010 0400   VLDL 27 11/16/2010 0400   LDLCALC  Value: 53        Total Cholesterol/HDL:CHD Risk Coronary Heart Disease Risk Table                     Men   Women  1/2 Average Risk   3.4   3.3  Average Risk       5.0   4.4  2 X Average Risk   9.6   7.1  3 X Average Risk  23.4   11.0        Use the calculated Patient Ratio above and the CHD Risk Table to determine the patient's CHD Risk.        ATP III CLASSIFICATION (LDL):  <100     mg/dL   Optimal  147-829  mg/dL   Near or Above                    Optimal  130-159  mg/dL   Borderline  562-130  mg/dL   High  >865     mg/dL   Very High 7/84/6962 9528    CBC    Component Value Date/Time  WBC 5.8 12/04/2010 1050   WBC 7.5 01/01/2009 1126   RBC 4.49 12/04/2010 1050   RBC 4.14* 01/01/2009 1126   HGB 13.7 12/04/2010 1050   HGB 11.8* 01/01/2009 1126   HCT 39.8 12/04/2010 1050   HCT 34.2* 01/01/2009 1126   PLT 125.0* 12/04/2010 1050   PLT 183 01/01/2009 1126   MCV  88.5 12/04/2010 1050   MCV 82.7 01/01/2009 1126   MCH 29.4 11/19/2010 0540   MCH 28.5 01/01/2009 1126   MCHC 34.3 12/04/2010 1050   MCHC 34.5 01/01/2009 1126   RDW 13.9 12/04/2010 1050   RDW 18.0* 01/01/2009 1126   LYMPHSABS 0.8 12/04/2010 1050   LYMPHSABS 0.7* 01/01/2009 1126   MONOABS 0.5 12/04/2010 1050   MONOABS 0.5 01/01/2009 1126   EOSABS 0.1 12/04/2010 1050   EOSABS 0.0 01/01/2009 1126   BASOSABS 0.0 12/04/2010 1050   BASOSABS 0.0 01/01/2009 1126

## 2013-03-02 NOTE — Patient Instructions (Signed)
Your physician has requested that you have a carotid duplex in August same day as appointment with Dr. Daleen Squibb. This test is an ultrasound of the carotid arteries in your neck. It looks at blood flow through these arteries that supply the brain with blood. Allow one hour for this exam. There are no restrictions or special instructions.  Your physician recommends that you schedule a follow-up appointment in: August with Dr. Daleen Squibb.  Your physician recommends that you continue on your current medications as directed. Please refer to the Current Medication list given to you today.

## 2013-03-02 NOTE — Assessment & Plan Note (Signed)
Stable. Continue secondary preventative therapy. I have reinforced that he has sharp chest pain is noncardiac.

## 2013-03-11 ENCOUNTER — Other Ambulatory Visit: Payer: Self-pay | Admitting: Family Medicine

## 2013-03-17 ENCOUNTER — Encounter: Payer: Self-pay | Admitting: Family Medicine

## 2013-03-17 ENCOUNTER — Ambulatory Visit (INDEPENDENT_AMBULATORY_CARE_PROVIDER_SITE_OTHER): Payer: Medicare Other | Admitting: Family Medicine

## 2013-03-17 VITALS — BP 127/65 | HR 55 | Temp 97.6°F | Ht 65.0 in | Wt 150.4 lb

## 2013-03-17 DIAGNOSIS — I6529 Occlusion and stenosis of unspecified carotid artery: Secondary | ICD-10-CM | POA: Diagnosis not present

## 2013-03-17 DIAGNOSIS — E785 Hyperlipidemia, unspecified: Secondary | ICD-10-CM | POA: Diagnosis not present

## 2013-03-17 DIAGNOSIS — G894 Chronic pain syndrome: Secondary | ICD-10-CM

## 2013-03-17 DIAGNOSIS — I25810 Atherosclerosis of coronary artery bypass graft(s) without angina pectoris: Secondary | ICD-10-CM | POA: Diagnosis not present

## 2013-03-17 DIAGNOSIS — I10 Essential (primary) hypertension: Secondary | ICD-10-CM

## 2013-03-17 DIAGNOSIS — F411 Generalized anxiety disorder: Secondary | ICD-10-CM

## 2013-03-17 DIAGNOSIS — R0989 Other specified symptoms and signs involving the circulatory and respiratory systems: Secondary | ICD-10-CM

## 2013-03-17 DIAGNOSIS — D696 Thrombocytopenia, unspecified: Secondary | ICD-10-CM

## 2013-03-17 DIAGNOSIS — F419 Anxiety disorder, unspecified: Secondary | ICD-10-CM | POA: Insufficient documentation

## 2013-03-17 DIAGNOSIS — C3410 Malignant neoplasm of upper lobe, unspecified bronchus or lung: Secondary | ICD-10-CM

## 2013-03-17 LAB — CBC WITH DIFFERENTIAL/PLATELET
Basophils Absolute: 0 10*3/uL (ref 0.0–0.1)
Basophils Relative: 1 % (ref 0–1)
Eosinophils Absolute: 0.2 10*3/uL (ref 0.0–0.7)
Eosinophils Relative: 4 % (ref 0–5)
HCT: 39.6 % (ref 39.0–52.0)
Hemoglobin: 13.3 g/dL (ref 13.0–17.0)
Lymphocytes Relative: 16 % (ref 12–46)
Lymphs Abs: 0.8 10*3/uL (ref 0.7–4.0)
MCH: 28.6 pg (ref 26.0–34.0)
MCHC: 33.6 g/dL (ref 30.0–36.0)
MCV: 85.2 fL (ref 78.0–100.0)
Monocytes Absolute: 0.5 10*3/uL (ref 0.1–1.0)
Monocytes Relative: 10 % (ref 3–12)
Neutro Abs: 3.7 10*3/uL (ref 1.7–7.7)
Neutrophils Relative %: 69 % (ref 43–77)
Platelets: 137 10*3/uL — ABNORMAL LOW (ref 150–400)
RBC: 4.65 MIL/uL (ref 4.22–5.81)
RDW: 13.6 % (ref 11.5–15.5)
WBC: 5.2 10*3/uL (ref 4.0–10.5)

## 2013-03-17 LAB — COMPLETE METABOLIC PANEL WITH GFR
ALT: 16 U/L (ref 0–53)
AST: 17 U/L (ref 0–37)
Albumin: 4.7 g/dL (ref 3.5–5.2)
Alkaline Phosphatase: 48 U/L (ref 39–117)
BUN: 9 mg/dL (ref 6–23)
CO2: 28 mEq/L (ref 19–32)
Calcium: 9.3 mg/dL (ref 8.4–10.5)
Chloride: 105 mEq/L (ref 96–112)
Creat: 0.97 mg/dL (ref 0.50–1.35)
GFR, Est African American: >89 mL/min
GFR, Est Non African American: 80 mL/min
Glucose, Bld: 94 mg/dL (ref 70–99)
Potassium: 4.1 mEq/L (ref 3.5–5.3)
Sodium: 141 mEq/L (ref 135–145)
Total Bilirubin: 0.4 mg/dL (ref 0.3–1.2)
Total Protein: 6.8 g/dL (ref 6.0–8.3)

## 2013-03-17 MED ORDER — FENOFIBRATE 160 MG PO TABS: ORAL_TABLET | ORAL | Status: DC

## 2013-03-17 MED ORDER — HYDROCODONE-ACETAMINOPHEN 10-325 MG PO TABS: 1.0000 | ORAL_TABLET | Freq: Four times a day (QID) | ORAL | Status: DC | PRN

## 2013-03-17 MED ORDER — ALPRAZOLAM 1 MG PO TABS: ORAL_TABLET | ORAL | Status: DC

## 2013-03-17 NOTE — Patient Instructions (Addendum)
      Dr Taneka Espiritu's Recommendations  Diet and Exercise discussed with patient.  For nutrition information, I recommend books:  1).Eat to Live by Dr Joel Fuhrman. 2).Prevent and Reverse Heart Disease by Dr Caldwell Esselstyn. 3) Dr Neal Barnard's Book:  Program to Reverse Diabetes  Exercise recommendations are:  If unable to walk, then the patient can exercise in a chair 3 times a day. By flapping arms like a bird gently and raising legs outwards to the front.  If ambulatory, the patient can go for walks for 30 minutes 3 times a week. Then increase the intensity and duration as tolerated.  Goal is to try to attain exercise frequency to 5 times a week.  If applicable: Best to perform resistance exercises (machines or weights) 2 days a week and cardio type exercises 3 days per week.  

## 2013-03-17 NOTE — Progress Notes (Signed)
Patient ID: [name], male   DOB: 11/28/1945, 67 y.o.   MRN: [id number] SUBJECTIVE: CC: Chief Complaint  Patient presents with  . Follow-up    3 month fasting  . Medication Refill    hydrocodone and alprazolam fenfofibrate    HPI: Patient is here for follow up of hyperlipidemia/ carotid  Disease/ CAD:  denies Headache;has had Chest Pain evaluated by Cardiology Dr Daleen Squibb. ;denies weakness;denies Shortness of Breath and orthopnea;denies Visual changes;denies palpitations;denies cough;denies pedal edema;denies symptoms of TIA or stroke;deniesClaudication symptoms. admits to Compliance with medications; denies Problems with medications.  Breakfast:Toast, maybe a boiled egg, oatmeal 1 minute, Lunch: [place] sandwich, tomatoes,baked potato, grilled chicken Dinner: ooddles of noodles.  He will soon be scheduled for Carotid endarectomy  PMH/PSH: reviewed/updated in Epic  SH/FH: reviewed/updated in Epic  Allergies: reviewed/updated in Epic  Medications: reviewed/updated in Epic  Immunizations: reviewed/updated in Epic  ROS: As above in the HPI. All other systems are stable or negative.  OBJECTIVE: APPEARANCE:  Patient in no acute distress.The patient appeared well nourished and normally developed. Acyanotic. Waist: VITAL SIGNS:BP 127/65  Pulse 55  Temp(Src) 97.6 F (36.4 C) (Oral)  Ht 5\' 5"  (1.651 m)  Wt 150 lb 6.4 oz (68.221 kg)  BMI 25.03 kg/m2 WM  SKIN: warm and  Dry without overt rashes, tattoos and scars  HEAD and Neck: without JVD, Head and scalp: normal Eyes:No scleral icterus. Fundi normal, eye movements normal. Ears: Auricle normal, canal normal, Tympanic membranes normal, insufflation normal. Nose: normal Throat: normal Neck & thyroid: scar right carotid artery areaCarotid  Disease bilateral. Left bruit.and right bruit  CHEST & LUNGS: Chest wall: normal Lungs: Clear  CVS: Reveals the PMI to be normally located. Regular rhythm, First and Second  Heart sounds are normal,  absence of rubs or gallops. Soft 2/6 SEM at left sternal border. Peripheral vasculature: Radial pulses: normal  ABDOMEN:  Appearance: normal Benign, no organomegaly, no masses, no Abdominal Aortic enlargement. No Guarding , no rebound. No Bruits. Bowel sounds: normal  RECTAL: N/A GU: N/A  EXTREMETIES: nonedematous. Both  Pedal pulses are reduced.  MUSCULOSKELETAL:  Spine:chronic back pain and hip and knee pains.  NEUROLOGIC: oriented to time,place and person; nonfocal.  ASSESSMENT: CAD, ARTERY BYPASS GRAFT - Plan: COMPLETE METABOLIC PANEL WITH GFR, CBC with Differential  Occlusion and stenosis of carotid artery without mention of cerebral infarction, unspecified laterality - Plan: COMPLETE METABOLIC PANEL WITH GFR  HYPERTENSION - Plan: COMPLETE METABOLIC PANEL WITH GFR  THROMBOCYTOPENIA - Plan: CANCELED: POCT CBC  HYPERLIPIDEMIA - Plan: COMPLETE METABOLIC PANEL WITH GFR, NMR Lipoprofile with Lipids, fenofibrate 160 MG tablet  Malignant neoplasm of upper lobe, bronchus or lung, unspecified laterality  FEMORAL BRUIT  Anxiety state, unspecified - Plan: ALPRAZolam (XANAX) 1 MG tablet  Chronic pain syndrome - Plan: HYDROcodone-acetaminophen (NORCO) 10-325 MG per tablet   PLAN: Orders Placed This Encounter  Procedures  . COMPLETE METABOLIC PANEL WITH GFR  . NMR Lipoprofile with Lipids  . CBC with Differential   Meds ordered this encounter  Medications  . fenofibrate 160 MG tablet    Sig: TAKE 1 TABLET EVERY DAY    Dispense:  30 tablet    Refill:  11  . HYDROcodone-acetaminophen (NORCO) 10-325 MG per tablet    Sig: Take 1 tablet by mouth every 6 (six) hours as needed for pain.    Dispense:  120 tablet    Refill:  0  . ALPRAZolam (XANAX) 1 MG tablet    Sig: TAKE ONE  TABLET BY MOUTH TWICE DAILY AS NEEDED FOR NERVES    Dispense:  90 tablet    Refill:  1   Results for orders placed in visit on 03/17/13  CBC WITH DIFFERENTIAL      Result  Value Range   WBC 5.2  4.0 - 10.5 K/uL   RBC 4.65  4.22 - 5.81 MIL/uL   Hemoglobin 13.3  13.0 - 17.0 g/dL   HCT 40.9  81.1 - 91.4 %   MCV 85.2  78.0 - 100.0 fL   MCH 28.6  26.0 - 34.0 pg   MCHC 33.6  30.0 - 36.0 g/dL   RDW 78.2  95.6 - 21.3 %   Platelets 137 (*) 150 - 400 K/uL   Neutrophils Relative % 69  43 - 77 %   Neutro Abs 3.7  1.7 - 7.7 K/uL   Lymphocytes Relative 16  12 - 46 %   Lymphs Abs 0.8  0.7 - 4.0 K/uL   Monocytes Relative 10  3 - 12 %   Monocytes Absolute 0.5  0.1 - 1.0 K/uL   Eosinophils Relative 4  0 - 5 %   Eosinophils Absolute 0.2  0.0 - 0.7 K/uL   Basophils Relative 1  0 - 1 %   Basophils Absolute 0.0  0.0 - 0.1 K/uL   Smear Review Criteria for review not met          Dr Woodroe Mode Recommendations  Diet and Exercise discussed with patient.  For nutrition information, I recommend books:  1).Eat to Live by Dr Monico Hoar. 2).Prevent and Reverse Heart Disease by Dr Suzzette Righter. 3) Dr Katherina Right Book:  Program to Reverse Diabetes  Exercise recommendations are:  If unable to walk, then the patient can exercise in a chair 3 times a day. By flapping arms like a bird gently and raising legs outwards to the front.  If ambulatory, the patient can go for walks for 30 minutes 3 times a week. Then increase the intensity and duration as tolerated.  Goal is to try to attain exercise frequency to 5 times a week.  If applicable: Best to perform resistance exercises (machines or weights) 2 days a week and cardio type exercises 3 days per week.   Discussed with patient about studies with reversal of cardiovascular diseases with a plant based diet. He has been doing the traditional AHA diet and he continues to progress.  45 minutes spent in discussion that at this point he should consider the aggressive dietary intervention because of his poor cardiovascular status. 45 minutes spent discussing with patient the option of aggressive plant based diet and the  research studies and the effect that it has had on [employer] as an example of  Someone who has gone the path of vegan in the face of serious cardiovascular disease.  Return in about 3 months (around 06/17/2013) for Recheck medical problems.  Avaley Coop P. [place] Charon, M.D.

## 2013-03-18 LAB — NMR LIPOPROFILE WITH LIPIDS
Cholesterol, Total: 127 mg/dL (ref <200)
HDL Particle Number: 34.8 umol/L (ref >=30.5)
HDL Size: 8.4 nm — ABNORMAL LOW (ref >=9.2)
HDL-C: 34 mg/dL — ABNORMAL LOW (ref >=40)
LDL (calc): 63 mg/dL (ref <100)
LDL Particle Number: 1276 nmol/L — ABNORMAL HIGH (ref <1000)
LDL Size: 20 nm — ABNORMAL LOW (ref >20.5)
LP-IR Score: 71 — ABNORMAL HIGH (ref <=45)
Large HDL-P: 1.3 umol/L — ABNORMAL LOW (ref >=4.8)
Large VLDL-P: 3.2 nmol/L — ABNORMAL HIGH (ref <=2.7)
Small LDL Particle Number: 891 nmol/L — ABNORMAL HIGH (ref <=527)
Triglycerides: 148 mg/dL (ref <150)
VLDL Size: 47.9 nm — ABNORMAL HIGH (ref <=46.6)

## 2013-03-18 NOTE — Progress Notes (Signed)
Quick Note:  Lab result at goal. No change in Medications for now. No Change in plans and follow up. ______ 

## 2013-03-24 ENCOUNTER — Telehealth: Payer: Self-pay | Admitting: Family Medicine

## 2013-03-25 ENCOUNTER — Other Ambulatory Visit: Payer: Self-pay | Admitting: Family Medicine

## 2013-03-25 DIAGNOSIS — F411 Generalized anxiety disorder: Secondary | ICD-10-CM

## 2013-03-25 MED ORDER — ALPRAZOLAM 1 MG PO TABS: ORAL_TABLET | ORAL | Status: DC

## 2013-03-25 NOTE — Telephone Encounter (Signed)
Rx  Written again.

## 2013-03-25 NOTE — Telephone Encounter (Signed)
Family aware to pick up rx at front desk at office.

## 2013-03-27 ENCOUNTER — Other Ambulatory Visit: Payer: Self-pay | Admitting: Cardiology

## 2013-03-30 ENCOUNTER — Other Ambulatory Visit: Payer: Self-pay | Admitting: *Deleted

## 2013-03-30 MED ORDER — ROSUVASTATIN CALCIUM 20 MG PO TABS: 20.0000 mg | ORAL_TABLET | Freq: Every day | ORAL | Status: DC

## 2013-03-31 ENCOUNTER — Other Ambulatory Visit: Payer: Self-pay

## 2013-03-31 MED ORDER — ROSUVASTATIN CALCIUM 20 MG PO TABS: 20.0000 mg | ORAL_TABLET | Freq: Every day | ORAL | Status: DC

## 2013-04-03 ENCOUNTER — Other Ambulatory Visit: Payer: Self-pay | Admitting: Family Medicine

## 2013-04-04 ENCOUNTER — Other Ambulatory Visit: Payer: Self-pay

## 2013-04-14 ENCOUNTER — Other Ambulatory Visit: Payer: Self-pay | Admitting: Family Medicine

## 2013-04-15 NOTE — Telephone Encounter (Signed)
Last seen and filled 03/17/13. IF approved route and have [name] H call pt to pickup

## 2013-04-27 ENCOUNTER — Other Ambulatory Visit: Payer: Self-pay | Admitting: Family Medicine

## 2013-05-03 ENCOUNTER — Other Ambulatory Visit: Payer: Self-pay | Admitting: Family Medicine

## 2013-05-16 ENCOUNTER — Other Ambulatory Visit: Payer: Self-pay | Admitting: Family Medicine

## 2013-05-17 NOTE — Telephone Encounter (Signed)
Last seen 06/12 last filled 04/14/13. Rx will print for pt to pickup if approved, have nurse call pt [place] Charon)

## 2013-05-18 NOTE — Telephone Encounter (Signed)
Number busy but Rx is up front ready to be picked up

## 2013-05-18 NOTE — Telephone Encounter (Signed)
Please address this for me, patient is very upset, he has been out of pain meds for 2 days, this was sent to [facility] yesterday, and now he is off today. If approved pls, have nurse call them asap

## 2013-05-18 NOTE — Telephone Encounter (Signed)
rx ready for pickup 

## 2013-05-18 NOTE — Telephone Encounter (Signed)
Patient aware.

## 2013-05-25 ENCOUNTER — Encounter: Payer: Self-pay | Admitting: Cardiology

## 2013-05-25 ENCOUNTER — Encounter (INDEPENDENT_AMBULATORY_CARE_PROVIDER_SITE_OTHER): Payer: Medicare Other

## 2013-05-25 ENCOUNTER — Ambulatory Visit (INDEPENDENT_AMBULATORY_CARE_PROVIDER_SITE_OTHER): Payer: Medicare Other | Admitting: Cardiology

## 2013-05-25 VITALS — BP 167/53 | HR 64 | Ht 65.0 in | Wt 152.0 lb

## 2013-05-25 DIAGNOSIS — I10 Essential (primary) hypertension: Secondary | ICD-10-CM | POA: Diagnosis not present

## 2013-05-25 DIAGNOSIS — F411 Generalized anxiety disorder: Secondary | ICD-10-CM

## 2013-05-25 DIAGNOSIS — I6523 Occlusion and stenosis of bilateral carotid arteries: Secondary | ICD-10-CM

## 2013-05-25 DIAGNOSIS — I6529 Occlusion and stenosis of unspecified carotid artery: Secondary | ICD-10-CM | POA: Diagnosis not present

## 2013-05-25 DIAGNOSIS — I25810 Atherosclerosis of coronary artery bypass graft(s) without angina pectoris: Secondary | ICD-10-CM

## 2013-05-25 DIAGNOSIS — I219 Acute myocardial infarction, unspecified: Secondary | ICD-10-CM

## 2013-05-25 DIAGNOSIS — E785 Hyperlipidemia, unspecified: Secondary | ICD-10-CM

## 2013-05-25 NOTE — Patient Instructions (Addendum)
Your physician wants you to follow-up in: with dr cooper in 6 months  You will receive a reminder letter in the mail two months in advance. If you don't receive a letter, please call our office to schedule the follow-up appointment.  Your physician recommends that you continue on your current medications as directed. Please refer to the Current Medication list given to you today.

## 2013-05-25 NOTE — Progress Notes (Signed)
HPI [name] returns today for evaluation and management his coronary artery disease, peripheral vascular disease with carotid disease.  His cancer has not recurred. He's in pretty good spirits today. He denies any angina but continues to have atypical chest discomfort.  He is very compliant with his medications. Carotids were done today. Preliminary report shows stability.  Past Medical History  Diagnosis Date  . Lung cancer     lung ca dx 1/10  . Hypertension   . Hyperlipidemia   . CAD (coronary artery disease)     Dr. Daleen Squibb  - Cardiologist   . Dementia   . PVD (peripheral vascular disease)   . ASCVD (arteriosclerotic cardiovascular disease)   . Anxiety     Dr. Malena Catholic    . OA (osteoarthritis)   . Neuropathic pain     Current Outpatient Prescriptions  Medication Sig Dispense Refill  . ALPRAZolam (XANAX) 1 MG tablet TAKE ONE TABLET BY MOUTH THREE times DAILY AS NEEDED FOR NERVES  90 tablet  1  . amLODipine (NORVASC) 10 MG tablet TAKE 1 TABLET EVERY DAY  30 tablet  6  . aspirin 325 MG tablet Take 325 mg by mouth daily.        Marland Kitchen EFFIENT 10 MG TABS TAKE 1 TABLET EVERY DAY  30 tablet  2  . fenofibrate 160 MG tablet TAKE 1 TABLET EVERY DAY  30 tablet  11  . gabapentin (NEURONTIN) 300 MG capsule TAKE 1 CAPSULE BY MOUTH EVERY EIGHT HOURS  90 capsule  2  . hydrochlorothiazide (MICROZIDE) 12.5 MG capsule TAKE 1 CAPSULE EVERY DAY  30 capsule  5  . HYDROcodone-acetaminophen (NORCO) 10-325 MG per tablet TAKE 1 TABLET EVERY 6 HOURS AS NEEDED FOR PAIN  120 tablet  0  . KLOR-CON M20 20 MEQ tablet TAKE 1 TABLET BY MOUTH DAILY  30 tablet  11  . metoprolol (LOPRESSOR) 50 MG tablet Take 1 tablet (50 mg total) by mouth 2 (two) times daily.  180 tablet  3  . Multiple Vitamin (MULTIVITAMIN) capsule Take 1 capsule by mouth daily.        . nitroGLYCERIN (NITROSTAT) 0.4 MG SL tablet Place 0.4 mg under the tongue every 5 (five) minutes as needed. After 3 doses and no relief call 911      . rosuvastatin  (CRESTOR) 20 MG tablet Take 1 tablet (20 mg total) by mouth daily.  30 tablet  5   No current facility-administered medications for this visit.    Allergies  Allergen Reactions  . Demerol Nausea And Vomiting  . Clopidogrel Bisulfate     Severe rash  . Meperidine Hcl   . Penicillins     Rash   . Propoxyphene-Acetaminophen     nauesa    Family History  Problem Relation Age of Onset  . Heart attack Mother   . Heart disease Father   . Heart disease Sister   . Heart disease Brother   . Coronary artery disease Other     History   Social History  . Marital Status: Married    Spouse Name: N/A    Number of Children: N/A  . Years of Education: N/A   Occupational History  . Not on file.   Social History Main Topics  . Smoking status: Former Games developer  . Smokeless tobacco: Not on file  . Alcohol Use: No  . Drug Use:   . Sexual Activity:    Other Topics Concern  . Not on file   Social  History Narrative  . No narrative on file    ROS ALL NEGATIVE EXCEPT THOSE NOTED IN HPI  PE  General Appearance: well developed, well nourished in no acute distress HEENT: symmetrical face, PERRLA, good dentition  Neck: no JVD, thyromegaly, or adenopathy, trachea midline Chest: symmetric without deformity Cardiac: PMI non-displaced, RRR, normal S1, S2, no gallop or murmur Lung: clear to ausculation and percussion Vascular: Pulses diminished but present in the lower extremity, bilateral carotid bruits Abdominal: nondistended, nontender, good bowel sounds, no HSM, no bruits Extremities: no cyanosis, clubbing or edema, no sign of DVT, no varicosities  Skin: normal color, no rashes Neuro: alert and oriented x 3, non-focal Pysch: normal affect  EKG Normal sinus rhythm, nonspecific ST segment changes, stable BMET    Component Value Date/Time   NA 141 03/17/2013 0928   K 4.1 03/17/2013 0928   CL 105 03/17/2013 0928   CO2 28 03/17/2013 0928   GLUCOSE 94 03/17/2013 0928   BUN 9 03/17/2013  0928   BUN 12.0 07/23/2012 1356   CREATININE 0.97 03/17/2013 0928   CREATININE 0.8 07/23/2012 1356   CREATININE 0.72 11/19/2010 0540   CALCIUM 9.3 03/17/2013 0928   GFRNONAA >60 11/19/2010 0540   GFRAA  Value: >60        The eGFR has been calculated using the MDRD equation. This calculation has not been validated in all clinical situations. eGFR's persistently <60 mL/min signify possible Chronic Kidney Disease. 11/19/2010 0540    Lipid Panel     Component Value Date/Time   CHOL  Value: 115        ATP III CLASSIFICATION:  <200     mg/dL   Desirable  161-096  mg/dL   Borderline High  >=045    mg/dL   High        01/12/8118 0400   TRIG 148 03/17/2013 0928   TRIG 135 11/16/2010 0400   HDL 35* 11/16/2010 0400   CHOLHDL 3.3 11/16/2010 0400   VLDL 27 11/16/2010 0400   LDLCALC 63 03/17/2013 0928   LDLCALC  Value: 53        Total Cholesterol/HDL:CHD Risk Coronary Heart Disease Risk Table                     Men   Women  1/2 Average Risk   3.4   3.3  Average Risk       5.0   4.4  2 X Average Risk   9.6   7.1  3 X Average Risk  23.4   11.0        Use the calculated Patient Ratio above and the CHD Risk Table to determine the patient's CHD Risk.        ATP III CLASSIFICATION (LDL):  <100     mg/dL   Optimal  147-829  mg/dL   Near or Above                    Optimal  130-159  mg/dL   Borderline  562-130  mg/dL   High  >865     mg/dL   Very High 7/84/6962 9528    CBC    Component Value Date/Time   WBC 5.2 03/17/2013 0956   WBC 7.5 01/01/2009 1126   RBC 4.65 03/17/2013 0956   RBC 4.14* 01/01/2009 1126   HGB 13.3 03/17/2013 0956   HGB 11.8* 01/01/2009 1126   HCT 39.6 03/17/2013 0956   HCT 34.2* 01/01/2009 1126  PLT 137* 03/17/2013 0956   PLT 183 01/01/2009 1126   MCV 85.2 03/17/2013 0956   MCV 82.7 01/01/2009 1126   MCH 28.6 03/17/2013 0956   MCH 28.5 01/01/2009 1126   MCHC 33.6 03/17/2013 0956   MCHC 34.5 01/01/2009 1126   RDW 13.6 03/17/2013 0956   RDW 18.0* 01/01/2009 1126   LYMPHSABS 0.8 03/17/2013 0956    LYMPHSABS 0.7* 01/01/2009 1126   MONOABS 0.5 03/17/2013 0956   MONOABS 0.5 01/01/2009 1126   EOSABS 0.2 03/17/2013 0956   EOSABS 0.0 01/01/2009 1126   BASOSABS 0.0 03/17/2013 0956   BASOSABS 0.0 01/01/2009 1126

## 2013-05-25 NOTE — Assessment & Plan Note (Signed)
Preliminary report is stable. Continue secondary preventative therapy.

## 2013-05-25 NOTE — Assessment & Plan Note (Addendum)
Stable. Continue secondary preventative therapy. He will also followup with Dr. Excell Seltzer.

## 2013-06-14 ENCOUNTER — Other Ambulatory Visit: Payer: Self-pay | Admitting: Family Medicine

## 2013-06-14 ENCOUNTER — Other Ambulatory Visit: Payer: Self-pay | Admitting: Nurse Practitioner

## 2013-06-16 ENCOUNTER — Other Ambulatory Visit: Payer: Self-pay | Admitting: Family Medicine

## 2013-06-16 NOTE — Telephone Encounter (Signed)
Pt aware that rx up front desk for hydrocodone 10/325

## 2013-06-16 NOTE — Telephone Encounter (Signed)
Rx ready for pick up. 

## 2013-06-16 NOTE — Telephone Encounter (Signed)
LAST RF 05/18/13. LAST OV 03/17/13. PRINT AND CALL PT TO PICKUP IF APPROVED. SEE ALLERGIES THANKS.

## 2013-06-16 NOTE — Telephone Encounter (Signed)
Rx ready for Phone in. 

## 2013-06-16 NOTE — Telephone Encounter (Signed)
[facility]

## 2013-06-16 NOTE — Telephone Encounter (Signed)
LAST RF 05/16/13. LAST OV 03/17/13. CALL IN [facility] IS APPROVED

## 2013-06-21 ENCOUNTER — Encounter: Payer: Self-pay | Admitting: Family Medicine

## 2013-06-21 ENCOUNTER — Ambulatory Visit (INDEPENDENT_AMBULATORY_CARE_PROVIDER_SITE_OTHER): Payer: Medicare Other | Admitting: Family Medicine

## 2013-06-21 VITALS — BP 125/56 | HR 54 | Temp 97.1°F | Ht 65.0 in | Wt 152.0 lb

## 2013-06-21 DIAGNOSIS — E785 Hyperlipidemia, unspecified: Secondary | ICD-10-CM | POA: Diagnosis not present

## 2013-06-21 DIAGNOSIS — C3410 Malignant neoplasm of upper lobe, unspecified bronchus or lung: Secondary | ICD-10-CM

## 2013-06-21 DIAGNOSIS — I6529 Occlusion and stenosis of unspecified carotid artery: Secondary | ICD-10-CM | POA: Diagnosis not present

## 2013-06-21 DIAGNOSIS — I10 Essential (primary) hypertension: Secondary | ICD-10-CM

## 2013-06-21 DIAGNOSIS — G894 Chronic pain syndrome: Secondary | ICD-10-CM

## 2013-06-21 DIAGNOSIS — I25810 Atherosclerosis of coronary artery bypass graft(s) without angina pectoris: Secondary | ICD-10-CM | POA: Diagnosis not present

## 2013-06-21 DIAGNOSIS — J309 Allergic rhinitis, unspecified: Secondary | ICD-10-CM

## 2013-06-21 NOTE — Progress Notes (Signed)
Patient ID: [name], male   DOB: 12-19-1945, 67 y.o.   MRN: [id number] SUBJECTIVE: CC: Chief Complaint  Patient presents with  . Follow-up    3 month follow up fastng     HPI: Breakfast:toast and [place], Lunch: dry beans, baked potato, boiled fish Supper: seldom  Patient is here for follow up of hyperlipidemia/HTN/CAD/PVD: denies Headache;denies Chest Pain;denies weakness;denies Shortness of Breath and orthopnea;denies Visual changes;denies palpitations;denies cough;denies pedal edema;denies symptoms of TIA or stroke;deniesClaudication symptoms. admits to Compliance with medications; denies Problems with medications.  Past Medical History  Diagnosis Date  . Lung cancer     lung ca dx 1/10  . Hypertension   . Hyperlipidemia   . CAD (coronary artery disease)     Dr. Daleen Squibb  - Cardiologist   . Dementia   . PVD (peripheral vascular disease)   . ASCVD (arteriosclerotic cardiovascular disease)   . Anxiety     Dr. Malena Catholic    . OA (osteoarthritis)   . Neuropathic pain    Past Surgical History  Procedure Laterality Date  . Coronary artery bypass graft  7/95    x5 ([facility] )  . Carotid endarterectomy  12/98    right ([facility])  . Cardiac catheterization  12/20/04    recurrent    History   Social History  . Marital Status: Married    Spouse Name: N/A    Number of Children: N/A  . Years of Education: N/A   Occupational History  . Not on file.   Social History Main Topics  . Smoking status: Former Games developer  . Smokeless tobacco: Not on file  . Alcohol Use: No  . Drug Use:   . Sexual Activity:    Other Topics Concern  . Not on file   Social History Narrative  . No narrative on file   Family History  Problem Relation Age of Onset  . Heart attack Mother   . Heart disease Father   . Heart disease Sister   . Heart disease Brother   . Coronary artery disease Other    Current Outpatient Prescriptions on File Prior to Visit  Medication Sig Dispense Refill  .  ALPRAZolam (XANAX) 1 MG tablet TAKE 1 TABLET BY MOUTH THREE TIMES DAILY  90 tablet  1  . amLODipine (NORVASC) 10 MG tablet TAKE 1 TABLET EVERY DAY  30 tablet  6  . aspirin 325 MG tablet Take 325 mg by mouth daily.        Marland Kitchen EFFIENT 10 MG TABS TAKE 1 TABLET EVERY DAY  30 tablet  2  . fenofibrate 160 MG tablet TAKE 1 TABLET EVERY DAY  30 tablet  11  . gabapentin (NEURONTIN) 300 MG capsule TAKE 1 CAPSULE BY MOUTH EVERY EIGHT HOURS  90 capsule  2  . hydrochlorothiazide (MICROZIDE) 12.5 MG capsule TAKE 1 CAPSULE EVERY DAY  30 capsule  5  . HYDROcodone-acetaminophen (NORCO) 10-325 MG per tablet TAKE 1 TABLET BY MOUTH EVERY SIX HOURS AS NEEDED FOR PAIN  120 tablet  0  . KLOR-CON M20 20 MEQ tablet TAKE 1 TABLET BY MOUTH DAILY  30 tablet  11  . metoprolol (LOPRESSOR) 50 MG tablet Take 1 tablet (50 mg total) by mouth 2 (two) times daily.  180 tablet  3  . Multiple Vitamin (MULTIVITAMIN) capsule Take 1 capsule by mouth daily.        . nitroGLYCERIN (NITROSTAT) 0.4 MG SL tablet Place 0.4 mg under the tongue every 5 (five) minutes as needed.  After 3 doses and no relief call 911      . rosuvastatin (CRESTOR) 20 MG tablet Take 1 tablet (20 mg total) by mouth daily.  30 tablet  5   No current facility-administered medications on file prior to visit.   Allergies  Allergen Reactions  . Demerol Nausea And Vomiting  . Clopidogrel Bisulfate     Severe rash  . Meperidine Hcl   . Penicillins     Rash   . Propoxyphene-Acetaminophen     nauesa   Immunization History  Administered Date(s) Administered  . Pneumococcal Polysaccharide 05/07/2011  . Tdap 08/19/2010  . Zoster 05/06/2012   Prior to Admission medications   Medication Sig Start Date End Date Taking? Authorizing Provider  ALPRAZolam Prudy Feeler) 1 MG tablet TAKE 1 TABLET BY MOUTH THREE TIMES DAILY 06/14/13   Ileana Ladd, MD  amLODipine (NORVASC) 10 MG tablet TAKE 1 TABLET EVERY DAY 01/06/13   Gaylord Shih, MD  aspirin 325 MG tablet Take 325 mg by mouth  daily.      Historical Provider, MD  EFFIENT 10 MG TABS TAKE 1 TABLET EVERY DAY 04/03/13   Ileana Ladd, MD  fenofibrate 160 MG tablet TAKE 1 TABLET EVERY DAY 03/17/13   Ileana Ladd, MD  gabapentin (NEURONTIN) 300 MG capsule TAKE 1 CAPSULE BY MOUTH EVERY EIGHT HOURS 05/03/13   Ileana Ladd, MD  hydrochlorothiazide (MICROZIDE) 12.5 MG capsule TAKE 1 CAPSULE EVERY DAY 03/27/13   Gaylord Shih, MD  HYDROcodone-acetaminophen (NORCO) 10-325 MG per tablet TAKE 1 TABLET BY MOUTH EVERY SIX HOURS AS NEEDED FOR PAIN 06/14/13   Ileana Ladd, MD  KLOR-CON M20 20 MEQ tablet TAKE 1 TABLET BY MOUTH DAILY 11/14/12   Gaylord Shih, MD  metoprolol (LOPRESSOR) 50 MG tablet Take 1 tablet (50 mg total) by mouth 2 (two) times daily. 06/09/12   Gaylord Shih, MD  Multiple Vitamin (MULTIVITAMIN) capsule Take 1 capsule by mouth daily.      Historical Provider, MD  nitroGLYCERIN (NITROSTAT) 0.4 MG SL tablet Place 0.4 mg under the tongue every 5 (five) minutes as needed. After 3 doses and no relief call 911 06/09/12   Gaylord Shih, MD  rosuvastatin (CRESTOR) 20 MG tablet Take 1 tablet (20 mg total) by mouth daily. 03/31/13   Ileana Ladd, MD     ROS: As above in the HPI. All other systems are stable or negative.  OBJECTIVE: APPEARANCE:  Patient in no acute distress.The patient appeared well nourished and normally developed. Acyanotic. Waist: VITAL SIGNS:BP 125/56  Pulse 54  Temp(Src) 97.1 F (36.2 C) (Oral)  Ht 5\' 5"  (1.651 m)  Wt 152 lb (68.947 kg)  BMI 25.29 kg/m2  WM  SKIN: warm and  Dry without overt rashes, tattoos and scars  HEAD and Neck: without JVD, Head and scalp: normal Eyes:No scleral icterus. Fundi normal, eye movements normal. Ears: Auricle normal, canal normal, Tympanic membranes normal, insufflation normal. Nose: normal Throat: normal Neck & thyroid: normal Carotid bruits  CHEST & LUNGS: Chest wall: normal Lungs: Clear  [facility]S: Reveals the PMI to be normally located. Regular  rhythm, First and Second Heart sounds are normal,  absence of murmurs, rubs or gallops. Peripheral vasculature: Radial pulses: normal Dorsal pedis pulses: normal Posterior pulses: normal  ABDOMEN:  Appearance: normal Benign, no organomegaly, no masses, no Abdominal Aortic enlargement. No Guarding , no rebound. No Bruits. Bowel sounds: normal  RECTAL: N/A GU: N/A  EXTREMETIES: nonedematous.  MUSCULOSKELETAL:  Spine: normal Joints: intact Myalgias.  NEUROLOGIC: oriented to time,place and person; nonfocal. Chronic neuropathic pain post chemnotherapyfeels referred pain all over his body.especially the lower extremities  ASSESSMENT: HYPERLIPIDEMIA - Plan: CMP14+EGFR, NMR, lipoprofile  HYPERTENSION, BENIGN - Plan: CMP14+EGFR  CAD, ARTERY BYPASS GRAFT  Occlusion and stenosis of carotid artery without mention of cerebral infarction, bilateral  ALLERGIC RHINITIS  Malignant neoplasm of upper lobe, bronchus or lung, unspecified laterality  Chronic pain syndrome   PLAN:  Orders Placed This Encounter  Procedures  . CMP14+EGFR  . NMR, lipoprofile   Continue with the same medications.  Keep active. See the specialists as planned and follow up with pulmonary and oncology.  Return in about 3 months (around 09/20/2013) for Recheck medical problems.   Yuki Purves P. [place]esto Charon, M.D.

## 2013-06-22 LAB — CMP14+EGFR
ALT: 20 IU/L (ref 0–44)
AST: 19 IU/L (ref 0–40)
Albumin/Globulin Ratio: 2.3 (ref 1.1–2.5)
Albumin: 4.9 g/dL — ABNORMAL HIGH (ref 3.6–4.8)
Alkaline Phosphatase: 51 IU/L (ref 39–117)
BUN/Creatinine Ratio: 13 (ref 10–22)
BUN: 11 mg/dL (ref 8–27)
CO2: 22 mmol/L (ref 18–29)
Calcium: 9.7 mg/dL (ref 8.6–10.2)
Chloride: 106 mmol/L (ref 97–108)
Creatinine, Ser: 0.84 mg/dL (ref 0.76–1.27)
GFR calc Af Amer: 105 mL/min/{1.73_m2} (ref >59)
GFR calc non Af Amer: 91 mL/min/{1.73_m2} (ref >59)
Globulin, Total: 2.1 g/dL (ref 1.5–4.5)
Glucose: 100 mg/dL — ABNORMAL HIGH (ref 65–99)
Potassium: 5.3 mmol/L — ABNORMAL HIGH (ref 3.5–5.2)
Sodium: 144 mmol/L (ref 134–144)
Total Bilirubin: 0.4 mg/dL (ref 0.0–1.2)
Total Protein: 7 g/dL (ref 6.0–8.5)

## 2013-06-23 LAB — NMR, LIPOPROFILE
Cholesterol: 145 mg/dL (ref <200)
HDL Cholesterol by NMR: 41 mg/dL (ref >=40)
HDL Particle Number: 41.9 umol/L (ref >=30.5)
LDL Particle Number: 1601 nmol/L — ABNORMAL HIGH (ref <1000)
LDL Size: 20.7 nm (ref >20.5)
LDLC SERPL CALC-MCNC: 72 mg/dL (ref <100)
LP-IR Score: 74 — ABNORMAL HIGH (ref <=45)
Small LDL Particle Number: 1005 nmol/L — ABNORMAL HIGH (ref <=527)
Triglycerides by NMR: 161 mg/dL — ABNORMAL HIGH (ref <150)

## 2013-07-12 ENCOUNTER — Other Ambulatory Visit: Payer: Self-pay | Admitting: Family Medicine

## 2013-07-14 ENCOUNTER — Other Ambulatory Visit: Payer: Self-pay | Admitting: Family Medicine

## 2013-07-14 NOTE — Telephone Encounter (Signed)
Prescription renewed in EPIC. 

## 2013-07-14 NOTE — Telephone Encounter (Signed)
[facility]  

## 2013-07-14 NOTE — Telephone Encounter (Signed)
Last seen 06/21/13  FPW

## 2013-07-15 ENCOUNTER — Telehealth: Payer: Self-pay | Admitting: Family Medicine

## 2013-07-15 ENCOUNTER — Other Ambulatory Visit: Payer: Self-pay | Admitting: Family Medicine

## 2013-07-15 MED ORDER — HYDROCODONE-ACETAMINOPHEN 10-325 MG PO TABS: ORAL_TABLET | ORAL | Status: DC

## 2013-07-18 ENCOUNTER — Ambulatory Visit (HOSPITAL_COMMUNITY)
Admission: RE | Admit: 2013-07-18 | Discharge: 2013-07-18 | Disposition: A | Discharge status: Home / Self Care | Payer: Medicare Other | Source: Ambulatory Visit | Attending: Radiation Oncology | Admitting: Radiation Oncology

## 2013-07-18 ENCOUNTER — Ambulatory Visit (HOSPITAL_COMMUNITY): Admission: RE | Admit: 2013-07-18 | Payer: Medicare Other | Source: Ambulatory Visit

## 2013-07-18 DIAGNOSIS — C3410 Malignant neoplasm of upper lobe, unspecified bronchus or lung: Secondary | ICD-10-CM

## 2013-07-18 DIAGNOSIS — I2510 Atherosclerotic heart disease of native coronary artery without angina pectoris: Secondary | ICD-10-CM | POA: Diagnosis not present

## 2013-07-18 DIAGNOSIS — Y842 Radiological procedure and radiotherapy as the cause of abnormal reaction of the patient, or of later complication, without mention of misadventure at the time of the procedure: Secondary | ICD-10-CM | POA: Insufficient documentation

## 2013-07-18 DIAGNOSIS — J701 Chronic and other pulmonary manifestations due to radiation: Secondary | ICD-10-CM | POA: Diagnosis not present

## 2013-07-18 DIAGNOSIS — J438 Other emphysema: Secondary | ICD-10-CM | POA: Diagnosis not present

## 2013-07-18 DIAGNOSIS — C3490 Malignant neoplasm of unspecified part of unspecified bronchus or lung: Secondary | ICD-10-CM | POA: Diagnosis not present

## 2013-07-18 MED ORDER — IOHEXOL 300 MG/ML  SOLN
80.0000 mL | Freq: Once | INTRAMUSCULAR | Status: AC | PRN
  Administered 2013-07-18: 80 mL via INTRAVENOUS

## 2013-07-21 ENCOUNTER — Ambulatory Visit: Payer: Medicare Other | Admitting: Radiation Oncology

## 2013-07-24 ENCOUNTER — Other Ambulatory Visit: Payer: Self-pay | Admitting: Cardiology

## 2013-07-26 ENCOUNTER — Ambulatory Visit
Admission: RE | Admit: 2013-07-26 | Discharge: 2013-07-26 | Disposition: A | Discharge status: Home / Self Care | Payer: Medicare Other | Source: Ambulatory Visit | Attending: Radiation Oncology | Admitting: Radiation Oncology

## 2013-07-26 ENCOUNTER — Encounter: Payer: Self-pay | Admitting: *Deleted

## 2013-07-26 ENCOUNTER — Telehealth: Payer: Self-pay | Admitting: *Deleted

## 2013-07-26 HISTORY — DX: Personal history of irradiation: Z92.3

## 2013-07-26 NOTE — Progress Notes (Signed)
   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  He is feeling well and doing well. His carotid stent placement was aborted early after he was found to collateral vessels. His neuropathy is stable and he has stable shortness of breath. CT from 10/13 shows stable treatment change in the right upper lobe and no evidence of lymphadenopathy.     Allergies:  Allergies  Allergen Reactions  . Demerol Nausea And Vomiting  . Clopidogrel Bisulfate     Severe rash  . Meperidine Hcl   . Penicillins     Rash   . Propoxyphene-Acetaminophen     nauesa    Medications:  Current Outpatient Prescriptions  Medication Sig Dispense Refill  . ALPRAZolam (XANAX) 1 MG tablet TAKE 1 TABLET BY MOUTH THREE TIMES DAILY  90 tablet  1  . amLODipine (NORVASC) 10 MG tablet TAKE 1 TABLET EVERY DAY  30 tablet  3  . aspirin 325 MG tablet Take 325 mg by mouth daily.        Marland Kitchen EFFIENT 10 MG TABS tablet TAKE 1 TABLET EVERY DAY  30 tablet  2  . fenofibrate 160 MG tablet TAKE 1 TABLET EVERY DAY  30 tablet  11  . gabapentin (NEURONTIN) 300 MG capsule TAKE 1 CAPSULE BY MOUTH EVERY EIGHT HOURS  90 capsule  2  . hydrochlorothiazide (MICROZIDE) 12.5 MG capsule TAKE 1 CAPSULE EVERY DAY  30 capsule  5  . HYDROcodone-acetaminophen (NORCO) 10-325 MG per tablet TAKE 1 TABLET BY MOUTH EVERY SIX HOURS AS NEEDED FOR PAIN  120 tablet  0  . KLOR-CON M20 20 MEQ tablet TAKE 1 TABLET BY MOUTH DAILY  30 tablet  11  . metoprolol (LOPRESSOR) 50 MG tablet Take 1 tablet (50 mg total) by mouth 2 (two) times daily.  180 tablet  3  . Multiple Vitamin (MULTIVITAMIN) capsule  Take 1 capsule by mouth daily.        . rosuvastatin (CRESTOR) 20 MG tablet Take 1 tablet (20 mg total) by mouth daily.  30 tablet  5  . nitroGLYCERIN (NITROSTAT) 0.4 MG SL tablet Place 0.4 mg under the tongue every 5 (five) minutes as needed. After 3 doses and no relief call 911       No current facility-administered medications for this encounter.    Physical Exam:  Filed Vitals:   07/26/13 1321  BP: 135/66  Pulse: 74  Temp: 97.8 F (36.6 C)  Resp: 20   No oxygen. No respiratory distress. ECOG 1.   IMPRESSION: [name] is a 67 y.o. male s/p RT alone for stage II Lung cancer with NED at 4 years out from treatment  PLAN:  Looks great. Scan shows NED. Follow up in 1 year with non contrast CT per NCCN guidelines.     Lurline Hare, MD

## 2013-07-26 NOTE — Progress Notes (Signed)
Follow up s/p Lung ca rad txs 11/15/08-02/23/09 RUL lung, recent Ct chest 07/18/13 here for results, has pain in legs, neuropathy, 100% room air sats, eating well, some fatigue at times, no difficulty swallowing on heart diet, last seen Dr. Daleen Squibb 07/05/13, Heart Md, will start to see Dr. Excell Seltzer in the future, steady gait  1:26 PM

## 2013-07-26 NOTE — Telephone Encounter (Signed)
CALLED PATIENT TO INFORM OF TEST, SPOKE WITH PATIENT AND HE IS AWARE OF THIS TEST. 

## 2013-07-28 ENCOUNTER — Ambulatory Visit: Payer: Medicare Other | Admitting: Radiation Oncology

## 2013-07-29 ENCOUNTER — Ambulatory Visit (HOSPITAL_COMMUNITY): Payer: Medicare Other

## 2013-08-08 ENCOUNTER — Other Ambulatory Visit: Payer: Self-pay | Admitting: Family Medicine

## 2013-08-10 NOTE — Telephone Encounter (Signed)
Last seen 06/21/13  [facility]   If approved route [facility]

## 2013-08-11 NOTE — Telephone Encounter (Signed)
Rx ready for nurse to Phone in. 

## 2013-08-11 NOTE — Telephone Encounter (Signed)
rx called to [facility]

## 2013-08-16 ENCOUNTER — Telehealth: Payer: Self-pay | Admitting: Family Medicine

## 2013-08-16 MED ORDER — HYDROCODONE-ACETAMINOPHEN 10-325 MG PO TABS: ORAL_TABLET | ORAL | Status: DC

## 2013-08-16 NOTE — Telephone Encounter (Signed)
rx ready for pickup 

## 2013-08-16 NOTE — Telephone Encounter (Signed)
patient aware

## 2013-08-21 ENCOUNTER — Other Ambulatory Visit: Payer: Self-pay | Admitting: Cardiology

## 2013-09-07 ENCOUNTER — Other Ambulatory Visit: Payer: Self-pay | Admitting: Family Medicine

## 2013-09-09 NOTE — Telephone Encounter (Signed)
[facility] al[facility]am and left on their voice mail

## 2013-09-09 NOTE — Telephone Encounter (Signed)
Rx ready for nurse to Phone in. 

## 2013-09-09 NOTE — Telephone Encounter (Signed)
Last seen 06/21/13, last filled 08/08/13. Route to pool A, [facility]

## 2013-09-12 ENCOUNTER — Other Ambulatory Visit: Payer: Self-pay | Admitting: Family Medicine

## 2013-09-12 ENCOUNTER — Telehealth: Payer: Self-pay | Admitting: Family Medicine

## 2013-09-12 MED ORDER — HYDROCODONE-ACETAMINOPHEN 10-325 MG PO TABS: ORAL_TABLET | ORAL | Status: DC

## 2013-09-12 NOTE — Telephone Encounter (Signed)
Pt aware rx for hydrocodone ready for pick up

## 2013-09-12 NOTE — Telephone Encounter (Signed)
Rx ready for pick up. 

## 2013-09-16 ENCOUNTER — Other Ambulatory Visit: Payer: Self-pay | Admitting: Cardiology

## 2013-09-23 ENCOUNTER — Ambulatory Visit (INDEPENDENT_AMBULATORY_CARE_PROVIDER_SITE_OTHER): Payer: Medicare Other | Admitting: Family Medicine

## 2013-09-23 ENCOUNTER — Encounter (INDEPENDENT_AMBULATORY_CARE_PROVIDER_SITE_OTHER): Payer: Self-pay

## 2013-09-23 ENCOUNTER — Encounter: Payer: Self-pay | Admitting: Family Medicine

## 2013-09-23 VITALS — BP 110/57 | HR 65 | Temp 98.3°F | Ht 65.0 in | Wt 149.8 lb

## 2013-09-23 DIAGNOSIS — E785 Hyperlipidemia, unspecified: Secondary | ICD-10-CM

## 2013-09-23 DIAGNOSIS — L259 Unspecified contact dermatitis, unspecified cause: Secondary | ICD-10-CM

## 2013-09-23 DIAGNOSIS — J309 Allergic rhinitis, unspecified: Secondary | ICD-10-CM

## 2013-09-23 DIAGNOSIS — D696 Thrombocytopenia, unspecified: Secondary | ICD-10-CM

## 2013-09-23 DIAGNOSIS — G894 Chronic pain syndrome: Secondary | ICD-10-CM | POA: Diagnosis not present

## 2013-09-23 DIAGNOSIS — I6529 Occlusion and stenosis of unspecified carotid artery: Secondary | ICD-10-CM | POA: Diagnosis not present

## 2013-09-23 DIAGNOSIS — I25810 Atherosclerosis of coronary artery bypass graft(s) without angina pectoris: Secondary | ICD-10-CM | POA: Diagnosis not present

## 2013-09-23 DIAGNOSIS — R222 Localized swelling, mass and lump, trunk: Secondary | ICD-10-CM

## 2013-09-23 DIAGNOSIS — I10 Essential (primary) hypertension: Secondary | ICD-10-CM

## 2013-09-23 DIAGNOSIS — R58 Hemorrhage, not elsewhere classified: Secondary | ICD-10-CM

## 2013-09-23 DIAGNOSIS — I998 Other disorder of circulatory system: Secondary | ICD-10-CM

## 2013-09-23 DIAGNOSIS — Z23 Encounter for immunization: Secondary | ICD-10-CM | POA: Diagnosis not present

## 2013-09-23 DIAGNOSIS — L309 Dermatitis, unspecified: Secondary | ICD-10-CM

## 2013-09-23 DIAGNOSIS — F411 Generalized anxiety disorder: Secondary | ICD-10-CM

## 2013-09-23 NOTE — Patient Instructions (Addendum)
Dr Woodroe Mode Recommendations  For nutrition information, I recommend books:  1).Eat to Live by Dr Monico Hoar. 2).Prevent and Reverse Heart Disease by Dr Suzzette Righter. 3) Dr Katherina Right Book:  Program to Reverse Diabetes  Exercise recommendations are:  If unable to walk, then the patient can exercise in a chair 3 times a day. By flapping arms like a bird gently and raising legs outwards to the front.  If ambulatory, the patient can go for walks for 30 minutes 3 times a week. Then increase the intensity and duration as tolerated.  Goal is to try to attain exercise frequency to 5 times a week.  If applicable: Best to perform resistance exercises (machines or weights) 2 days a week and cardio type exercises 3 days per week. Influenza Virus Vaccine injection (Fluarix) What is this medicine? INFLUENZA VIRUS VACCINE (in floo EN zuh VAHY ruhs vak SEEN) helps to reduce the risk of getting influenza also known as the flu. This medicine may be used for other purposes; ask your health care provider or pharmacist if you have questions. COMMON BRAND NAME(S): Fluarix, Fluzone What should I tell my health care provider before I take this medicine? They need to know if you have any of these conditions: -bleeding disorder like hemophilia -fever or infection -Guillain-Barre syndrome or other neurological problems -immune system problems -infection with the human immunodeficiency virus (HIV) or AIDS -low blood platelet counts -multiple sclerosis -an unusual or allergic reaction to influenza virus vaccine, eggs, chicken proteins, latex, gentamicin, other medicines, foods, dyes or preservatives -pregnant or trying to get pregnant -breast-feeding How should I use this medicine? This vaccine is for injection into a muscle. It is given by a health care professional. A copy of Vaccine Information Statements will be given before each vaccination. Read this sheet carefully each time. The  sheet may change frequently. Talk to your pediatrician regarding the use of this medicine in children. Special care may be needed. Overdosage: If you think you have taken too much of this medicine contact a poison control center or emergency room at once. NOTE: This medicine is only for you. Do not share this medicine with others. What if I miss a dose? This does not apply. What may interact with this medicine? -chemotherapy or radiation therapy -medicines that lower your immune system like etanercept, anakinra, infliximab, and adalimumab -medicines that treat or prevent blood clots like warfarin -phenytoin -steroid medicines like prednisone or cortisone -theophylline -vaccines This list may not describe all possible interactions. Give your health care provider a list of all the medicines, herbs, non-prescription drugs, or dietary supplements you use. Also tell them if you smoke, drink alcohol, or use illegal drugs. Some items may interact with your medicine. What should I watch for while using this medicine? Report any side effects that do not go away within 3 days to your doctor or health care professional. Call your health care provider if any unusual symptoms occur within 6 weeks of receiving this vaccine. You may still catch the flu, but the illness is not usually as bad. You cannot get the flu from the vaccine. The vaccine will not protect against colds or other illnesses that may cause fever. The vaccine is needed every year. What side effects may I notice from receiving this medicine? Side effects that you should report to your doctor or health care professional as soon as possible: -allergic reactions like skin rash, itching or hives, swelling of the face, lips, or  tongue Side effects that usually do not require medical attention (report to your doctor or health care professional if they continue or are bothersome): -fever -headache -muscle aches and pains -pain, tenderness,  redness, or swelling at site where injected -weak or tired This list may not describe all possible side effects. Call your doctor for medical advice about side effects. You may report side effects to FDA at 1-800-FDA-1088. Where should I keep my medicine? This vaccine is only given in a clinic, pharmacy, doctor's office, or other health care setting and will not be stored at home. NOTE: This sheet is a summary. It may not cover all possible information. If you have questions about this medicine, talk to your doctor, pharmacist, or health care provider.  2014, Elsevier/Gold Standard. (2008-04-19 09:30:40)

## 2013-09-24 DIAGNOSIS — R58 Hemorrhage, not elsewhere classified: Secondary | ICD-10-CM | POA: Insufficient documentation

## 2013-09-24 DIAGNOSIS — Z23 Encounter for immunization: Secondary | ICD-10-CM | POA: Insufficient documentation

## 2013-09-24 DIAGNOSIS — L309 Dermatitis, unspecified: Secondary | ICD-10-CM | POA: Insufficient documentation

## 2013-09-24 DIAGNOSIS — E785 Hyperlipidemia, unspecified: Secondary | ICD-10-CM | POA: Insufficient documentation

## 2013-09-24 LAB — CBC WITH DIFFERENTIAL
Basophils Absolute: 0 10*3/uL (ref 0.0–0.2)
Basos: 0 %
Eos: 2 %
Eosinophils Absolute: 0.2 10*3/uL (ref 0.0–0.4)
HCT: 39 % (ref 37.5–51.0)
Hemoglobin: 13.3 g/dL (ref 12.6–17.7)
Immature Grans (Abs): 0 10*3/uL (ref 0.0–0.1)
Immature Granulocytes: 0 %
Lymphocytes Absolute: 0.9 10*3/uL (ref 0.7–3.1)
Lymphs: 11 %
MCH: 30.4 pg (ref 26.6–33.0)
MCHC: 34.1 g/dL (ref 31.5–35.7)
MCV: 89 fL (ref 79–97)
Monocytes Absolute: 0.5 10*3/uL (ref 0.1–0.9)
Monocytes: 6 %
Neutrophils Absolute: 6.6 10*3/uL (ref 1.4–7.0)
Neutrophils Relative %: 81 %
Platelets: 133 10*3/uL — ABNORMAL LOW (ref 150–379)
RBC: 4.37 x10E6/uL (ref 4.14–5.80)
RDW: 13.6 % (ref 12.3–15.4)
WBC: 8.3 10*3/uL (ref 3.4–10.8)

## 2013-09-24 NOTE — Progress Notes (Signed)
Patient ID: [name], male   DOB: [birth year], 66 y.o.   MRN: [id number] SUBJECTIVE: CC: Chief Complaint  Patient presents with  . Hypertension  . Coronary Artery Disease  . Hyperlipidemia  . chronic pain syndrome    HPI:  Patient is here for follow up of hyperlipidemia/HTN/CAD: denies Headache;denies Chest Pain;denies weakness;denies Shortness of Breath and orthopnea;denies Visual changes;denies palpitations;denies cough;denies pedal edema;denies symptoms of TIA or stroke;deniesClaudication symptoms. admits to Compliance with medications; denies Problems with medications.  Patient has been on anticoagulant drug effient. He also has a h/o thrombocytopenia. He has easy bruising and a rash on the legs and dry itchy skin of the shins.  Past Medical History  Diagnosis Date  . Lung cancer     lung ca dx 1/10  . Hypertension   . Hyperlipidemia   . CAD (coronary artery disease)     Dr. Daleen Squibb  - Cardiologist   . Dementia   . PVD (peripheral vascular disease)   . ASCVD (arteriosclerotic cardiovascular disease)   . Anxiety     Dr. Malena Catholic    . OA (osteoarthritis)   . Neuropathic pain   . History of radiation therapy 11/15/08-02/23/09    RUL 66Gy/9fx   Past Surgical History  Procedure Laterality Date  . Coronary artery bypass graft  7/95    x5 ([facility] )  . Carotid endarterectomy  12/98    right ([facility])  . Cardiac catheterization  12/20/04    recurrent    History   Social History  . Marital Status: Married    Spouse Name: N/A    Number of Children: N/A  . Years of Education: N/A   Occupational History  . Not on file.   Social History Main Topics  . Smoking status: Former Games developer  . Smokeless tobacco: Not on file  . Alcohol Use: No  . Drug Use:   . Sexual Activity:    Other Topics Concern  . Not on file   Social History Narrative  . No narrative on file   Family History  Problem Relation Age of Onset  . Heart attack Mother   . Heart disease Father   .  Heart disease Sister   . Heart disease Brother   . Coronary artery disease Other    Current Outpatient Prescriptions on File Prior to Visit  Medication Sig Dispense Refill  . ALPRAZolam (XANAX) 1 MG tablet TAKE 1 TABLET THREE TIMES A DAY  90 tablet  0  . amLODipine (NORVASC) 10 MG tablet TAKE 1 TABLET EVERY DAY  30 tablet  3  . aspirin 325 MG tablet Take 325 mg by mouth daily.        Marland Kitchen EFFIENT 10 MG TABS tablet TAKE 1 TABLET EVERY DAY  30 tablet  2  . fenofibrate 160 MG tablet TAKE 1 TABLET EVERY DAY  30 tablet  11  . gabapentin (NEURONTIN) 300 MG capsule TAKE 1 CAPSULE BY MOUTH EVERY EIGHT HOURS  90 capsule  2  . hydrochlorothiazide (MICROZIDE) 12.5 MG capsule TAKE 1 CAPSULE EVERY DAY  30 capsule  2  . HYDROcodone-acetaminophen (NORCO) 10-325 MG per tablet TAKE 1 TABLET BY MOUTH EVERY SIX HOURS AS NEEDED FOR PAIN  120 tablet  0  . KLOR-CON M20 20 MEQ tablet TAKE 1 TABLET BY MOUTH DAILY  30 tablet  11  . metoprolol (LOPRESSOR) 50 MG tablet TAKE 1 TABLET (50 MG TOTAL) BY MOUTH 2 (TWO) TIMES DAILY.  180 tablet  0  . Multiple  Vitamin (MULTIVITAMIN) capsule Take 1 capsule by mouth daily.        . nitroGLYCERIN (NITROSTAT) 0.4 MG SL tablet Place 0.4 mg under the tongue every 5 (five) minutes as needed. After 3 doses and no relief call 911      . rosuvastatin (CRESTOR) 20 MG tablet Take 1 tablet (20 mg total) by mouth daily.  30 tablet  5   No current facility-administered medications on file prior to visit.   Allergies  Allergen Reactions  . Demerol Nausea And Vomiting  . Clopidogrel Bisulfate     Severe rash  . Meperidine Hcl   . Penicillins     Rash   . Propoxyphene-Acetaminophen     nauesa   Immunization History  Administered Date(s) Administered  . Influenza,inj,Quad PF,36+ Mos 09/23/2013  . Pneumococcal Polysaccharide-23 05/07/2011  . Tdap 08/19/2010  . Zoster 05/06/2012   Prior to Admission medications   Medication Sig Start Date End Date Taking? Authorizing Provider   ALPRAZolam Prudy Feeler) 1 MG tablet TAKE 1 TABLET THREE TIMES A DAY 09/07/13  Yes Ileana Ladd, MD  amLODipine (NORVASC) 10 MG tablet TAKE 1 TABLET EVERY DAY 07/24/13  Yes Tonny Bollman, MD  aspirin 325 MG tablet Take 325 mg by mouth daily.     Yes Historical Provider, MD  EFFIENT 10 MG TABS tablet TAKE 1 TABLET EVERY DAY 07/12/13  Yes Ileana Ladd, MD  fenofibrate 160 MG tablet TAKE 1 TABLET EVERY DAY 03/17/13  Yes Ileana Ladd, MD  gabapentin (NEURONTIN) 300 MG capsule TAKE 1 CAPSULE BY MOUTH EVERY EIGHT HOURS 05/03/13  Yes Ileana Ladd, MD  hydrochlorothiazide (MICROZIDE) 12.5 MG capsule TAKE 1 CAPSULE EVERY DAY 09/16/13  Yes Tonny Bollman, MD  HYDROcodone-acetaminophen (NORCO) 10-325 MG per tablet TAKE 1 TABLET BY MOUTH EVERY SIX HOURS AS NEEDED FOR PAIN 09/12/13  Yes Ileana Ladd, MD  KLOR-CON M20 20 MEQ tablet TAKE 1 TABLET BY MOUTH DAILY 11/14/12  Yes Gaylord Shih, MD  metoprolol (LOPRESSOR) 50 MG tablet TAKE 1 TABLET (50 MG TOTAL) BY MOUTH 2 (TWO) TIMES DAILY. 08/21/13  Yes Tonny Bollman, MD  Multiple Vitamin (MULTIVITAMIN) capsule Take 1 capsule by mouth daily.     Yes Historical Provider, MD  nitroGLYCERIN (NITROSTAT) 0.4 MG SL tablet Place 0.4 mg under the tongue every 5 (five) minutes as needed. After 3 doses and no relief call 911 06/09/12  Yes Gaylord Shih, MD  rosuvastatin (CRESTOR) 20 MG tablet Take 1 tablet (20 mg total) by mouth daily. 03/31/13  Yes Ileana Ladd, MD     ROS: As above in the HPI. All other systems are stable or negative.  OBJECTIVE: APPEARANCE:  Patient in no acute distress.The patient appeared well nourished and normally developed. Acyanotic. Waist: VITAL SIGNS:BP 110/57  Pulse 65  Temp(Src) 98.3 F (36.8 C) (Oral)  Ht 5\' 5"  (1.651 m)  Wt 149 lb 12.8 oz (67.949 kg)  BMI 24.93 kg/m2  WM  SKIN: warm and  Dry without, tattoos and scars Rash: dry scaly skin of the legs and small purpuric areas from scratches. Also small ecchymotic areas on the  forearms.  HEAD and Neck: without JVD, Head and scalp: normal Eyes:No scleral icterus. Fundi normal, eye movements normal. Ears: Auricle normal, canal normal, Tympanic membranes normal, insufflation normal. Nose: normal Throat: normal Neck & thyroid: normal  CHEST & LUNGS: Chest wall: normal Lungs: Clear  CVS: Reveals the PMI to be normally located. Regular rhythm, First and Second Heart  sounds are normal,  absence of murmurs, rubs or gallops. Peripheral vasculature: Radial pulses: normal Dorsal pedis pulses: normal Posterior pulses: normal  ABDOMEN:  Appearance: normal Benign, no organomegaly, no masses, no Abdominal Aortic enlargement. No Guarding , no rebound. No Bruits. Bowel sounds: normal  RECTAL: N/A GU: N/A  EXTREMETIES: nonedematous.  MUSCULOSKELETAL:  Spine: normal Joints: intact  NEUROLOGIC: oriented to time,place and person; nonfocal. Strength is normal Sensory is normal Reflexes are normal Cranial Nerves are normal.  ASSESSMENT:  Essential hypertension, benign - Plan: CMP14+EGFR  Chronic pain syndrome  Hyperlipemia - Plan: CMP14+EGFR, NMR, lipoprofile  Coronary atherosclerosis of artery bypass graft - Plan: CBC With differential/Platelet  ALLERGIC RHINITIS  THROMBOCYTOPENIA - Plan: CBC With differential/Platelet, CANCELED: POCT CBC  Swelling, mass, or lump in chest - Plan: CBC With differential/Platelet  HYPERLIPIDEMIA  Occlusion and stenosis of carotid artery without mention of cerebral infarction, unspecified laterality - Plan: CBC With differential/Platelet  Anxiety state, unspecified  Need for prophylactic vaccination and inoculation against influenza  Mild eczema  Ecchymosis  PLAN:      Dr Woodroe Mode Recommendations  For nutrition information, I recommend books:  1).Eat to Live by Dr Monico Hoar. 2).Prevent and Reverse Heart Disease by Dr Suzzette Righter. 3) Dr Katherina Right Book:  Program to Reverse  Diabetes  Exercise recommendations are:  If unable to walk, then the patient can exercise in a chair 3 times a day. By flapping arms like a bird gently and raising legs outwards to the front.  If ambulatory, the patient can go for walks for 30 minutes 3 times a week. Then increase the intensity and duration as tolerated.  Goal is to try to attain exercise frequency to 5 times a week.  If applicable: Best to perform resistance exercises (machines or weights) 2 days a week and cardio type exercises 3 days per week.   Results for orders placed in visit on 09/23/13  CBC WITH DIFFERENTIAL/PLATELET      Result Value Range   WBC 8.3  3.4 - 10.8 x10E3/uL   RBC 4.37  4.14 - 5.80 x10E6/uL   Hemoglobin 13.3  12.6 - 17.7 g/dL   HCT 16.1  09.6 - 04.5 %   MCV 89  79 - 97 fL   MCH 30.4  26.6 - 33.0 pg   MCHC 34.1  31.5 - 35.7 g/dL   RDW 40.9  81.1 - 91.4 %   Platelets 133 (*) 150 - 379 x10E3/uL   Neutrophils Relative % 81     Lymphs 11     Monocytes 6     Eos 2     Basos 0     Neutrophils Absolute 6.6  1.4 - 7.0 x10E3/uL   Lymphocytes Absolute 0.9  0.7 - 3.1 x10E3/uL   Monocytes Absolute 0.5  0.1 - 0.9 x10E3/uL   Eosinophils Absolute 0.2  0.0 - 0.4 x10E3/uL   Basophils Absolute 0.0  0.0 - 0.2 x10E3/uL   Immature Granulocytes 0     Immature Grans (Abs) 0.0  0.0 - 0.1 x10E3/uL   Discussed his CBC results. Stable  OTC HC ointment to legs and moisturizing cream. Reassurance in regards to the ecchymosis of his forearms. He will discuss with his Cardiologist next week in regards to ongoing treatment with the effient.  Orders Placed This Encounter  Procedures  . CMP14+EGFR  . NMR, lipoprofile  . CBC With differential/Platelet   No orders of the defined types were placed in this encounter.   There are  no discontinued medications. Return in about 3 months (around 12/22/2013) for Recheck medical problems.  Louvina Cleary P. Modesto Charon, M.D.

## 2013-09-25 LAB — CMP14+EGFR
ALT: 15 IU/L (ref 0–44)
AST: 18 IU/L (ref 0–40)
Albumin/Globulin Ratio: 2.2 (ref 1.1–2.5)
Albumin: 4.4 g/dL (ref 3.6–4.8)
Alkaline Phosphatase: 50 IU/L (ref 39–117)
BUN/Creatinine Ratio: 15 (ref 10–22)
BUN: 14 mg/dL (ref 8–27)
CO2: 23 mmol/L (ref 18–29)
Calcium: 9.4 mg/dL (ref 8.6–10.2)
Chloride: 105 mmol/L (ref 97–108)
Creatinine, Ser: 0.94 mg/dL (ref 0.76–1.27)
GFR calc Af Amer: 97 mL/min/{1.73_m2} (ref >59)
GFR calc non Af Amer: 84 mL/min/{1.73_m2} (ref >59)
Globulin, Total: 2 g/dL (ref 1.5–4.5)
Glucose: 94 mg/dL (ref 65–99)
Potassium: 4.5 mmol/L (ref 3.5–5.2)
Sodium: 141 mmol/L (ref 134–144)
Total Bilirubin: 0.3 mg/dL (ref 0.0–1.2)
Total Protein: 6.4 g/dL (ref 6.0–8.5)

## 2013-09-25 LAB — NMR, LIPOPROFILE
Cholesterol: 135 mg/dL (ref <200)
HDL Cholesterol by NMR: 38 mg/dL — ABNORMAL LOW (ref >=40)
HDL Particle Number: 37.9 umol/L (ref >=30.5)
LDL Particle Number: 1502 nmol/L — ABNORMAL HIGH (ref <1000)
LDL Size: 20.6 nm (ref >20.5)
LDLC SERPL CALC-MCNC: 74 mg/dL (ref <100)
LP-IR Score: 51 — ABNORMAL HIGH (ref <=45)
Small LDL Particle Number: 923 nmol/L — ABNORMAL HIGH (ref <=527)
Triglycerides by NMR: 115 mg/dL (ref <150)

## 2013-10-10 ENCOUNTER — Telehealth: Payer: Self-pay | Admitting: Family Medicine

## 2013-10-11 ENCOUNTER — Other Ambulatory Visit: Payer: Self-pay | Admitting: Family Medicine

## 2013-10-14 ENCOUNTER — Other Ambulatory Visit: Payer: Self-pay | Admitting: Family Medicine

## 2013-10-14 MED ORDER — ALPRAZOLAM 1 MG PO TABS: 1.0000 mg | ORAL_TABLET | Freq: Three times a day (TID) | ORAL | Status: DC | PRN

## 2013-10-14 MED ORDER — HYDROCODONE-ACETAMINOPHEN 10-325 MG PO TABS: ORAL_TABLET | ORAL | Status: DC

## 2013-10-14 NOTE — Telephone Encounter (Signed)
Pt would like to pick up rx on Saturday am

## 2013-10-17 ENCOUNTER — Other Ambulatory Visit: Payer: Self-pay | Admitting: Family Medicine

## 2013-10-17 ENCOUNTER — Other Ambulatory Visit: Payer: Self-pay | Admitting: Cardiology

## 2013-10-17 DIAGNOSIS — F411 Generalized anxiety disorder: Secondary | ICD-10-CM

## 2013-10-17 DIAGNOSIS — G894 Chronic pain syndrome: Secondary | ICD-10-CM

## 2013-10-17 MED ORDER — HYDROCODONE-ACETAMINOPHEN 10-325 MG PO TABS: ORAL_TABLET | ORAL | Status: DC

## 2013-10-17 MED ORDER — ALPRAZOLAM 1 MG PO TABS: 1.0000 mg | ORAL_TABLET | Freq: Three times a day (TID) | ORAL | Status: DC | PRN

## 2013-10-17 NOTE — Telephone Encounter (Signed)
Rx ready for pick up. 

## 2013-10-17 NOTE — Telephone Encounter (Signed)
Patient aware that rx up front to be picked up

## 2013-10-28 ENCOUNTER — Encounter: Payer: Self-pay | Admitting: Family Medicine

## 2013-10-28 ENCOUNTER — Telehealth: Payer: Self-pay | Admitting: Family Medicine

## 2013-10-28 ENCOUNTER — Ambulatory Visit (INDEPENDENT_AMBULATORY_CARE_PROVIDER_SITE_OTHER): Payer: Medicare Other | Admitting: Family Medicine

## 2013-10-28 VITALS — BP 126/60 | HR 65 | Temp 97.4°F | Ht 65.0 in | Wt 154.2 lb

## 2013-10-28 DIAGNOSIS — E785 Hyperlipidemia, unspecified: Secondary | ICD-10-CM | POA: Diagnosis not present

## 2013-10-28 DIAGNOSIS — I25810 Atherosclerosis of coronary artery bypass graft(s) without angina pectoris: Secondary | ICD-10-CM

## 2013-10-28 DIAGNOSIS — I10 Essential (primary) hypertension: Secondary | ICD-10-CM

## 2013-10-28 DIAGNOSIS — B86 Scabies: Secondary | ICD-10-CM | POA: Diagnosis not present

## 2013-10-28 DIAGNOSIS — I6529 Occlusion and stenosis of unspecified carotid artery: Secondary | ICD-10-CM

## 2013-10-28 DIAGNOSIS — R21 Rash and other nonspecific skin eruption: Secondary | ICD-10-CM | POA: Insufficient documentation

## 2013-10-28 MED ORDER — PERMETHRIN 5 % EX CREA: 1.0000 "application " | TOPICAL_CREAM | Freq: Once | CUTANEOUS | Status: DC

## 2013-10-28 MED ORDER — PREDNISONE 20 MG PO TABS: 40.0000 mg | ORAL_TABLET | Freq: Every day | ORAL | Status: DC

## 2013-10-28 NOTE — Telephone Encounter (Signed)
Appt given for today per patient  request

## 2013-10-28 NOTE — Progress Notes (Signed)
Patient ID: [name], male   DOB: Jul 26, 1946, 68 y.o.   MRN: [id number] SUBJECTIVE: CC: Chief Complaint  Patient presents with  . Acute Visit    ? shingles left back area  rash on arms  groin area     HPI:  Rash:intense itching. Visited someone at their home several weeks ago and they were wrapped up in sheets.they had some sort of itching rash. He has broken out for several weeks now and it is spreading also on his penis.small bumps and intensely itching. He scratches until it bleeds.  wife breaking out as well.  Past Medical History  Diagnosis Date  . Lung cancer     lung ca dx 1/10  . Hypertension   . Hyperlipidemia   . CAD (coronary artery disease)     Dr. Verl Blalock  - Cardiologist   . Dementia   . PVD (peripheral vascular disease)   . ASCVD (arteriosclerotic cardiovascular disease)   . Anxiety     Dr. Corrinne Eagle    . OA (osteoarthritis)   . Neuropathic pain   . History of radiation therapy 11/15/08-02/23/09    RUL 66Gy/24fx   Past Surgical History  Procedure Laterality Date  . Coronary artery bypass graft  7/95    x5 ([facility] )  . Carotid endarterectomy  12/98    right ([facility])  . Cardiac catheterization  12/20/04    recurrent    History   Social History  . Marital Status: Married    Spouse Name: N/A    Number of Children: N/A  . Years of Education: N/A   Occupational History  . Not on file.   Social History Main Topics  . Smoking status: Former Research scientist (life sciences)  . Smokeless tobacco: Not on file  . Alcohol Use: No  . Drug Use:   . Sexual Activity:    Other Topics Concern  . Not on file   Social History Narrative  . No narrative on file   Family History  Problem Relation Age of Onset  . Heart attack Mother   . Heart disease Father   . Heart disease Sister   . Heart disease Brother   . Coronary artery disease Other    Current Outpatient Prescriptions on File Prior to Visit  Medication Sig Dispense Refill  . ALPRAZolam (XANAX) 1 MG tablet Take 1 tablet (1  mg total) by mouth 3 (three) times daily as needed for anxiety.  90 tablet  1  . amLODipine (NORVASC) 10 MG tablet TAKE 1 TABLET EVERY DAY  30 tablet  3  . aspirin 325 MG tablet Take 325 mg by mouth daily.        Marland Kitchen EFFIENT 10 MG TABS tablet TAKE 1 TABLET EVERY DAY  30 tablet  2  . fenofibrate 160 MG tablet TAKE 1 TABLET EVERY DAY  30 tablet  11  . gabapentin (NEURONTIN) 300 MG capsule TAKE 1 CAPSULE BY MOUTH EVERY EIGHT HOURS  90 capsule  2  . hydrochlorothiazide (MICROZIDE) 12.5 MG capsule TAKE 1 CAPSULE EVERY DAY  30 capsule  2  . HYDROcodone-acetaminophen (NORCO) 10-325 MG per tablet TAKE 1 TABLET BY MOUTH EVERY SIX HOURS AS NEEDED FOR PAIN  120 tablet  0  . KLOR-CON M20 20 MEQ tablet TAKE 1 TABLET BY MOUTH DAILY  30 tablet  0  . metoprolol (LOPRESSOR) 50 MG tablet TAKE 1 TABLET (50 MG TOTAL) BY MOUTH 2 (TWO) TIMES DAILY.  180 tablet  0  . Multiple Vitamin (MULTIVITAMIN) capsule  Take 1 capsule by mouth daily.        . nitroGLYCERIN (NITROSTAT) 0.4 MG SL tablet Place 0.4 mg under the tongue every 5 (five) minutes as needed. After 3 doses and no relief call 911      . rosuvastatin (CRESTOR) 20 MG tablet Take 1 tablet (20 mg total) by mouth daily.  30 tablet  5   No current facility-administered medications on file prior to visit.   Allergies  Allergen Reactions  . Demerol Nausea And Vomiting  . Clopidogrel Bisulfate     Severe rash  . Meperidine Hcl   . Penicillins     Rash   . Propoxyphene N-Acetaminophen     nauesa   Immunization History  Administered Date(s) Administered  . Influenza,inj,Quad PF,36+ Mos 09/23/2013  . Pneumococcal Polysaccharide-23 05/07/2011  . Tdap 08/19/2010  . Zoster 05/06/2012   Prior to Admission medications   Medication Sig Start Date End Date Taking? Authorizing Provider  ALPRAZolam Duanne Moron) 1 MG tablet Take 1 tablet (1 mg total) by mouth 3 (three) times daily as needed for anxiety. 10/17/13   Vernie Shanks, MD  amLODipine (NORVASC) 10 MG tablet TAKE 1  TABLET EVERY DAY 07/24/13   [name], MD  aspirin 325 MG tablet Take 325 mg by mouth daily.      Historical Provider, MD  EFFIENT 10 MG TABS tablet TAKE 1 TABLET EVERY DAY 10/11/13   Vernie Shanks, MD  fenofibrate 160 MG tablet TAKE 1 TABLET EVERY DAY 03/17/13   Vernie Shanks, MD  gabapentin (NEURONTIN) 300 MG capsule TAKE 1 CAPSULE BY MOUTH EVERY EIGHT HOURS 10/11/13   Vernie Shanks, MD  hydrochlorothiazide (MICROZIDE) 12.5 MG capsule TAKE 1 CAPSULE EVERY DAY 09/16/13   [name], MD  HYDROcodone-acetaminophen ([place]) 10-325 MG per tablet TAKE 1 TABLET BY MOUTH EVERY SIX HOURS AS NEEDED FOR PAIN 10/17/13   Vernie Shanks, MD  KLOR-CON M20 20 MEQ tablet TAKE 1 TABLET BY MOUTH DAILY 10/17/13   [name], MD  metoprolol (LOPRESSOR) 50 MG tablet TAKE 1 TABLET (50 MG TOTAL) BY MOUTH 2 (TWO) TIMES DAILY. 08/21/13   [name], MD  Multiple Vitamin (MULTIVITAMIN) capsule Take 1 capsule by mouth daily.      Historical Provider, MD  nitroGLYCERIN (NITROSTAT) 0.4 MG SL tablet Place 0.4 mg under the tongue every 5 (five) minutes as needed. After 3 doses and no relief call 911 06/09/12   Renella Cunas, MD  rosuvastatin (CRESTOR) 20 MG tablet Take 1 tablet (20 mg total) by mouth daily. 03/31/13   Vernie Shanks, MD     ROS: As above in the HPI. All other systems are stable or negative.  OBJECTIVE: APPEARANCE:  Patient in no acute distress.The patient appeared well nourished and normally developed. Acyanotic. Waist: VITAL SIGNS:BP 126/60  Pulse 65  Temp(Src) 97.4 F (36.3 C) (Oral)  Ht 5\' 5"  (1.651 m)  Wt 154 lb 3.2 oz (69.945 kg)  BMI 25.66 kg/m2   SKIN: warm and  Dry  tattoos and scars Rash: papular rash with deroofing and scratch marks and scabbing  various areas, forearms, back of hands, foreskin of the penis, trunk and groins and thighs.   HEAD and Neck: without JVD, Head and scalp: normal Eyes:No scleral icterus. Fundi normal, eye movements normal. Ears: Auricle normal,  canal normal, Tympanic membranes normal, insufflation normal. Nose: normal Throat: normal Neck & thyroid: normal  CHEST & LUNGS: Chest wall: normal Lungs: Clear  CVS: Reveals  the PMI to be normally located. Regular rhythm, First and Second Heart sounds are normal,  absence of murmurs, rubs or gallops. Peripheral vasculature: Radial pulses: normal Dorsal pedis pulses: normal Posterior pulses: normal  ABDOMEN:  Appearance: normal Benign, no organomegaly, no masses, no Abdominal Aortic enlargement. No Guarding , no rebound. No Bruits. Bowel sounds: normal  RECTAL: N/A GU: N/A  EXTREMETIES: nonedematous.  NEUROLOGIC: oriented to time,place and person; nonfocal.  ASSESSMENT: Scabies - Plan: permethrin (ELIMITE) 5 % cream, predniSONE (DELTASONE) 20 MG tablet  HYPERTENSION, BENIGN  HYPERLIPIDEMIA  CAROTID ARTERY STENOSIS, WITHOUT INFARCTION  CAD, ARTERY BYPASS GRAFT  Rash and nonspecific skin eruption - Plan: permethrin (ELIMITE) 5 % cream, predniSONE (DELTASONE) 20 MG tablet  PLAN: Handout on scabies given. Discussed use of medication and treatment at home and the washing of bedding after treatment. Enough given to treat the wife as well. No orders of the defined types were placed in this encounter.   Meds ordered this encounter  Medications  . permethrin (ELIMITE) 5 % cream    Sig: Apply 1 application topically once. overnight    Dispense:  120 g    Refill:  1  . predniSONE (DELTASONE) 20 MG tablet    Sig: Take 2 tablets (40 mg total) by mouth daily with breakfast. For 3 days, then 1 tab daily for 2 days, the 1/2 tab daily for 2 days.    Dispense:  9 tablet    Refill:  0   There are no discontinued medications. Return if symptoms worsen or fail to improve.  Simren Popson P. Jacelyn Grip, M.D.

## 2013-10-28 NOTE — Patient Instructions (Signed)
Scabies  Scabies are small bugs (mites) that burrow under the skin and cause red bumps and severe itching. These bugs can only be seen with a microscope. Scabies are highly contagious. They can spread easily from person to person by direct contact. They are also spread through sharing clothing or linens that have the scabies mites living in them. It is not unusual for an entire family to become infected through shared towels, clothing, or bedding.   HOME CARE INSTRUCTIONS   · Your caregiver may prescribe a cream or lotion to kill the mites. If cream is prescribed, massage the cream into the entire body from the neck to the bottom of both feet. Also massage the cream into the scalp and face if your child is less than 1 year old. Avoid the eyes and mouth. Do not wash your hands after application.  · Leave the cream on for 8 to 12 hours. Your child should bathe or shower after the 8 to 12 hour application period. Sometimes it is helpful to apply the cream to your child right before bedtime.  · One treatment is usually effective and will eliminate approximately 95% of infestations. For severe cases, your caregiver may decide to repeat the treatment in 1 week. Everyone in your household should be treated with one application of the cream.  · New rashes or burrows should not appear within 24 to 48 hours after successful treatment. However, the itching and rash may last for 2 to 4 weeks after successful treatment. Your caregiver may prescribe a medicine to help with the itching or to help the rash go away more quickly.  · Scabies can live on clothing or linens for up to 3 days. All of your child's recently used clothing, towels, stuffed toys, and bed linens should be washed in hot water and then dried in a dryer for at least 20 minutes on high heat. Items that cannot be washed should be enclosed in a plastic bag for at least 3 days.  · To help relieve itching, bathe your child in a cool bath or apply cool washcloths to the  affected areas.  · Your child may return to school after treatment with the prescribed cream.  SEEK MEDICAL CARE IF:   · The itching persists longer than 4 weeks after treatment.  · The rash spreads or becomes infected. Signs of infection include red blisters or yellow-tan crust.  Document Released: 09/22/2005 Document Revised: 12/15/2011 Document Reviewed: 01/31/2009  [employer]® Patient Information ©2014 [employer].

## 2013-11-13 ENCOUNTER — Other Ambulatory Visit: Payer: Self-pay | Admitting: Cardiovascular Disease

## 2013-11-14 ENCOUNTER — Telehealth: Payer: Self-pay | Admitting: Family Medicine

## 2013-11-14 ENCOUNTER — Other Ambulatory Visit: Payer: Self-pay | Admitting: Family Medicine

## 2013-11-14 DIAGNOSIS — G894 Chronic pain syndrome: Secondary | ICD-10-CM

## 2013-11-14 MED ORDER — HYDROCODONE-ACETAMINOPHEN 10-325 MG PO TABS: ORAL_TABLET | ORAL | Status: DC

## 2013-11-14 NOTE — Telephone Encounter (Signed)
Patient aware.

## 2013-11-14 NOTE — Telephone Encounter (Signed)
Rx ready for pick up. 

## 2013-11-17 ENCOUNTER — Other Ambulatory Visit: Payer: Self-pay | Admitting: Cardiovascular Disease

## 2013-11-23 ENCOUNTER — Other Ambulatory Visit (HOSPITAL_COMMUNITY): Payer: Self-pay | Admitting: *Deleted

## 2013-11-23 DIAGNOSIS — I6529 Occlusion and stenosis of unspecified carotid artery: Secondary | ICD-10-CM

## 2013-12-05 ENCOUNTER — Ambulatory Visit (HOSPITAL_COMMUNITY): Payer: Medicare Other | Attending: Cardiovascular Disease

## 2013-12-05 DIAGNOSIS — I2510 Atherosclerotic heart disease of native coronary artery without angina pectoris: Secondary | ICD-10-CM | POA: Insufficient documentation

## 2013-12-05 DIAGNOSIS — I6529 Occlusion and stenosis of unspecified carotid artery: Secondary | ICD-10-CM | POA: Diagnosis not present

## 2013-12-09 ENCOUNTER — Encounter: Payer: Self-pay | Admitting: Cardiovascular Disease

## 2013-12-09 ENCOUNTER — Ambulatory Visit (INDEPENDENT_AMBULATORY_CARE_PROVIDER_SITE_OTHER): Payer: Medicare Other | Admitting: Cardiovascular Disease

## 2013-12-09 ENCOUNTER — Encounter: Payer: Self-pay | Admitting: Cardiology

## 2013-12-09 VITALS — BP 156/63 | HR 71 | Ht 65.0 in | Wt 156.4 lb

## 2013-12-09 DIAGNOSIS — I219 Acute myocardial infarction, unspecified: Secondary | ICD-10-CM | POA: Diagnosis not present

## 2013-12-09 DIAGNOSIS — I25810 Atherosclerosis of coronary artery bypass graft(s) without angina pectoris: Secondary | ICD-10-CM

## 2013-12-09 DIAGNOSIS — I6529 Occlusion and stenosis of unspecified carotid artery: Secondary | ICD-10-CM | POA: Diagnosis not present

## 2013-12-09 DIAGNOSIS — Z01812 Encounter for preprocedural laboratory examination: Secondary | ICD-10-CM

## 2013-12-09 LAB — BASIC METABOLIC PANEL
BUN: 11 mg/dL (ref 6–23)
CALCIUM: 9.1 mg/dL (ref 8.4–10.5)
CO2: 28 mEq/L (ref 19–32)
CREATININE: 1 mg/dL (ref 0.4–1.5)
Chloride: 107 mEq/L (ref 96–112)
GFR: 77.25 mL/min (ref >60.00)
Glucose, Bld: 100 mg/dL — ABNORMAL HIGH (ref 70–99)
Potassium: 3.5 mEq/L (ref 3.5–5.1)
SODIUM: 141 meq/L (ref 135–145)

## 2013-12-09 LAB — CBC WITH DIFFERENTIAL/PLATELET
Basophils Absolute: 0 10*3/uL (ref 0.0–0.1)
Basophils Relative: 0.3 % (ref 0.0–3.0)
EOS ABS: 0.2 10*3/uL (ref 0.0–0.7)
Eosinophils Relative: 3.2 % (ref 0.0–5.0)
HCT: 36.8 % — ABNORMAL LOW (ref 39.0–52.0)
Hemoglobin: 12.5 g/dL — ABNORMAL LOW (ref 13.0–17.0)
LYMPHS PCT: 12.9 % (ref 12.0–46.0)
Lymphs Abs: 0.7 10*3/uL (ref 0.7–4.0)
MCHC: 34.1 g/dL (ref 30.0–36.0)
MCV: 88.1 fl (ref 78.0–100.0)
MONO ABS: 0.4 10*3/uL (ref 0.1–1.0)
Monocytes Relative: 7.1 % (ref 3.0–12.0)
NEUTROS PCT: 76.5 % (ref 43.0–77.0)
Neutro Abs: 4 10*3/uL (ref 1.4–7.7)
Platelets: 125 10*3/uL — ABNORMAL LOW (ref 150.0–400.0)
RBC: 4.17 Mil/uL — ABNORMAL LOW (ref 4.22–5.81)
RDW: 13.4 % (ref 11.5–14.6)
WBC: 5.2 10*3/uL (ref 4.5–10.5)

## 2013-12-09 LAB — PROTIME-INR
INR: 1.1 ratio — AB (ref 0.8–1.0)
Prothrombin Time: 11.7 s (ref 10.2–12.4)

## 2013-12-09 MED ORDER — ASPIRIN EC 81 MG PO TBEC: 81.0000 mg | DELAYED_RELEASE_TABLET | Freq: Every day | ORAL | Status: AC

## 2013-12-09 NOTE — Patient Instructions (Signed)
Your physician has recommended you make the following change in your medication:   1. Decrease Aspirin to 81 mg once daily.  Your physician recommends that you have labs today: BMET, CBC, INR  Your physician has requested that you have a Left Heart cardiac catheterization. Cardiac catheterization is used to diagnose and/or treat various heart conditions. Doctors may recommend this procedure for a number of different reasons. The most common reason is to evaluate chest pain. Chest pain can be a symptom of coronary artery disease (CAD), and cardiac catheterization can show whether plaque is narrowing or blocking your heart's arteries. This procedure is also used to evaluate the valves, as well as measure the blood flow and oxygen levels in different parts of your heart. For further information please visit [email or web address]. Please follow instruction sheet, as given.  Your physician wants you to follow-up in: 6 months with Dr. Burt Knack. You will receive a reminder letter in the mail two months in advance. If you don't receive a letter, please call our office to schedule the follow-up appointment.

## 2013-12-09 NOTE — Progress Notes (Signed)
HPI:  68 year old gentleman presenting for followup evaluation. The patient has been followed by Dr. wall. He has a history of coronary artery disease status post CABG, carotid stenosis status post right carotid endarterectomy, hypertension, and hyperlipidemia. The patient also has a history of lung cancer. He had a recent carotid duplex scan 12/07/2013. This demonstrated chronic occlusion of the left internal carotid artery. He has 60-79% stenosis on the right systolic velocities greater than 017 the diastolic velocities well below 100.  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 He underwent PCI of a totally occluded vein graft to the obtuse marginal. He then underwent staged PCI of another saphenous vein graft a few days later. He's had no recurrent ischemic event since that time.  The patient has had more chest pain of late. He describes chest pressure when he walks on a treadmill. He came in for a carotid duplex scan 2 days ago when he got back to the car he had about 30 minutes of discomfort in both shoulders. This was self-limited. He has mild dyspnea with exertion but this has been a problem chronic problem. No orthopnea, PND, or leg swelling. No palpitations or lightheadedness. He is compliant with his medications.    Outpatient Encounter Prescriptions as of 12/09/2013  Medication Sig  . ALPRAZolam (XANAX) 1 MG tablet Take 1 tablet (1 mg total) by mouth 3 (three) times daily as needed for anxiety.  Marland Kitchen amLODipine (NORVASC) 10 MG tablet TAKE 1 TABLET EVERY DAY  . aspirin 325 MG tablet Take 325 mg by mouth daily.    Marland Kitchen EFFIENT 10 MG TABS tablet TAKE 1 TABLET EVERY DAY  . fenofibrate 160 MG tablet TAKE 1 TABLET EVERY DAY  . gabapentin (NEURONTIN) 300 MG capsule TAKE 1 CAPSULE BY MOUTH EVERY EIGHT HOURS  . hydrochlorothiazide (MICROZIDE) 12.5 MG capsule TAKE 1 CAPSULE EVERY DAY  . HYDROcodone-acetaminophen (NORCO) 10-325  MG per tablet TAKE 1 TABLET BY MOUTH EVERY SIX HOURS AS NEEDED FOR PAIN  . KLOR-CON M20 20 MEQ tablet TAKE 1 TABLET BY MOUTH DAILY  . metoprolol (LOPRESSOR) 50 MG tablet TAKE 1 TABLET (50 MG TOTAL) BY MOUTH 2 (TWO) TIMES DAILY.  . Multiple Vitamin (MULTIVITAMIN) capsule Take 1 capsule by mouth daily.    . nitroGLYCERIN (NITROSTAT) 0.4 MG SL tablet Place 0.4 mg under the tongue every 5 (five) minutes as needed. After 3 doses and no relief call 911  . rosuvastatin (CRESTOR) 20 MG tablet Take 1 tablet (20 mg total) by mouth daily.  . [DISCONTINUED] permethrin (ELIMITE) 5 % cream Apply 1 application topically once. overnight  . [DISCONTINUED] predniSONE (DELTASONE) 20 MG tablet Take 2 tablets (40 mg total) by mouth daily with breakfast. For 3 days, then 1 tab daily for 2 days, the 1/2 tab daily for 2 days.    Allergies  Allergen Reactions  . Demerol Nausea And Vomiting  . Clopidogrel Bisulfate     Severe rash  . Meperidine Hcl   . Penicillins     Rash   . Propoxyphene N-Acetaminophen     nauesa    Past Medical History  Diagnosis Date  . Lung cancer     lung ca dx 1/10  . Hypertension   . Hyperlipidemia   . CAD (coronary artery disease)     Dr. Verl Blalock  - Cardiologist   . Dementia   . PVD (peripheral vascular disease)   . ASCVD (arteriosclerotic cardiovascular  disease)   . Anxiety     Dr. Corrinne Eagle    . OA (osteoarthritis)   . Neuropathic pain   . History of radiation therapy 11/15/08-02/23/09    RUL 66Gy/9fx    ROS: Negative except as per HPI  BP 156/63  Pulse 71  Ht 5\' 5"  (1.651 m)  Wt 156 lb 6.4 oz (70.943 kg)  BMI 26.03 kg/m2  PHYSICAL EXAM: Pt is alert and oriented, NAD HEENT: normal Neck: JVP - normal, carotids 2+= with bilateral bruits Lungs: CTA bilaterally CV: RRR without murmur or gallop Abd: soft, NT, Positive BS, no hepatomegaly Ext: no C/C/E, distal pulses intact and equal Skin: warm/dry no rash  EKG:  Sinus rhythm 72 beats per minute, ST and T wave  abnormality consider inferolateral ischemia.  ASSESSMENT AND PLAN: 1. Coronary artery disease, native vessel and bypass graft disease, with history of old MI. He now describes progressive angina, CCS class III. He's been maintained on long-term dual antiplatelet therapy and is also on good antianginal regimen a combination of amlodipine and metoprolol. Considering his progressive symptoms and known extensive bypass graft disease, I have recommended cardiac catheterization and possible PCI. I have reviewed the risks, indications, and alternatives to catheterization and PCI with the patient and his wife. These risks include but are not limited to arrhythmia, vascular injury, bleeding, stroke, myocardial infarction, emergency surgery, and death. I advised that he decrease his aspirin 81 mg daily. He will continue his other medications without changes.  2. Hypertension. Continue amlodipine, hydrochlorothiazide, metoprolol. May need upward titration of his antihypertensives. We'll reassess next week.  3. Hyperlipidemia. Followed by his primary care physician. He takes rosuvastatin 20 mg daily.  4. Lung cancer. This is been stable over time. He is followed yearly and has upcoming imaging and radiation oncology visits in October of this year.  [name] 12/09/2013 12:24 PM

## 2013-12-13 ENCOUNTER — Ambulatory Visit (INDEPENDENT_AMBULATORY_CARE_PROVIDER_SITE_OTHER): Payer: Medicare Other | Admitting: Family Medicine

## 2013-12-13 ENCOUNTER — Encounter: Payer: Self-pay | Admitting: Family Medicine

## 2013-12-13 VITALS — BP 102/53 | HR 55 | Temp 97.9°F | Ht 65.0 in | Wt 153.4 lb

## 2013-12-13 DIAGNOSIS — I25810 Atherosclerosis of coronary artery bypass graft(s) without angina pectoris: Secondary | ICD-10-CM

## 2013-12-13 DIAGNOSIS — F411 Generalized anxiety disorder: Secondary | ICD-10-CM

## 2013-12-13 DIAGNOSIS — R222 Localized swelling, mass and lump, trunk: Secondary | ICD-10-CM

## 2013-12-13 DIAGNOSIS — D696 Thrombocytopenia, unspecified: Secondary | ICD-10-CM | POA: Diagnosis not present

## 2013-12-13 DIAGNOSIS — E785 Hyperlipidemia, unspecified: Secondary | ICD-10-CM

## 2013-12-13 DIAGNOSIS — I10 Essential (primary) hypertension: Secondary | ICD-10-CM | POA: Diagnosis not present

## 2013-12-13 DIAGNOSIS — I6529 Occlusion and stenosis of unspecified carotid artery: Secondary | ICD-10-CM

## 2013-12-13 DIAGNOSIS — J309 Allergic rhinitis, unspecified: Secondary | ICD-10-CM

## 2013-12-13 DIAGNOSIS — R21 Rash and other nonspecific skin eruption: Secondary | ICD-10-CM

## 2013-12-13 DIAGNOSIS — G894 Chronic pain syndrome: Secondary | ICD-10-CM

## 2013-12-13 DIAGNOSIS — B86 Scabies: Secondary | ICD-10-CM

## 2013-12-13 MED ORDER — HYDROCODONE-ACETAMINOPHEN 10-325 MG PO TABS: ORAL_TABLET | ORAL | Status: DC

## 2013-12-13 MED ORDER — ALPRAZOLAM 1 MG PO TABS: 1.0000 mg | ORAL_TABLET | Freq: Three times a day (TID) | ORAL | Status: DC | PRN

## 2013-12-13 MED ORDER — GABAPENTIN 300 MG PO CAPS: ORAL_CAPSULE | ORAL | Status: DC

## 2013-12-13 MED ORDER — PERMETHRIN 5 % EX CREA: TOPICAL_CREAM | Freq: Once | CUTANEOUS | Status: DC

## 2013-12-13 MED ORDER — TRIAMCINOLONE ACETONIDE 0.025 % EX CREA: 1.0000 "application " | TOPICAL_CREAM | Freq: Two times a day (BID) | CUTANEOUS | Status: DC

## 2013-12-13 NOTE — Progress Notes (Signed)
Patient ID: [name], male   DOB: 04-17-1946, 68 y.o.   MRN: [id number] SUBJECTIVE: CC: Chief Complaint  Patient presents with  . Follow-up    3 MONTH FOLLOW UP .  HAVING HEART PROCEDURE ON FRIDAY WITH DR Burt Knack    HPI:  Patient is here for follow up of hyperlipidemia/HTN/CAD/: denies Headache;denies Chest Pain;denies weakness;denies Shortness of Breath and orthopnea;denies Visual changes;denies palpitations;denies cough;denies pedal edema;denies symptoms of TIA or stroke;deniesClaudication symptoms. admits to Compliance with medications; denies Problems with medications.  Breakfast: Dry bacon sandwich Lunch: vegetable soup Dinner: liver pudding sandwich.  The rash in the groins and trunk is resolved. The arms got better and it came back. Also, came back on his legs. Intensely itching.  Past Medical History  Diagnosis Date  . Hypertension   . Hyperlipidemia   . CAD (coronary artery disease)     Dr. Verl Blalock  - Cardiologist   . Dementia   . PVD (peripheral vascular disease)   . ASCVD (arteriosclerotic cardiovascular disease)   . Anxiety     Dr. Corrinne Eagle    . OA (osteoarthritis)   . Neuropathic pain   . History of radiation therapy 11/15/08-02/23/09    RUL 66Gy/68fx  . Lung cancer     lung ca dx 1/10   Past Surgical History  Procedure Laterality Date  . Coronary artery bypass graft  7/95    x5 ([facility] )  . Carotid endarterectomy  12/98    right ([facility])  . Cardiac catheterization  12/20/04    recurrent    History   Social History  . Marital Status: Married    Spouse Name: N/A    Number of Children: N/A  . Years of Education: N/A   Occupational History  . Not on file.   Social History Main Topics  . Smoking status: Former Research scientist (life sciences)  . Smokeless tobacco: Not on file  . Alcohol Use: No  . Drug Use: Not on file  . Sexual Activity: Not on file   Other Topics Concern  . Not on file   Social History Narrative  . No narrative on file   Family History  Problem  Relation Age of Onset  . Heart attack Mother   . Heart disease Father   . Heart disease Sister   . Heart disease Brother   . Coronary artery disease Other    Current Outpatient Prescriptions on File Prior to Visit  Medication Sig Dispense Refill  . amLODipine (NORVASC) 10 MG tablet TAKE 1 TABLET EVERY DAY  30 tablet  0  . aspirin EC 81 MG tablet Take 1 tablet (81 mg total) by mouth daily.  30 tablet  4  . EFFIENT 10 MG TABS tablet TAKE 1 TABLET EVERY DAY  30 tablet  2  . fenofibrate 160 MG tablet TAKE 1 TABLET EVERY DAY  30 tablet  11  . hydrochlorothiazide (MICROZIDE) 12.5 MG capsule TAKE 1 CAPSULE EVERY DAY  30 capsule  2  . KLOR-CON M20 20 MEQ tablet TAKE 1 TABLET BY MOUTH DAILY  30 tablet  0  . metoprolol (LOPRESSOR) 50 MG tablet TAKE 1 TABLET (50 MG TOTAL) BY MOUTH 2 (TWO) TIMES DAILY.  180 tablet  0  . Multiple Vitamin (MULTIVITAMIN) capsule Take 1 capsule by mouth daily.        . nitroGLYCERIN (NITROSTAT) 0.4 MG SL tablet Place 0.4 mg under the tongue every 5 (five) minutes as needed. After 3 doses and no relief call 911      .  rosuvastatin (CRESTOR) 20 MG tablet Take 1 tablet (20 mg total) by mouth daily.  30 tablet  5   No current facility-administered medications on file prior to visit.   Allergies  Allergen Reactions  . Demerol Nausea And Vomiting  . Clopidogrel Bisulfate     Severe rash  . Meperidine Hcl   . Penicillins     Rash   . Propoxyphene N-Acetaminophen     nauesa   Immunization History  Administered Date(s) Administered  . Influenza,inj,Quad PF,36+ Mos 09/23/2013  . Pneumococcal Polysaccharide-23 05/07/2011  . Tdap 08/19/2010  . Zoster 05/06/2012   Prior to Admission medications   Medication Sig Start Date End Date Taking? Authorizing Provider  ALPRAZolam Duanne Moron) 1 MG tablet Take 1 tablet (1 mg total) by mouth 3 (three) times daily as needed for anxiety. 10/17/13  Yes Vernie Shanks, MD  amLODipine (NORVASC) 10 MG tablet TAKE 1 TABLET EVERY DAY 11/13/13   Yes [name], MD  aspirin EC 81 MG tablet Take 1 tablet (81 mg total) by mouth daily. 12/09/13  Yes [name], MD  EFFIENT 10 MG TABS tablet TAKE 1 TABLET EVERY DAY 10/11/13  Yes Vernie Shanks, MD  fenofibrate 160 MG tablet TAKE 1 TABLET EVERY DAY 03/17/13  Yes Vernie Shanks, MD  gabapentin (NEURONTIN) 300 MG capsule TAKE 1 CAPSULE BY MOUTH EVERY EIGHT HOURS 10/11/13  Yes Vernie Shanks, MD  hydrochlorothiazide (MICROZIDE) 12.5 MG capsule TAKE 1 CAPSULE EVERY DAY 09/16/13  Yes [name], MD  HYDROcodone-acetaminophen ([place]) 10-325 MG per tablet TAKE 1 TABLET BY MOUTH EVERY SIX HOURS AS NEEDED FOR PAIN 11/14/13  Yes Vernie Shanks, MD  KLOR-CON M20 20 MEQ tablet TAKE 1 TABLET BY MOUTH DAILY   Yes [name], MD  metoprolol (LOPRESSOR) 50 MG tablet TAKE 1 TABLET (50 MG TOTAL) BY MOUTH 2 (TWO) TIMES DAILY.   Yes [name], MD  Multiple Vitamin (MULTIVITAMIN) capsule Take 1 capsule by mouth daily.     Yes Historical Provider, MD  nitroGLYCERIN (NITROSTAT) 0.4 MG SL tablet Place 0.4 mg under the tongue every 5 (five) minutes as needed. After 3 doses and no relief call 911 06/09/12  Yes Renella Cunas, MD  rosuvastatin (CRESTOR) 20 MG tablet Take 1 tablet (20 mg total) by mouth daily. 03/31/13  Yes Vernie Shanks, MD     ROS: As above in the HPI. All other systems are stable or negative.  OBJECTIVE: APPEARANCE:  Patient in no acute distress.The patient appeared well nourished and normally developed. Acyanotic. Waist: VITAL SIGNS:BP 102/53  Pulse 55  Temp(Src) 97.9 F (36.6 C) (Oral)  Ht 5\' 5"  (1.651 m)  Wt 153 lb 6.4 oz (69.582 kg)  BMI 25.53 kg/m2  WM  SKIN: warm and  Dry withou tattoos and scars. The linear rash is on the left forearms and the left lateral leg. Some excoriation.  HEAD and Neck: without JVD, Head and scalp: normal Eyes:No scleral icterus. Fundi normal, eye movements normal. Ears: Auricle normal, canal normal, Tympanic membranes normal, insufflation  normal. Nose: normal Throat: normal  thyroid: normal Neck: Bilateral Carotid Bruits. 2+  CHEST & LUNGS: Chest wall: normal Lungs: Clear  CVS: Reveals the PMI to be normally located. Regular rhythm, First and Second Heart sounds are normal,  absence of murmurs, rubs or gallops. Peripheral vasculature: Radial pulses: normal Dorsal pedis pulses: normal Posterior pulses: normal  ABDOMEN:  Appearance: normal Benign, no organomegaly, no masses, no Abdominal Aortic enlargement. No Guarding ,  no rebound. No Bruits. Bowel sounds: normal  RECTAL: N/A GU: N/A  EXTREMETIES: nonedematous.  MUSCULOSKELETAL:  Spine: normal Joints: intact  NEUROLOGIC: oriented to time,place and person; nonfocal. Strength is normal Sensory is normal Reflexes are normal Cranial Nerves are normal.  ASSESSMENT: THROMBOCYTOPENIA  Swelling, mass, or lump in chest  HYPERTENSION, BENIGN  HYPERLIPIDEMIA - Plan: NMR, lipoprofile, Hepatic function panel  Chronic pain syndrome - Plan: HYDROcodone-acetaminophen (NORCO) 10-325 MG per tablet  CAROTID ARTERY STENOSIS, WITHOUT INFARCTION  Anxiety state, unspecified - Plan: ALPRAZolam (XANAX) 1 MG tablet  ALLERGIC RHINITIS  CAD, ARTERY BYPASS GRAFT  Rash and nonspecific skin eruption  Scabies  PLAN: Discussed a plant based  Diet to improve his cardiovascular disease status. Reviewed his labs. Results for orders placed in visit on 12/09/13  CBC WITH DIFFERENTIAL      Result Value Ref Range   WBC 5.2  4.5 - 10.5 K/uL   RBC 4.17 (*) 4.22 - 5.81 Mil/uL   Hemoglobin 12.5 (*) 13.0 - 17.0 g/dL   HCT 36.8 (*) 39.0 - 52.0 %   MCV 88.1  78.0 - 100.0 fl   MCHC 34.1  30.0 - 36.0 g/dL   RDW 13.4  11.5 - 14.6 %   Platelets 125.0 (*) 150.0 - 400.0 K/uL   Neutrophils Relative % 76.5  43.0 - 77.0 %   Lymphocytes Relative 12.9  12.0 - 46.0 %   Monocytes Relative 7.1  3.0 - 12.0 %   Eosinophils Relative 3.2  0.0 - 5.0 %   Basophils Relative 0.3  0.0 - 3.0  %   Neutro Abs 4.0  1.4 - 7.7 K/uL   Lymphs Abs 0.7  0.7 - 4.0 K/uL   Monocytes Absolute 0.4  0.1 - 1.0 K/uL   Eosinophils Absolute 0.2  0.0 - 0.7 K/uL   Basophils Absolute 0.0  0.0 - 0.1 K/uL  PROTIME-INR      Result Value Ref Range   Prothrombin Time 11.7  10.2 - 12.4 sec   INR 1.1 (*) 0.8 - 1.0 ratio  BASIC METABOLIC PANEL      Result Value Ref Range   Sodium 141  135 - 145 mEq/L   Potassium 3.5  3.5 - 5.1 mEq/L   Chloride 107  96 - 112 mEq/L   CO2 28  19 - 32 mEq/L   Glucose, Bld 100 (*) 70 - 99 mg/dL   BUN 11  6 - 23 mg/dL   Creatinine, Ser 1.0  0.4 - 1.5 mg/dL   Calcium 9.1  8.4 - 10.5 mg/dL   GFR 77.25  >60.00 mL/min    Orders Placed This Encounter  Procedures  . NMR, lipoprofile  . Hepatic function panel   Meds ordered this encounter  Medications  . DISCONTD: permethrin (ELIMITE) 5 % cream    Sig:   . DISCONTD: predniSONE (DELTASONE) 20 MG tablet    Sig:   . permethrin (ELIMITE) 5 % cream    Sig: Apply topically once.    Dispense:  60 g    Refill:  1  . triamcinolone (KENALOG) 0.025 % cream    Sig: Apply 1 application topically 2 (two) times daily.    Dispense:  45 g    Refill:  0  . gabapentin (NEURONTIN) 300 MG capsule    Sig: TAKE 1 CAPSULE BY MOUTH EVERY EIGHT HOURS    Dispense:  90 capsule    Refill:  2  . ALPRAZolam (XANAX) 1 MG tablet    Sig:  Take 1 tablet (1 mg total) by mouth 3 (three) times daily as needed for anxiety.    Dispense:  90 tablet    Refill:  1    Order Specific Question:  Supervising Provider    Answer:  Chipper Herb [[id number]]  . HYDROcodone-acetaminophen (NORCO) 10-325 MG per tablet    Sig: TAKE 1 TABLET BY MOUTH EVERY SIX HOURS AS NEEDED FOR PAIN    Dispense:  120 tablet    Refill:  0    Order Specific Question:  Supervising Provider    Answer:  Joycelyn Man   Medications Discontinued During This Encounter  Medication Reason  . permethrin (ELIMITE) 5 % cream Completed Course  . predniSONE (DELTASONE) 20 MG tablet  Completed Course  . gabapentin (NEURONTIN) 300 MG capsule Reorder  . ALPRAZolam (XANAX) 1 MG tablet Reorder  . HYDROcodone-acetaminophen (NORCO) 10-325 MG per tablet Reorder  keep appointment for his cardiac  Catheterization. Wished him well.  Return in about 3 months (around 03/15/2014) for Recheck medical problems.  Leonidas Boateng P. Jacelyn Grip, M.D.

## 2013-12-15 ENCOUNTER — Other Ambulatory Visit: Payer: Self-pay | Admitting: Cardiovascular Disease

## 2013-12-15 LAB — HEPATIC FUNCTION PANEL
ALT: 17 IU/L (ref 0–44)
AST: 15 IU/L (ref 0–40)
Albumin: 4.4 g/dL (ref 3.6–4.8)
Alkaline Phosphatase: 48 IU/L (ref 39–117)
Bilirubin, Direct: 0.13 mg/dL (ref 0.00–0.40)
Total Bilirubin: 0.4 mg/dL (ref 0.0–1.2)
Total Protein: 6.4 g/dL (ref 6.0–8.5)

## 2013-12-15 LAB — NMR, LIPOPROFILE
Cholesterol: 143 mg/dL (ref <200)
HDL Cholesterol by NMR: 36 mg/dL — ABNORMAL LOW (ref >=40)
HDL Particle Number: 33 umol/L (ref >=30.5)
LDL Particle Number: 1288 nmol/L — ABNORMAL HIGH (ref <1000)
LDL Size: 20.9 nm (ref >20.5)
LDLC SERPL CALC-MCNC: 64 mg/dL (ref <100)
LP-IR Score: 76 — ABNORMAL HIGH (ref <=45)
Small LDL Particle Number: 755 nmol/L — ABNORMAL HIGH (ref <=527)
Triglycerides by NMR: 217 mg/dL — ABNORMAL HIGH (ref <150)

## 2013-12-16 ENCOUNTER — Ambulatory Visit (HOSPITAL_COMMUNITY)
Admission: RE | Admit: 2013-12-16 | Discharge: 2013-12-16 | Disposition: A | Discharge status: Home / Self Care | Payer: Medicare Other | Source: Ambulatory Visit | Attending: Cardiovascular Disease | Admitting: Cardiovascular Disease

## 2013-12-16 ENCOUNTER — Encounter (HOSPITAL_COMMUNITY)
Admission: RE | Disposition: A | Discharge status: Home / Self Care | Payer: Self-pay | Source: Ambulatory Visit | Attending: Cardiovascular Disease

## 2013-12-16 ENCOUNTER — Ambulatory Visit: Payer: Medicare Other | Admitting: Family Medicine

## 2013-12-16 DIAGNOSIS — IMO0002 Reserved for concepts with insufficient information to code with codable children: Secondary | ICD-10-CM | POA: Insufficient documentation

## 2013-12-16 DIAGNOSIS — E785 Hyperlipidemia, unspecified: Secondary | ICD-10-CM | POA: Diagnosis not present

## 2013-12-16 DIAGNOSIS — Z923 Personal history of irradiation: Secondary | ICD-10-CM | POA: Diagnosis not present

## 2013-12-16 DIAGNOSIS — I739 Peripheral vascular disease, unspecified: Secondary | ICD-10-CM | POA: Insufficient documentation

## 2013-12-16 DIAGNOSIS — I25810 Atherosclerosis of coronary artery bypass graft(s) without angina pectoris: Secondary | ICD-10-CM | POA: Diagnosis not present

## 2013-12-16 DIAGNOSIS — I2582 Chronic total occlusion of coronary artery: Secondary | ICD-10-CM | POA: Diagnosis not present

## 2013-12-16 DIAGNOSIS — F411 Generalized anxiety disorder: Secondary | ICD-10-CM | POA: Diagnosis not present

## 2013-12-16 DIAGNOSIS — I10 Essential (primary) hypertension: Secondary | ICD-10-CM | POA: Diagnosis not present

## 2013-12-16 DIAGNOSIS — Z7901 Long term (current) use of anticoagulants: Secondary | ICD-10-CM | POA: Insufficient documentation

## 2013-12-16 DIAGNOSIS — M1990 Unspecified osteoarthritis, unspecified site: Secondary | ICD-10-CM | POA: Insufficient documentation

## 2013-12-16 DIAGNOSIS — Z9861 Coronary angioplasty status: Secondary | ICD-10-CM | POA: Diagnosis not present

## 2013-12-16 DIAGNOSIS — F0390 Unspecified dementia without behavioral disturbance: Secondary | ICD-10-CM | POA: Insufficient documentation

## 2013-12-16 DIAGNOSIS — Z85118 Personal history of other malignant neoplasm of bronchus and lung: Secondary | ICD-10-CM | POA: Diagnosis not present

## 2013-12-16 DIAGNOSIS — I2584 Coronary atherosclerosis due to calcified coronary lesion: Secondary | ICD-10-CM | POA: Diagnosis not present

## 2013-12-16 DIAGNOSIS — Z7982 Long term (current) use of aspirin: Secondary | ICD-10-CM | POA: Diagnosis not present

## 2013-12-16 DIAGNOSIS — I252 Old myocardial infarction: Secondary | ICD-10-CM | POA: Insufficient documentation

## 2013-12-16 DIAGNOSIS — I2510 Atherosclerotic heart disease of native coronary artery without angina pectoris: Secondary | ICD-10-CM | POA: Insufficient documentation

## 2013-12-16 HISTORY — PX: LEFT HEART CATHETERIZATION WITH CORONARY ANGIOGRAM: SHX5451

## 2013-12-16 SURGERY — LEFT HEART CATHETERIZATION WITH CORONARY ANGIOGRAM
Anesthesia: LOCAL

## 2013-12-16 MED ORDER — HEPARIN SODIUM (PORCINE) 1000 UNIT/ML IJ SOLN
INTRAMUSCULAR | Status: AC
  Filled 2013-12-16: qty 1

## 2013-12-16 MED ORDER — ONDANSETRON HCL 4 MG/2ML IJ SOLN: 4.0000 mg | Freq: Four times a day (QID) | INTRAMUSCULAR | Status: DC | PRN

## 2013-12-16 MED ORDER — ASPIRIN 81 MG PO CHEW
81.0000 mg | CHEWABLE_TABLET | ORAL | Status: AC
  Administered 2013-12-16: 81 mg via ORAL
  Filled 2013-12-16: qty 1

## 2013-12-16 MED ORDER — SODIUM CHLORIDE 0.9 % IJ SOLN
3.0000 mL | Freq: Two times a day (BID) | INTRAMUSCULAR | Status: DC
Start: 2013-12-16 — End: 2013-12-16

## 2013-12-16 MED ORDER — FENTANYL CITRATE 0.05 MG/ML IJ SOLN
INTRAMUSCULAR | Status: AC
  Filled 2013-12-16: qty 2

## 2013-12-16 MED ORDER — SODIUM CHLORIDE 0.9 % IV SOLN
INTRAVENOUS | Status: DC
  Administered 2013-12-16: 07:00:00 via INTRAVENOUS

## 2013-12-16 MED ORDER — MIDAZOLAM HCL 2 MG/2ML IJ SOLN
INTRAMUSCULAR | Status: AC
  Filled 2013-12-16: qty 2

## 2013-12-16 MED ORDER — VERAPAMIL HCL 2.5 MG/ML IV SOLN
INTRAVENOUS | Status: AC
  Filled 2013-12-16: qty 2

## 2013-12-16 MED ORDER — ACETAMINOPHEN 325 MG PO TABS: 650.0000 mg | ORAL_TABLET | ORAL | Status: DC | PRN

## 2013-12-16 MED ORDER — NITROGLYCERIN 0.2 MG/ML ON CALL CATH LAB
INTRAVENOUS | Status: AC
  Filled 2013-12-16: qty 1

## 2013-12-16 MED ORDER — SODIUM CHLORIDE 0.9 % IV SOLN: 250.0000 mL | INTRAVENOUS | Status: DC | PRN

## 2013-12-16 MED ORDER — SODIUM CHLORIDE 0.9 % IV SOLN: 1.0000 mL/kg/h | INTRAVENOUS | Status: DC

## 2013-12-16 MED ORDER — LIDOCAINE HCL (PF) 1 % IJ SOLN
INTRAMUSCULAR | Status: AC
  Filled 2013-12-16: qty 30

## 2013-12-16 MED ORDER — SODIUM CHLORIDE 0.9 % IJ SOLN: 3.0000 mL | INTRAMUSCULAR | Status: DC | PRN

## 2013-12-16 MED ORDER — HEPARIN (PORCINE) IN NACL 2-0.9 UNIT/ML-% IJ SOLN
INTRAMUSCULAR | Status: AC
  Filled 2013-12-16: qty 1000

## 2013-12-16 NOTE — Discharge Instructions (Signed)

## 2013-12-16 NOTE — CV Procedure (Signed)
    Cardiac Catheterization Procedure Note  Name: [name] MRN: [id number] DOB: 1946-01-13  Procedure: Left Heart Cath, Selective Coronary Angiography, LV angiography, LIMA angiography, SVG angiography  Indication: Crescendo angina   Procedural Details: The left wrist was prepped, draped, and anesthetized with 1% lidocaine. Using the modified Seldinger technique, a 5/6 French sheath was introduced into the left radial artery. 3 mg of verapamil was administered through the sheath, weight-based unfractionated heparin was administered intravenously. Standard Judkins catheters were used for selective coronary angiography and left ventriculography. SVG angiography was performed with an AL-2 catheter and LIMA angiography was performed with a LIMA catheter.Catheter exchanges were performed over an exchange length guidewire. There were no immediate procedural complications. A TR band was used for radial hemostasis at the completion of the procedure.  The patient was transferred to the post catheterization recovery area for further monitoring.  Procedural Findings: Hemodynamics: AO 138/54 LV 132/24  Coronary angiography: Coronary dominance: right  Left mainstem: total occlusion at the ostium  Left anterior descending (LAD): fills from LIMA  Left circumflex (LCx): fills from SVG  Right coronary artery (RCA): small nondominant. Heavily calcified ostium. Patent throughout.  LIMA - LAD: widely patent throughout. Mid/distal LAD are patent. prox LAD fills retrograde from the graft. There is faint diagonal filling of diffusely diseased vessels in retrograde fashion.  SVG - OM1 and OM2: total occlusion at aortic anastomosis  SVG - OM3 and LPL: patent stents in mid-body of graft. Mild 40% stenosis at OM3 insertion site.  LPL and L PDA fill from this graft  Left ventriculography: Mild anterolateral hypokinesis. LVEF estimated at 45%  Final Conclusions:   1. Severe native 2V CAD with total  occlusion of the LM 2. Heavily calcified but patent nondominant RCA 3. S/P CABG with continued patency of the LIMA-LAD and SVG-OM3 and LPL 4. Total occlusion of the SVG-OM1 and OM2 5. Mild segmental LV systolic dysfunction  Recommendations: aggressive medical therapy. I don't think there are any targets for revascularization.  [name] 12/16/2013, 10:40 AM

## 2013-12-16 NOTE — Interval H&P Note (Signed)
History and Physical Interval Note:  12/16/2013 9:56 AM  [name]  has presented today for surgery, with the diagnosis of angina  The various methods of treatment have been discussed with the patient and family. After consideration of risks, benefits and other options for treatment, the patient has consented to  Procedure(s): LEFT HEART CATHETERIZATION WITH CORONARY ANGIOGRAM (N/A) as a surgical intervention .  The patient's history has been reviewed, patient examined, no change in status, stable for surgery.  I have reviewed the patient's chart and labs.  Questions were answered to the patient's satisfaction.    Cath Lab Visit (complete for each Cath Lab visit)  Clinical Evaluation Leading to the Procedure:   ACS: no  Non-ACS:    Anginal Classification: CCS III  Anti-ischemic medical therapy: Maximal Therapy (2 or more classes of medications)  Non-Invasive Test Results: No non-invasive testing performed  Prior CABG: Previous CABG       [name]

## 2013-12-16 NOTE — H&P (View-Only) (Signed)
HPI:  68 year old gentleman presenting for followup evaluation. The patient has been followed by Dr. wall. He has a history of coronary artery disease status post CABG, carotid stenosis status post right carotid endarterectomy, hypertension, and hyperlipidemia. The patient also has a history of lung cancer. He had a recent carotid duplex scan 12/07/2013. This demonstrated chronic occlusion of the left internal carotid artery. He has 60-79% stenosis on the right systolic velocities greater than 093 the diastolic velocities well below 100.  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 He underwent PCI of a totally occluded vein graft to the obtuse marginal. He then underwent staged PCI of another saphenous vein graft a few days later. He's had no recurrent ischemic event since that time.  The patient has had more chest pain of late. He describes chest pressure when he walks on a treadmill. He came in for a carotid duplex scan 2 days ago when he got back to the car he had about 30 minutes of discomfort in both shoulders. This was self-limited. He has mild dyspnea with exertion but this has been a problem chronic problem. No orthopnea, PND, or leg swelling. No palpitations or lightheadedness. He is compliant with his medications.    Outpatient Encounter Prescriptions as of 12/09/2013  Medication Sig  . ALPRAZolam (XANAX) 1 MG tablet Take 1 tablet (1 mg total) by mouth 3 (three) times daily as needed for anxiety.  Marland Kitchen amLODipine (NORVASC) 10 MG tablet TAKE 1 TABLET EVERY DAY  . aspirin 325 MG tablet Take 325 mg by mouth daily.    Marland Kitchen EFFIENT 10 MG TABS tablet TAKE 1 TABLET EVERY DAY  . fenofibrate 160 MG tablet TAKE 1 TABLET EVERY DAY  . gabapentin (NEURONTIN) 300 MG capsule TAKE 1 CAPSULE BY MOUTH EVERY EIGHT HOURS  . hydrochlorothiazide (MICROZIDE) 12.5 MG capsule TAKE 1 CAPSULE EVERY DAY  . HYDROcodone-acetaminophen (NORCO) 10-325  MG per tablet TAKE 1 TABLET BY MOUTH EVERY SIX HOURS AS NEEDED FOR PAIN  . KLOR-CON M20 20 MEQ tablet TAKE 1 TABLET BY MOUTH DAILY  . metoprolol (LOPRESSOR) 50 MG tablet TAKE 1 TABLET (50 MG TOTAL) BY MOUTH 2 (TWO) TIMES DAILY.  . Multiple Vitamin (MULTIVITAMIN) capsule Take 1 capsule by mouth daily.    . nitroGLYCERIN (NITROSTAT) 0.4 MG SL tablet Place 0.4 mg under the tongue every 5 (five) minutes as needed. After 3 doses and no relief call 911  . rosuvastatin (CRESTOR) 20 MG tablet Take 1 tablet (20 mg total) by mouth daily.  . [DISCONTINUED] permethrin (ELIMITE) 5 % cream Apply 1 application topically once. overnight  . [DISCONTINUED] predniSONE (DELTASONE) 20 MG tablet Take 2 tablets (40 mg total) by mouth daily with breakfast. For 3 days, then 1 tab daily for 2 days, the 1/2 tab daily for 2 days.    Allergies  Allergen Reactions  . Demerol Nausea And Vomiting  . Clopidogrel Bisulfate     Severe rash  . Meperidine Hcl   . Penicillins     Rash   . Propoxyphene N-Acetaminophen     nauesa    Past Medical History  Diagnosis Date  . Lung cancer     lung ca dx 1/10  . Hypertension   . Hyperlipidemia   . CAD (coronary artery disease)     Dr. Verl Blalock  - Cardiologist   . Dementia   . PVD (peripheral vascular disease)   . ASCVD (arteriosclerotic cardiovascular  disease)   . Anxiety     Dr. Corrinne Eagle    . OA (osteoarthritis)   . Neuropathic pain   . History of radiation therapy 11/15/08-02/23/09    RUL 66Gy/94fx    ROS: Negative except as per HPI  BP 156/63  Pulse 71  Ht 5\' 5"  (1.651 m)  Wt 156 lb 6.4 oz (70.943 kg)  BMI 26.03 kg/m2  PHYSICAL EXAM: Pt is alert and oriented, NAD HEENT: normal Neck: JVP - normal, carotids 2+= with bilateral bruits Lungs: CTA bilaterally CV: RRR without murmur or gallop Abd: soft, NT, Positive BS, no hepatomegaly Ext: no C/C/E, distal pulses intact and equal Skin: warm/dry no rash  EKG:  Sinus rhythm 72 beats per minute, ST and T wave  abnormality consider inferolateral ischemia.  ASSESSMENT AND PLAN: 1. Coronary artery disease, native vessel and bypass graft disease, with history of old MI. He now describes progressive angina, CCS class III. He's been maintained on long-term dual antiplatelet therapy and is also on good antianginal regimen a combination of amlodipine and metoprolol. Considering his progressive symptoms and known extensive bypass graft disease, I have recommended cardiac catheterization and possible PCI. I have reviewed the risks, indications, and alternatives to catheterization and PCI with the patient and his wife. These risks include but are not limited to arrhythmia, vascular injury, bleeding, stroke, myocardial infarction, emergency surgery, and death. I advised that he decrease his aspirin 81 mg daily. He will continue his other medications without changes.  2. Hypertension. Continue amlodipine, hydrochlorothiazide, metoprolol. May need upward titration of his antihypertensives. We'll reassess next week.  3. Hyperlipidemia. Followed by his primary care physician. He takes rosuvastatin 20 mg daily.  4. Lung cancer. This is been stable over time. He is followed yearly and has upcoming imaging and radiation oncology visits in October of this year.  [name] 12/09/2013 12:24 PM

## 2013-12-18 ENCOUNTER — Other Ambulatory Visit: Payer: Self-pay | Admitting: Family Medicine

## 2013-12-18 MED ORDER — ROSUVASTATIN CALCIUM 40 MG PO TABS: 40.0000 mg | ORAL_TABLET | Freq: Every day | ORAL | Status: DC

## 2014-01-09 ENCOUNTER — Telehealth: Payer: Self-pay | Admitting: Family Medicine

## 2014-01-13 ENCOUNTER — Other Ambulatory Visit: Payer: Self-pay | Admitting: Family Medicine

## 2014-01-13 MED ORDER — HYDROCODONE-ACETAMINOPHEN 10-325 MG PO TABS: 1.0000 | ORAL_TABLET | Freq: Four times a day (QID) | ORAL | Status: DC | PRN

## 2014-01-13 MED ORDER — HYDROCODONE-ACETAMINOPHEN 10-325 MG PO TABS
1.0000 | ORAL_TABLET | Freq: Four times a day (QID) | ORAL | Status: DC | PRN
Start: 2014-01-13 — End: 2014-02-06

## 2014-01-13 NOTE — Telephone Encounter (Signed)
Rx ready for pick up. 

## 2014-01-13 NOTE — Telephone Encounter (Signed)
Gina please put up front

## 2014-01-13 NOTE — Telephone Encounter (Signed)
Pt aware rx at front desk

## 2014-01-13 NOTE — Telephone Encounter (Signed)
Patient aware  To pick up

## 2014-01-18 ENCOUNTER — Other Ambulatory Visit: Payer: Self-pay | Admitting: Cardiovascular Disease

## 2014-01-21 ENCOUNTER — Other Ambulatory Visit: Payer: Self-pay | Admitting: Family Medicine

## 2014-01-24 ENCOUNTER — Other Ambulatory Visit: Payer: Self-pay | Admitting: Family Medicine

## 2014-01-24 NOTE — Telephone Encounter (Signed)
Last seen 12/13/13  FPW

## 2014-01-24 NOTE — Telephone Encounter (Signed)
Patient needs to get this from his cardiologist. It is a specializedd drug for acute coronary syndrome. Refill denied.

## 2014-01-25 ENCOUNTER — Telehealth: Payer: Self-pay | Admitting: Family Medicine

## 2014-01-28 NOTE — Telephone Encounter (Signed)
[facility] NOTIFED THAT RX DENIED AND WOULD NEED TO CONTAC HIS CARDIOLOGIST

## 2014-01-28 NOTE — Telephone Encounter (Signed)
Patient needs to contact his cardiologist who Rx this!!! It is a special drug CAD Refill denied. Bring all medications at next office visit.

## 2014-02-06 ENCOUNTER — Other Ambulatory Visit: Payer: Self-pay | Admitting: Family Medicine

## 2014-02-06 ENCOUNTER — Telehealth: Payer: Self-pay | Admitting: Family Medicine

## 2014-02-06 MED ORDER — HYDROCODONE-ACETAMINOPHEN 10-325 MG PO TABS: 1.0000 | ORAL_TABLET | Freq: Four times a day (QID) | ORAL | Status: DC | PRN

## 2014-02-06 NOTE — Telephone Encounter (Signed)
Rx ready for pick up. 

## 2014-02-06 NOTE — Telephone Encounter (Signed)
Aware, script [facility] and Norco script at front.

## 2014-02-06 NOTE — Telephone Encounter (Signed)
Xanax refill 

## 2014-02-06 NOTE — Telephone Encounter (Signed)
Rx ready for nurse to Phone in.for xanax.

## 2014-02-08 ENCOUNTER — Ambulatory Visit (INDEPENDENT_AMBULATORY_CARE_PROVIDER_SITE_OTHER): Payer: Medicare Other | Admitting: Nurse Practitioner

## 2014-02-08 ENCOUNTER — Encounter: Payer: Self-pay | Admitting: Nurse Practitioner

## 2014-02-08 VITALS — BP 131/57 | HR 69 | Temp 98.6°F | Ht 65.0 in | Wt 158.4 lb

## 2014-02-08 DIAGNOSIS — L0291 Cutaneous abscess, unspecified: Secondary | ICD-10-CM

## 2014-02-08 DIAGNOSIS — L0390 Cellulitis, unspecified: Secondary | ICD-10-CM

## 2014-02-08 MED ORDER — CEPHALEXIN 500 MG PO CAPS
500.0000 mg | ORAL_CAPSULE | Freq: Three times a day (TID) | ORAL | Status: DC
Start: 2014-02-08 — End: 2014-03-09

## 2014-02-08 NOTE — Patient Instructions (Signed)
Wound Care Wound care helps prevent pain and infection.  You may need a tetanus shot if:  You cannot remember when you had your last tetanus shot.  You have never had a tetanus shot.  The injury broke your skin. If you need a tetanus shot and you choose not to have one, you may get tetanus. Sickness from tetanus can be serious. HOME CARE   Only take medicine as told by your doctor.  Clean the wound daily with mild soap and water.  Change any bandages (dressings) as told by your doctor.  Put medicated cream and a bandage on the wound as told by your doctor.  Change the bandage if it gets wet, dirty, or starts to smell.  Take showers. Do not take baths, swim, or do anything that puts your wound under water.  Rest and raise (elevate) the wound until the pain and puffiness (swelling) are better.  Keep all doctor visits as told. GET HELP RIGHT AWAY IF:   Yellowish-white fluid (pus) comes from the wound.  Medicine does not lessen your pain.  There is a red streak going away from the wound.  You have a fever. MAKE SURE YOU:   Understand these instructions.  Will watch your condition.  Will get help right away if you are not doing well or get worse. Document Released: 07/01/2008 Document Revised: 12/15/2011 Document Reviewed: 01/26/2011 Exit[employer] Patient Information 2014 [employer].  

## 2014-02-08 NOTE — Progress Notes (Signed)
   Subjective:    Patient ID: [name], male    DOB: 1946-09-17, 68 y.o.   MRN: [id number]  HPI Patient cut left forearm on lawnmower blade Monday night. Turning red and sore to touch. He is on effient and he can't get the bleeding  To stop.   Review of Systems  All other systems reviewed and are negative.      Objective:   Physical Exam  Constitutional: He is oriented to person, place, and time. He appears well-developed and well-nourished.  Cardiovascular: Normal rate, regular rhythm and normal heart sounds.   Pulmonary/Chest: Effort normal and breath sounds normal.  Neurological: He is alert and oriented to person, place, and time.  Skin:     3 2cm superficial lacerations to left forearm   Psychiatric: He has a normal mood and affect. His behavior is normal. Judgment and thought content normal.   BP 131/57  Pulse 69  Temp(Src) 98.6 F (37 C) (Oral)  Ht 5\' 5"  (1.651 m)  Wt 158 lb 6.4 oz (71.85 kg)  BMI 26.36 kg/m2  Wounds cleaned with peroxide and nacl Antibiotic ointment and pressure dressing applied.       Assessment & Plan:   1. Wound cellulitis    Meds ordered this encounter  Medications  . triamcinolone (KENALOG) 0.025 % cream    Sig:   . cephALEXin (KEFLEX) 500 MG capsule    Sig: Take 1 capsule (500 mg total) by mouth 3 (three) times daily.    Dispense:  30 capsule    Refill:  0    Order Specific Question:  Supervising Provider    Answer:  Chipper Herb [[id number]]  tetanus was given in 2011 Keep dsg on until tomorrow Clean with antibacterial soap Keep clean and dry Follow up prn   Mary-Margaret Hassell Done, FNP

## 2014-02-16 ENCOUNTER — Other Ambulatory Visit: Payer: Self-pay | Admitting: Cardiovascular Disease

## 2014-02-24 ENCOUNTER — Telehealth: Payer: Self-pay | Admitting: Family Medicine

## 2014-02-28 NOTE — Telephone Encounter (Signed)
appt to be made- lm for pt to contact myself.

## 2014-03-03 ENCOUNTER — Other Ambulatory Visit: Payer: Self-pay | Admitting: *Deleted

## 2014-03-03 MED ORDER — GABAPENTIN 300 MG PO CAPS: 300.0000 mg | ORAL_CAPSULE | Freq: Three times a day (TID) | ORAL | Status: DC

## 2014-03-04 ENCOUNTER — Other Ambulatory Visit: Payer: Self-pay | Admitting: Cardiovascular Disease

## 2014-03-09 ENCOUNTER — Other Ambulatory Visit: Payer: Self-pay | Admitting: *Deleted

## 2014-03-09 ENCOUNTER — Encounter: Payer: Self-pay | Admitting: Family Medicine

## 2014-03-09 ENCOUNTER — Ambulatory Visit (INDEPENDENT_AMBULATORY_CARE_PROVIDER_SITE_OTHER): Payer: Medicare Other | Admitting: Family Medicine

## 2014-03-09 VITALS — BP 135/61 | HR 74 | Temp 97.7°F | Ht 65.0 in | Wt 151.0 lb

## 2014-03-09 DIAGNOSIS — Z85118 Personal history of other malignant neoplasm of bronchus and lung: Secondary | ICD-10-CM

## 2014-03-09 DIAGNOSIS — E559 Vitamin D deficiency, unspecified: Secondary | ICD-10-CM

## 2014-03-09 DIAGNOSIS — E785 Hyperlipidemia, unspecified: Secondary | ICD-10-CM

## 2014-03-09 DIAGNOSIS — I25810 Atherosclerosis of coronary artery bypass graft(s) without angina pectoris: Secondary | ICD-10-CM

## 2014-03-09 DIAGNOSIS — N400 Enlarged prostate without lower urinary tract symptoms: Secondary | ICD-10-CM | POA: Diagnosis not present

## 2014-03-09 DIAGNOSIS — F411 Generalized anxiety disorder: Secondary | ICD-10-CM

## 2014-03-09 DIAGNOSIS — I10 Essential (primary) hypertension: Secondary | ICD-10-CM

## 2014-03-09 DIAGNOSIS — Z0000 Encounter for general adult medical examination without abnormal findings: Secondary | ICD-10-CM

## 2014-03-09 LAB — POCT URINALYSIS DIPSTICK
Bilirubin, UA: NEGATIVE
Glucose, UA: NEGATIVE
KETONES UA: NEGATIVE
LEUKOCYTES UA: NEGATIVE
NITRITE UA: NEGATIVE
PH UA: 5
Spec Grav, UA: 1.02
UROBILINOGEN UA: NEGATIVE

## 2014-03-09 LAB — POCT UA - MICROSCOPIC ONLY
CASTS, UR, LPF, POC: NEGATIVE
CRYSTALS, UR, HPF, POC: NEGATIVE
Epithelial cells, urine per micros: NEGATIVE
WBC, Ur, HPF, POC: NEGATIVE
YEAST UA: NEGATIVE

## 2014-03-09 MED ORDER — HYDROCODONE-ACETAMINOPHEN 10-325 MG PO TABS: 1.0000 | ORAL_TABLET | Freq: Four times a day (QID) | ORAL | Status: DC | PRN

## 2014-03-09 MED ORDER — ALPRAZOLAM 1 MG PO TABS
1.0000 mg | ORAL_TABLET | Freq: Three times a day (TID) | ORAL | Status: DC | PRN
Start: 2014-03-09 — End: 2014-04-05

## 2014-03-09 NOTE — Patient Instructions (Addendum)
Medicare Annual Wellness Visit  [facility] and the medical providers at [facility] strive to bring you the best medical care.  In doing so we not only want to address your current medical conditions and concerns but also to detect new conditions early and prevent illness, disease and health-related problems.    Medicare offers a yearly Wellness Visit which allows our clinical staff to assess your need for preventative services including immunizations, lifestyle education, counseling to decrease risk of preventable diseases and screening for fall risk and other medical concerns.    This visit is provided free of charge (no copay) for all Medicare recipients. The clinical pharmacists at [facility] have begun to conduct these Wellness Visits which will also include a thorough review of all your medications.    As you primary medical provider recommend that you make an appointment for your Annual Wellness Visit if you have not done so already this year.  You may set up this appointment before you leave today or you may call back ([phone]) and schedule an appointment.  Please make sure when you call that you mention that you are scheduling your Annual Wellness Visit with the clinical pharmacist so that the appointment may be made for the proper length of time.      Continue current medications. Continue good therapeutic lifestyle changes which include good diet and exercise. Fall precautions discussed with patient. If an FOBT was given today- please return it to our front desk. If you are over 32 years old - you may need Prevnar 59 or the adult Pneumonia vaccine.  Continue current medications Continue followup with specialist, the gastroenterologist, the cardiologist, and the oncologist. We will call you with the results of the lab work once those results are available Please return the FOBT

## 2014-03-09 NOTE — Progress Notes (Signed)
Subjective:    Patient ID: [name], male    DOB: 26-Sep-1946, 68 y.o.   MRN: [id number]  HPI Pt here for follow up and management of chronic medical problems. Patient has a history of lung cancer with radiation  of the involved lung. He has a history of coronary artery disease and chronic pain. He also has hypertension hyperlipidemia and a CABG. He complains today of a rash on his left arm and myalgias. The muscle aches and myalgias have been going on for years. He also is contributes D. problems with his legs to a history of chemotherapy. They do describe a history of colon cancer and he was seen by the gastroenterologist about one year ago for a colonoscopy. The gastroenterologist indicated that colonoscopy was good. He sees the cardiologist and gastroenterologist and the oncologist.        Patient Active Problem List   Diagnosis Date Noted  . Scabies 10/28/2013  . Rash and nonspecific skin eruption 10/28/2013  . Need for prophylactic vaccination and inoculation against influenza 09/24/2013  . Hyperlipemia 09/24/2013  . Ecchymosis 09/24/2013  . Mild eczema 09/24/2013  . Chronic pain syndrome 06/21/2013  . Anxiety state, unspecified 03/17/2013  . 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 THROMBOCYTOPENIA 12/04/2010  . FEMORAL BRUIT 12/04/2010  . HYPERTENSION, BENIGN 12/13/2008  . CAD, ARTERY BYPASS GRAFT 12/13/2008  . 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 HYPERLIPIDEMIA 10/16/2008  . HYPERTENSION 10/16/2008  . MYOCARDIAL INFARCTION 10/16/2008  . ALLERGIC RHINITIS 10/16/2008  . Swelling, mass, or lump in chest 10/16/2008   Outpatient Encounter Prescriptions as of 03/09/2014  Medication Sig  . ALPRAZolam (XANAX) 1 MG tablet Take 1 tablet (1 mg total) by mouth 3 (three) times daily as needed for anxiety.  Marland Kitchen amLODipine (NORVASC) 10 MG tablet TAKE 1 TABLET EVERY DAY  . aspirin EC 81 MG tablet Take 1 tablet (81 mg total) by mouth daily.    Marland Kitchen EFFIENT 10 MG TABS tablet TAKE 1 TABLET EVERY DAY  . fenofibrate 160 MG tablet Take 160 mg by mouth daily.  Marland Kitchen gabapentin (NEURONTIN) 300 MG capsule Take 1 capsule (300 mg total) by mouth every 8 (eight) hours.  . hydrochlorothiazide (MICROZIDE) 12.5 MG capsule TAKE 1 CAPSULE EVERY DAY  . HYDROcodone-acetaminophen (NORCO) 10-325 MG per tablet Take 1 tablet by mouth every 6 (six) hours as needed for moderate pain.  . metoprolol (LOPRESSOR) 50 MG tablet TAKE 1 TABLET (50 MG TOTAL) BY MOUTH 2 (TWO) TIMES DAILY.  . Multiple Vitamin (MULTIVITAMIN) capsule Take 1 capsule by mouth daily.    . nitroGLYCERIN (NITROSTAT) 0.4 MG SL tablet Place 0.4 mg under the tongue every 5 (five) minutes as needed. After 3 doses and no relief call 911  . potassium chloride (KLOR-CON) 20 MEQ packet Take 20 mEq by mouth daily.  . prasugrel (EFFIENT) 10 MG TABS tablet Take 10 mg by mouth daily.  . rosuvastatin (CRESTOR) 40 MG tablet Take 1 tablet (40 mg total) by mouth daily.  Marland Kitchen triamcinolone (KENALOG) 0.025 % cream   . [DISCONTINUED] amLODipine (NORVASC) 10 MG tablet Take 10 mg by mouth daily.  . [DISCONTINUED] cephALEXin (KEFLEX) 500 MG capsule Take 1 capsule (500 mg total) by mouth 3 (three) times daily.  . [DISCONTINUED] metoprolol (LOPRESSOR) 50 MG tablet Take 50 mg by mouth 2 (two) times daily.    Review of Systems  Constitutional: Negative.   HENT: Negative.   Eyes: Negative.   Respiratory: Negative.  1 lung   Cardiovascular: Negative.   Gastrointestinal: Negative.   Endocrine: Negative.   Genitourinary: Negative.   Musculoskeletal: Positive for myalgias (whole bady aches).  Skin: Negative.        Rash- left arm- been on Keflex  Allergic/Immunologic: Negative.   Neurological: Negative.   Hematological: Negative.   Psychiatric/Behavioral: Negative.        Objective:   Physical Exam  Nursing note and vitals reviewed. Constitutional: He is oriented to person, place, and time. He appears  well-developed and well-nourished. No distress.  HENT:  Head: Normocephalic and atraumatic.  Right Ear: External ear normal.  Left Ear: External ear normal.  Nose: Nose normal.  Mouth/Throat: Oropharynx is clear and moist. No oropharyngeal exudate.  Eyes: Conjunctivae and EOM are normal. Pupils are equal, round, and reactive to light. Right eye exhibits no discharge. Left eye exhibits no discharge. No scleral icterus.  Neck: Normal range of motion. Neck supple. No thyromegaly present.  The patient has bilateral carotid bruits  Cardiovascular: Normal rate, regular rhythm and normal heart sounds.  Exam reveals no gallop and no friction rub.   No murmur heard. The patient has diminished pedal and femoral pulses bilaterally he also has a diminished left radial pulse.  Pulmonary/Chest: Effort normal and breath sounds normal. No respiratory distress. He has no wheezes. He has no rales. He exhibits no tenderness.  Abdominal: Soft. Bowel sounds are normal. He exhibits no mass. There is no tenderness. There is no rebound and no guarding.  The inguinal pulses were also diminished bilaterally.  Genitourinary: Rectum normal, prostate normal and penis normal.  The prostate was enlarged but soft and smooth there were no rectal masses. There were no hemorrhoids. There is no inguinal hernia and the external genitalia were normal.  Musculoskeletal: Normal range of motion. He exhibits no edema and no tenderness.  Lymphadenopathy:    He has no cervical adenopathy.  Neurological: He is alert and oriented to person, place, and time. He has normal reflexes. No cranial nerve deficit.  Skin: Skin is warm and dry. No rash noted. No erythema. No pallor.  Psychiatric: He has a normal mood and affect. His behavior is normal. Judgment and thought content normal.   BP 135/61  Pulse 74  Temp(Src) 97.7 F (36.5 C) (Oral)  Ht 5' 5"  (1.651 m)  Wt 151 lb (68.493 kg)  BMI 25.13 kg/m2        Assessment & Plan:  1.  CAD, ARTERY BYPASS GRAFT - POCT CBC  2. Hyperlipemia - POCT CBC - NMR, lipoprofile  3. HYPERTENSION, BENIGN - POCT CBC - BMP8+EGFR - Hepatic function panel  4. Vitamin D deficiency - Vit D  25 hydroxy (rtn osteoporosis monitoring)  5. Healthcare maintenance - PSA, total and free - POCT UA - Microscopic Only - POCT urinalysis dipstick  6. BPH (benign prostatic hyperplasia)  7. History of lung cancer   Patient Instructions                       Medicare Annual Wellness Visit  [facility] and the medical providers at [facility] strive to bring you the best medical care.  In doing so we not only want to address your current medical conditions and concerns but also to detect new conditions early and prevent illness, disease and health-related problems.    Medicare offers a yearly Wellness Visit which allows our clinical staff to assess your need for preventative services including immunizations, lifestyle  education, counseling to decrease risk of preventable diseases and screening for fall risk and other medical concerns.    This visit is provided free of charge (no copay) for all Medicare recipients. The clinical pharmacists at [facility] have begun to conduct these Wellness Visits which will also include a thorough review of all your medications.    As you primary medical provider recommend that you make an appointment for your Annual Wellness Visit if you have not done so already this year.  You may set up this appointment before you leave today or you may call back ([phone]) and schedule an appointment.  Please make sure when you call that you mention that you are scheduling your Annual Wellness Visit with the clinical pharmacist so that the appointment may be made for the proper length of time.      Continue current medications. Continue good therapeutic lifestyle changes which include good diet and exercise. Fall precautions  discussed with patient. If an FOBT was given today- please return it to our front desk. If you are over 65 years old - you may need Prevnar 68 or the adult Pneumonia vaccine.  Continue current medications Continue followup with specialist, the gastroenterologist, the cardiologist, and the oncologist. We will call you with the results of the lab work once those results are available Please return the FOBT   Arrie Senate MD

## 2014-03-10 LAB — CBC WITH DIFFERENTIAL
BASOS ABS: 0 10*3/uL (ref 0.0–0.2)
Basos: 0 %
Eos: 4 %
Eosinophils Absolute: 0.2 10*3/uL (ref 0.0–0.4)
HCT: 42.4 % (ref 37.5–51.0)
Hemoglobin: 14.3 g/dL (ref 12.6–17.7)
IMMATURE GRANULOCYTES: 0 %
Immature Grans (Abs): 0 10*3/uL (ref 0.0–0.1)
LYMPHS: 17 %
Lymphocytes Absolute: 1.1 10*3/uL (ref 0.7–3.1)
MCH: 29.6 pg (ref 26.6–33.0)
MCHC: 33.7 g/dL (ref 31.5–35.7)
MCV: 88 fL (ref 79–97)
MONOCYTES: 6 %
Monocytes Absolute: 0.4 10*3/uL (ref 0.1–0.9)
NEUTROS PCT: 73 %
Neutrophils Absolute: 4.6 10*3/uL (ref 1.4–7.0)
PLATELETS: 148 10*3/uL — AB (ref 150–379)
RBC: 4.83 x10E6/uL (ref 4.14–5.80)
RDW: 13.6 % (ref 12.3–15.4)
WBC: 6.3 10*3/uL (ref 3.4–10.8)

## 2014-03-10 LAB — BMP8+EGFR
BUN/Creatinine Ratio: 12 (ref 10–22)
BUN: 11 mg/dL (ref 8–27)
CALCIUM: 9.6 mg/dL (ref 8.6–10.2)
CO2: 23 mmol/L (ref 18–29)
Chloride: 106 mmol/L (ref 97–108)
Creatinine, Ser: 0.89 mg/dL (ref 0.76–1.27)
GFR calc Af Amer: 102 mL/min/{1.73_m2} (ref >59)
GFR calc non Af Amer: 88 mL/min/{1.73_m2} (ref >59)
Glucose: 94 mg/dL (ref 65–99)
Potassium: 4.1 mmol/L (ref 3.5–5.2)
SODIUM: 145 mmol/L — AB (ref 134–144)

## 2014-03-10 LAB — NMR, LIPOPROFILE
CHOLESTEROL: 137 mg/dL (ref 100–199)
HDL Cholesterol by NMR: 38 mg/dL — ABNORMAL LOW (ref >39)
HDL Particle Number: 36.6 umol/L (ref >=30.5)
LDL PARTICLE NUMBER: 924 nmol/L (ref <1000)
LDL SIZE: 20.5 nm (ref >20.5)
LDLC SERPL CALC-MCNC: 70 mg/dL (ref 0–99)
LP-IR SCORE: 80 — AB (ref <=45)
SMALL LDL PARTICLE NUMBER: 364 nmol/L (ref <=527)
Triglycerides by NMR: 147 mg/dL (ref 0–149)

## 2014-03-10 LAB — HEPATIC FUNCTION PANEL
ALT: 16 IU/L (ref 0–44)
AST: 18 IU/L (ref 0–40)
Albumin: 4.6 g/dL (ref 3.6–4.8)
Alkaline Phosphatase: 54 IU/L (ref 39–117)
Bilirubin, Direct: 0.11 mg/dL (ref 0.00–0.40)
Total Bilirubin: 0.3 mg/dL (ref 0.0–1.2)
Total Protein: 7 g/dL (ref 6.0–8.5)

## 2014-03-10 LAB — PSA, TOTAL AND FREE
PSA, Free Pct: 55 %
PSA, Free: 0.22 ng/mL
PSA: 0.4 ng/mL (ref 0.0–4.0)

## 2014-03-10 LAB — VITAMIN D 25 HYDROXY (VIT D DEFICIENCY, FRACTURES): Vit D, 25-Hydroxy: 68.2 ng/mL (ref 30.0–100.0)

## 2014-03-14 ENCOUNTER — Other Ambulatory Visit: Payer: Medicare Other

## 2014-03-14 DIAGNOSIS — Z1212 Encounter for screening for malignant neoplasm of rectum: Secondary | ICD-10-CM | POA: Diagnosis not present

## 2014-03-14 NOTE — Progress Notes (Signed)
Patient dropped off fobt 

## 2014-03-15 LAB — FECAL OCCULT BLOOD, IMMUNOCHEMICAL: Fecal Occult Bld: POSITIVE — AB

## 2014-03-16 ENCOUNTER — Telehealth: Payer: Self-pay | Admitting: Family Medicine

## 2014-03-16 NOTE — Telephone Encounter (Signed)
Pt aware of lab results.  Repeat CBC and FOBT to be done 03/23/14.

## 2014-03-16 NOTE — Telephone Encounter (Signed)
Message copied by Waverly Ferrari on Thu Mar 16, 2014  2:44 PM ------      Message from: Chipper Herb      Created: Wed Mar 15, 2014  9:49 PM       Please let patient know that the FOBT is positive. His most recent hemoglobin was good. Wait one week and repeat the FOBT and a CBC ------

## 2014-03-23 ENCOUNTER — Other Ambulatory Visit (INDEPENDENT_AMBULATORY_CARE_PROVIDER_SITE_OTHER): Payer: Medicare Other

## 2014-03-23 DIAGNOSIS — R7989 Other specified abnormal findings of blood chemistry: Secondary | ICD-10-CM | POA: Diagnosis not present

## 2014-03-24 LAB — CBC WITH DIFFERENTIAL
Basophils Absolute: 0 10*3/uL (ref 0.0–0.2)
Basos: 1 %
EOS ABS: 0.3 10*3/uL (ref 0.0–0.4)
Eos: 6 %
HCT: 38.9 % (ref 37.5–51.0)
Hemoglobin: 13.1 g/dL (ref 12.6–17.7)
IMMATURE GRANS (ABS): 0 10*3/uL (ref 0.0–0.1)
IMMATURE GRANULOCYTES: 0 %
Lymphocytes Absolute: 0.8 10*3/uL (ref 0.7–3.1)
Lymphs: 16 %
MCH: 29.6 pg (ref 26.6–33.0)
MCHC: 33.7 g/dL (ref 31.5–35.7)
MCV: 88 fL (ref 79–97)
MONOS ABS: 0.5 10*3/uL (ref 0.1–0.9)
Monocytes: 10 %
NEUTROS PCT: 67 %
Neutrophils Absolute: 3.5 10*3/uL (ref 1.4–7.0)
Platelets: 129 10*3/uL — ABNORMAL LOW (ref 150–379)
RBC: 4.43 x10E6/uL (ref 4.14–5.80)
RDW: 13.7 % (ref 12.3–15.4)
WBC: 5.1 10*3/uL (ref 3.4–10.8)

## 2014-04-05 ENCOUNTER — Telehealth: Payer: Self-pay | Admitting: Family Medicine

## 2014-04-05 ENCOUNTER — Other Ambulatory Visit: Payer: Self-pay | Admitting: *Deleted

## 2014-04-05 DIAGNOSIS — F411 Generalized anxiety disorder: Secondary | ICD-10-CM

## 2014-04-05 MED ORDER — ALPRAZOLAM 1 MG PO TABS: 1.0000 mg | ORAL_TABLET | Freq: Three times a day (TID) | ORAL | Status: DC | PRN

## 2014-04-05 MED ORDER — HYDROCODONE-ACETAMINOPHEN 10-325 MG PO TABS: 1.0000 | ORAL_TABLET | Freq: Four times a day (QID) | ORAL | Status: DC | PRN

## 2014-04-05 MED ORDER — FENOFIBRATE 160 MG PO TABS: 160.0000 mg | ORAL_TABLET | Freq: Every day | ORAL | Status: DC

## 2014-04-05 NOTE — Telephone Encounter (Signed)
These are OK to refill

## 2014-04-17 ENCOUNTER — Other Ambulatory Visit: Payer: Self-pay | Admitting: Family Medicine

## 2014-04-18 ENCOUNTER — Other Ambulatory Visit: Payer: Self-pay | Admitting: Cardiovascular Disease

## 2014-04-27 ENCOUNTER — Ambulatory Visit (INDEPENDENT_AMBULATORY_CARE_PROVIDER_SITE_OTHER): Payer: Medicare Other | Admitting: Family Medicine

## 2014-04-27 ENCOUNTER — Encounter: Payer: Self-pay | Admitting: Family Medicine

## 2014-04-27 VITALS — BP 119/64 | HR 68 | Temp 97.2°F | Ht 65.0 in | Wt 154.4 lb

## 2014-04-27 DIAGNOSIS — L0100 Impetigo, unspecified: Secondary | ICD-10-CM

## 2014-04-27 DIAGNOSIS — I25810 Atherosclerosis of coronary artery bypass graft(s) without angina pectoris: Secondary | ICD-10-CM | POA: Diagnosis not present

## 2014-04-27 DIAGNOSIS — R21 Rash and other nonspecific skin eruption: Secondary | ICD-10-CM

## 2014-04-27 MED ORDER — METHYLPREDNISOLONE ACETATE 80 MG/ML IJ SUSP
80.0000 mg | Freq: Once | INTRAMUSCULAR | Status: AC
  Administered 2014-04-27: 80 mg via INTRAMUSCULAR

## 2014-04-27 MED ORDER — DOXYCYCLINE HYCLATE 100 MG PO TABS: 100.0000 mg | ORAL_TABLET | Freq: Two times a day (BID) | ORAL | Status: DC

## 2014-04-27 NOTE — Progress Notes (Signed)
   Subjective:    Patient ID: NEWMAN WAREN, male    DOB: 11-Apr-1946, 68 y.o.   MRN: [id number]  HPI  This 68 y.o. male presents for evaluation of rash on left forearm.  He was tx'd for cellulitis on the left arm with keflex and then rx'd kenalog cream for rash on left arm.  He states when he gets out in sun it makes the rash worse.  Review of Systems C/o red rash on left forearm   No chest pain, SOB, HA, dizziness, vision change, N/V, diarrhea, constipation, dysuria, urinary urgency or frequency, myalgias, arthralgias or rash.  Objective:   Physical Exam   Left arm with erythema and scaling skin on the forearm area     Assessment & Plan:  Impetigo - Plan: methylPREDNISolone acetate (DEPO-MEDROL) injection 80 mg, doxycycline (VIBRA-TABS) 100 MG tablet  Rash and nonspecific skin eruption - Plan: methylPREDNISolone acetate (DEPO-MEDROL) injection 80 mg, doxycycline (VIBRA-TABS) 100 MG tablet po bid x 10 days  Lysbeth Penner FNP

## 2014-05-08 ENCOUNTER — Telehealth: Payer: Self-pay | Admitting: *Deleted

## 2014-05-08 ENCOUNTER — Other Ambulatory Visit: Payer: Self-pay | Admitting: Family Medicine

## 2014-05-08 DIAGNOSIS — F411 Generalized anxiety disorder: Secondary | ICD-10-CM

## 2014-05-08 MED ORDER — HYDROCODONE-ACETAMINOPHEN 10-325 MG PO TABS: 1.0000 | ORAL_TABLET | Freq: Four times a day (QID) | ORAL | Status: DC | PRN

## 2014-05-08 MED ORDER — ALPRAZOLAM 1 MG PO TABS: 1.0000 mg | ORAL_TABLET | Freq: Three times a day (TID) | ORAL | Status: DC | PRN

## 2014-05-08 NOTE — Telephone Encounter (Signed)
Pt's wife notified prescriptions are ready for pick up RXs to the front for pt pick up

## 2014-05-08 NOTE — Telephone Encounter (Signed)
Last seen by you 03/09/14, last filled 04/05/14, pls print

## 2014-05-08 NOTE — Telephone Encounter (Signed)
Both of these prescriptions are okay refill x1

## 2014-05-08 NOTE — Telephone Encounter (Signed)
Please refill these medications if the timing is appropriate and make sure that patient has an appointment to be seen by provider for future refills

## 2014-05-09 NOTE — Telephone Encounter (Signed)
Patient aware.

## 2014-05-12 ENCOUNTER — Ambulatory Visit (INDEPENDENT_AMBULATORY_CARE_PROVIDER_SITE_OTHER): Payer: Medicare Other | Admitting: Family Medicine

## 2014-05-12 ENCOUNTER — Encounter: Payer: Self-pay | Admitting: Family Medicine

## 2014-05-12 VITALS — BP 139/66 | HR 70 | Temp 97.2°F | Ht 65.0 in | Wt 159.0 lb

## 2014-05-12 DIAGNOSIS — I25810 Atherosclerosis of coronary artery bypass graft(s) without angina pectoris: Secondary | ICD-10-CM | POA: Diagnosis not present

## 2014-05-12 DIAGNOSIS — R21 Rash and other nonspecific skin eruption: Secondary | ICD-10-CM

## 2014-05-12 DIAGNOSIS — L0100 Impetigo, unspecified: Secondary | ICD-10-CM

## 2014-05-12 MED ORDER — DOXYCYCLINE HYCLATE 100 MG PO TABS: 100.0000 mg | ORAL_TABLET | Freq: Two times a day (BID) | ORAL | Status: DC

## 2014-05-12 MED ORDER — METHYLPREDNISOLONE ACETATE 80 MG/ML IJ SUSP
80.0000 mg | Freq: Once | INTRAMUSCULAR | Status: AC
  Administered 2014-05-12: 80 mg via INTRAMUSCULAR

## 2014-05-12 NOTE — Progress Notes (Signed)
   Subjective:    Patient ID: [name], male    DOB: [birth year], 68 y.o.   MRN: [id number]  HPI This 68 y.o. male presents for evaluation of rash on right arm and now on legs.  He was tx'd with doxycycline and is some better but still has some left forearm rash.   Review of Systems No chest pain, SOB, HA, dizziness, vision change, N/V, diarrhea, constipation, dysuria, urinary urgency or frequency, myalgias, arthralgias or rash.     Objective:   Physical Exam Vital signs noted  Well developed well nourished male.  HEENT - Head atraumatic Normocephalic                Throat - oropharanx wnl Respiratory - Lungs CTA bilateral Cardiac - RRR S1 and S2 without murmur GI - Abdomen soft Nontender and bowel sounds active x 4 Extremities - No edema. Neuro - Grossly intact. Skin - Erythematous rash over left forearm and bilateral legs      Assessment & Plan:  Impetigo - Plan: doxycycline (VIBRA-TABS) 100 MG tablet, methylPREDNISolone acetate (DEPO-MEDROL) injection 80 mg  Rash and nonspecific skin eruption - Plan: doxycycline (VIBRA-TABS) 100 MG tablet, methylPREDNISolone acetate (DEPO-MEDROL) injection 80 mg  Lysbeth Penner FNP

## 2014-05-17 ENCOUNTER — Other Ambulatory Visit: Payer: Self-pay | Admitting: Cardiovascular Disease

## 2014-05-19 ENCOUNTER — Other Ambulatory Visit: Payer: Self-pay | Admitting: Cardiovascular Disease

## 2014-05-25 ENCOUNTER — Telehealth: Payer: Self-pay | Admitting: Family Medicine

## 2014-05-25 NOTE — Telephone Encounter (Signed)
appt scheduled

## 2014-05-29 ENCOUNTER — Ambulatory Visit (INDEPENDENT_AMBULATORY_CARE_PROVIDER_SITE_OTHER): Payer: Medicare Other | Admitting: Family Medicine

## 2014-05-29 ENCOUNTER — Encounter: Payer: Self-pay | Admitting: Family Medicine

## 2014-05-29 VITALS — BP 161/66 | HR 58 | Temp 99.1°F | Ht 65.0 in | Wt 152.0 lb

## 2014-05-29 DIAGNOSIS — R21 Rash and other nonspecific skin eruption: Secondary | ICD-10-CM | POA: Diagnosis not present

## 2014-05-29 DIAGNOSIS — L0100 Impetigo, unspecified: Secondary | ICD-10-CM | POA: Diagnosis not present

## 2014-05-29 DIAGNOSIS — E538 Deficiency of other specified B group vitamins: Secondary | ICD-10-CM | POA: Diagnosis not present

## 2014-05-29 DIAGNOSIS — R5381 Other malaise: Secondary | ICD-10-CM | POA: Diagnosis not present

## 2014-05-29 DIAGNOSIS — R5383 Other fatigue: Secondary | ICD-10-CM | POA: Diagnosis not present

## 2014-05-29 DIAGNOSIS — I25810 Atherosclerosis of coronary artery bypass graft(s) without angina pectoris: Secondary | ICD-10-CM

## 2014-05-29 MED ORDER — DOXYCYCLINE HYCLATE 100 MG PO TABS: 100.0000 mg | ORAL_TABLET | Freq: Two times a day (BID) | ORAL | Status: DC

## 2014-05-29 MED ORDER — CYANOCOBALAMIN 1000 MCG/ML IJ SOLN
1000.0000 ug | INTRAMUSCULAR | Status: DC
  Administered 2014-05-29 – 2015-04-02 (×9): 1000 ug via INTRAMUSCULAR

## 2014-05-29 NOTE — Progress Notes (Signed)
   Subjective:    Patient ID: [name], male    DOB: [birth year], 68 y.o.   MRN: [id number]  HPI  This 68 y.o. male presents for evaluation of rash and impetigo which is about healed up according to patient.  Review of Systems    No chest pain, SOB, HA, dizziness, vision change, N/V, diarrhea, constipation, dysuria, urinary urgency or frequency, myalgias, arthralgias or rash.  Objective:   Physical Exam   Right arm with rash and impetigo along forearm.     Assessment & Plan:  Impetigo - Plan: doxycycline (VIBRA-TABS) 100 MG tablet  Rash and nonspecific skin eruption - Plan: doxycycline (VIBRA-TABS) 100 MG tablet  Other malaise and fatigue - Plan: Vitamin B12, cyanocobalamin ((VITAMIN B-12)) injection 1,000 mcg  Lysbeth Penner FNP

## 2014-06-05 ENCOUNTER — Telehealth: Payer: Self-pay | Admitting: Family Medicine

## 2014-06-05 MED ORDER — HYDROCODONE-ACETAMINOPHEN 10-325 MG PO TABS: 1.0000 | ORAL_TABLET | Freq: Four times a day (QID) | ORAL | Status: DC | PRN

## 2014-06-05 NOTE — Telephone Encounter (Signed)
Sent to dwm to sign patient aware

## 2014-06-05 NOTE — Telephone Encounter (Signed)
Please refill these medications for this patient x1

## 2014-06-16 ENCOUNTER — Encounter: Payer: Self-pay | Admitting: Cardiovascular Disease

## 2014-06-16 ENCOUNTER — Ambulatory Visit (INDEPENDENT_AMBULATORY_CARE_PROVIDER_SITE_OTHER): Payer: Medicare Other | Admitting: Cardiovascular Disease

## 2014-06-16 VITALS — BP 128/58 | HR 59 | Ht 65.0 in | Wt 152.0 lb

## 2014-06-16 DIAGNOSIS — I10 Essential (primary) hypertension: Secondary | ICD-10-CM

## 2014-06-16 DIAGNOSIS — I25810 Atherosclerosis of coronary artery bypass graft(s) without angina pectoris: Secondary | ICD-10-CM

## 2014-06-16 DIAGNOSIS — I25719 Atherosclerosis of autologous vein coronary artery bypass graft(s) with unspecified angina pectoris: Secondary | ICD-10-CM

## 2014-06-16 DIAGNOSIS — I209 Angina pectoris, unspecified: Secondary | ICD-10-CM | POA: Diagnosis not present

## 2014-06-16 MED ORDER — ISOSORBIDE MONONITRATE ER 30 MG PO TB24: 30.0000 mg | ORAL_TABLET | Freq: Every day | ORAL | Status: DC

## 2014-06-16 NOTE — Patient Instructions (Signed)
Your physician has recommended you make the following change in your medication: 1. Start Imdur 30 Mg 1 tablet daily  Your physician has requested that you have a carotid duplex. This test is an ultrasound of the carotid arteries in your neck. It looks at blood flow through these arteries that supply the brain with blood. Allow one hour for this exam. There are no restrictions or special instructions. ( please schedule at check out today before leaving. Order in [facility])  Your physician wants you to follow-up in: 1 year with Dr Emelda Fear will receive a reminder letter in the mail two months in advance. If you don't receive a letter, please call our office to schedule the follow-up appointment.

## 2014-06-16 NOTE — Progress Notes (Signed)
HPI:  68 year old gentleman presenting for followup evaluation. The patient has been followed by Dr. wall. He has a history of coronary artery disease status post CABG, carotid stenosis status post right carotid endarterectomy, hypertension, and hyperlipidemia. The patient also has a history of lung cancer. He had a recent carotid duplex scan 12/07/2013. This demonstrated chronic occlusion of the left internal carotid artery. He has 60-79% stenosis on the right systolic velocities greater than 161 and diastolic velocities well below 100.   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 He underwent PCI of a totally occluded vein graft to the obtuse marginal. He then underwent staged PCI of another saphenous vein graft a few days later. He's had no recurrent ischemic event since that time.  He presented in March of this year with progressive anginal symptoms and underwent cardiac catheterization. This demonstrated continued patency of the LIMA to LAD and vein graft sequential to OM 3 and left posterolateral. The vein graft sequence to the OM1 and OM 2 branches was totally occluded. He was noted to have mild LV dysfunction with an ejection fraction of 45%. Medical therapy was recommended.  The patient is having occasional left-sided chest discomfort. Sometimes this occurs with exertion and other times it is at rest. Episodes have been self-limited. He does occasionally take a sublingual nitroglycerin and this promptly relieves his pain. He denies shortness of breath, cough, fevers, chills, or heart palpitations. He has followup for his lung cancer next month with Dr. Pablo Ledger. He's had problems with a "staph infection" on his left arm. He's had antibiotics for this. The area has not completely cleared to date.   Outpatient Encounter Prescriptions as of 06/16/2014  Medication Sig  . ALPRAZolam (XANAX) 1 MG tablet Take 1 tablet (1 mg  total) by mouth 3 (three) times daily as needed for anxiety.  Marland Kitchen amLODipine (NORVASC) 10 MG tablet TAKE 1 TABLET EVERY DAY  . aspirin EC 81 MG tablet Take 1 tablet (81 mg total) by mouth daily.  Marland Kitchen EFFIENT 10 MG TABS tablet TAKE 1 TABLET EVERY DAY  . fenofibrate 160 MG tablet Take 1 tablet (160 mg total) by mouth daily.  Marland Kitchen gabapentin (NEURONTIN) 300 MG capsule Take 1 capsule (300 mg total) by mouth every 8 (eight) hours.  . hydrochlorothiazide (MICROZIDE) 12.5 MG capsule TAKE 1 CAPSULE EVERY DAY  . HYDROcodone-acetaminophen (NORCO) 10-325 MG per tablet Take 1 tablet by mouth every 6 (six) hours as needed for moderate pain.  Marland Kitchen KLOR-CON M20 20 MEQ tablet TAKE 1 TABLET BY MOUTH DAILY  . metoprolol (LOPRESSOR) 50 MG tablet TAKE 1 TABLET (50 MG TOTAL) BY MOUTH 2 (TWO) TIMES DAILY.  . Multiple Vitamin (MULTIVITAMIN) capsule Take 1 capsule by mouth daily.    . nitroGLYCERIN (NITROSTAT) 0.4 MG SL tablet Place 0.4 mg under the tongue every 5 (five) minutes as needed. After 3 doses and no relief call 911  . potassium chloride (KLOR-CON) 20 MEQ packet Take 20 mEq by mouth daily.  . rosuvastatin (CRESTOR) 40 MG tablet Take 1 tablet (40 mg total) by mouth daily.  . [DISCONTINUED] doxycycline (VIBRA-TABS) 100 MG tablet Take 1 tablet (100 mg total) by mouth 2 (two) times daily.  . [DISCONTINUED] prasugrel (EFFIENT) 10 MG TABS tablet Take 10 mg by mouth daily.  . [DISCONTINUED] triamcinolone (KENALOG) 0.025 % cream     Allergies  Allergen Reactions  . Demerol Nausea And Vomiting  . Clopidogrel  Bisulfate     Severe rash  . Meperidine Hcl   . Penicillins     Rash   . Propoxyphene N-Acetaminophen     nauesa    Past Medical History  Diagnosis Date  . Hypertension   . Hyperlipidemia   . CAD (coronary artery disease)     Dr. Verl Blalock  - Cardiologist   . Dementia   . PVD (peripheral vascular disease)   . ASCVD (arteriosclerotic cardiovascular disease)   . Anxiety     Dr. Corrinne Eagle    . OA (osteoarthritis)    . Neuropathic pain   . History of radiation therapy 11/15/08-02/23/09    RUL 66Gy/44fx  . Lung cancer     lung ca dx 1/10    ROS: Negative except as per HPI  BP 128/58  Pulse 59  Ht 5\' 5"  (1.651 m)  Wt 152 lb (68.947 kg)  BMI 25.29 kg/m2  PHYSICAL EXAM: Pt is alert and oriented, NAD HEENT: normal Neck: JVP - normal, carotids 2+= with a loud right carotid bruit Lungs: CTA bilaterally CV: RRR without murmur or gallop Abd: soft, NT, Positive BS, no hepatomegaly Ext: no C/C/E, there is a rash on the left forearm Skin: warm/dry no rash  EKG:  Normal sinus rhythm 59 beats per minute, nonspecific ST/T wave abnormality.  Cardiac Cath 12/16/2013: Procedural Findings:   Hemodynamics:  AO 138/54  LV 132/24  Coronary angiography:  Coronary dominance: right  Left mainstem: total occlusion at the ostium  Left anterior descending (LAD): fills from LIMA  Left circumflex (LCx): fills from SVG  Right coronary artery (RCA): small nondominant. Heavily calcified ostium. Patent throughout.  LIMA - LAD: widely patent throughout. Mid/distal LAD are patent. prox LAD fills retrograde from the graft. There is faint diagonal filling of diffusely diseased vessels in retrograde fashion.  SVG - OM1 and OM2: total occlusion at aortic anastomosis  SVG - OM3 and LPL: patent stents in mid-body of graft. Mild 40% stenosis at OM3 insertion site. LPL and L PDA fill from this graft  Left ventriculography: Mild anterolateral hypokinesis. LVEF estimated at 45%  Final Conclusions:  1. Severe native 2V CAD with total occlusion of the LM  2. Heavily calcified but patent nondominant RCA  3. S/P CABG with continued patency of the LIMA-LAD and SVG-OM3 and LPL  4. Total occlusion of the SVG-OM1 and OM2  5. Mild segmental LV systolic dysfunction  Recommendations: aggressive medical therapy. I don't think there are any targets for revascularization.   ASSESSMENT AND PLAN: 1. Coronary artery disease, native vessel  and autologous bypass graft disease. The patient has degenerated vein graft disease. His most recent cardiac catheterization study was reviewed and there are no areas for intervention. He has CCS class II anginal symptoms. I'm going to add isosorbide 30 mg daily. He otherwise was instructed to continue on his current medical program. I will see him back in one year, or sooner if symptoms progress. I think he should continue on dual antiplatelet therapy because of extensive vein graft disease and previous PCI.  2. Hypertension. Pressure is controlled on amlodipine, hydrochlorothiazide, and metoprolol.  3. Hyperlipidemia. The patient takes Crestor 40 mg daily. He is followed by Dr. Laurance Flatten.  4. Carotid artery stenosis. He has chronic occlusion of the left internal carotid artery and moderate stenosis of the right internal carotid artery. He is due for a followup duplex scan.  [name] 06/16/2014 11:26 AM

## 2014-06-17 ENCOUNTER — Encounter: Payer: Self-pay | Admitting: Cardiovascular Disease

## 2014-07-05 ENCOUNTER — Telehealth: Payer: Self-pay | Admitting: Family Medicine

## 2014-07-05 DIAGNOSIS — F411 Generalized anxiety disorder: Secondary | ICD-10-CM

## 2014-07-05 MED ORDER — HYDROCODONE-ACETAMINOPHEN 10-325 MG PO TABS: 1.0000 | ORAL_TABLET | Freq: Four times a day (QID) | ORAL | Status: DC | PRN

## 2014-07-05 MED ORDER — ALPRAZOLAM 1 MG PO TABS: 1.0000 mg | ORAL_TABLET | Freq: Three times a day (TID) | ORAL | Status: DC | PRN

## 2014-07-05 NOTE — Telephone Encounter (Signed)
Both of these are okay refill x1

## 2014-07-05 NOTE — Telephone Encounter (Signed)
Printed and pt aware  

## 2014-07-10 ENCOUNTER — Other Ambulatory Visit (HOSPITAL_COMMUNITY): Payer: Self-pay | Admitting: *Deleted

## 2014-07-10 ENCOUNTER — Other Ambulatory Visit: Payer: Self-pay | Admitting: Family Medicine

## 2014-07-10 DIAGNOSIS — I6523 Occlusion and stenosis of bilateral carotid arteries: Secondary | ICD-10-CM

## 2014-07-11 ENCOUNTER — Ambulatory Visit (HOSPITAL_COMMUNITY): Payer: Medicare Other | Attending: Cardiology | Admitting: Radiology

## 2014-07-11 DIAGNOSIS — E785 Hyperlipidemia, unspecified: Secondary | ICD-10-CM | POA: Diagnosis not present

## 2014-07-11 DIAGNOSIS — I10 Essential (primary) hypertension: Secondary | ICD-10-CM | POA: Diagnosis not present

## 2014-07-11 DIAGNOSIS — Z87891 Personal history of nicotine dependence: Secondary | ICD-10-CM | POA: Insufficient documentation

## 2014-07-11 DIAGNOSIS — I6523 Occlusion and stenosis of bilateral carotid arteries: Secondary | ICD-10-CM | POA: Diagnosis not present

## 2014-07-11 DIAGNOSIS — I25810 Atherosclerosis of coronary artery bypass graft(s) without angina pectoris: Secondary | ICD-10-CM | POA: Diagnosis not present

## 2014-07-11 NOTE — Progress Notes (Signed)
Carotid Duplex performed.

## 2014-07-12 ENCOUNTER — Other Ambulatory Visit: Payer: Self-pay | Admitting: Cardiovascular Disease

## 2014-07-17 ENCOUNTER — Ambulatory Visit (INDEPENDENT_AMBULATORY_CARE_PROVIDER_SITE_OTHER): Payer: Medicare Other | Admitting: Family Medicine

## 2014-07-17 ENCOUNTER — Encounter: Payer: Self-pay | Admitting: Family Medicine

## 2014-07-17 VITALS — BP 125/50 | HR 51 | Temp 97.2°F | Ht 65.0 in | Wt 155.0 lb

## 2014-07-17 DIAGNOSIS — I10 Essential (primary) hypertension: Secondary | ICD-10-CM | POA: Diagnosis not present

## 2014-07-17 DIAGNOSIS — J701 Chronic and other pulmonary manifestations due to radiation: Secondary | ICD-10-CM | POA: Diagnosis not present

## 2014-07-17 DIAGNOSIS — E785 Hyperlipidemia, unspecified: Secondary | ICD-10-CM

## 2014-07-17 DIAGNOSIS — T451X5A Adverse effect of antineoplastic and immunosuppressive drugs, initial encounter: Secondary | ICD-10-CM

## 2014-07-17 DIAGNOSIS — Z85118 Personal history of other malignant neoplasm of bronchus and lung: Secondary | ICD-10-CM

## 2014-07-17 DIAGNOSIS — D696 Thrombocytopenia, unspecified: Secondary | ICD-10-CM | POA: Diagnosis not present

## 2014-07-17 DIAGNOSIS — Z23 Encounter for immunization: Secondary | ICD-10-CM

## 2014-07-17 DIAGNOSIS — G622 Polyneuropathy due to other toxic agents: Secondary | ICD-10-CM

## 2014-07-17 DIAGNOSIS — I25810 Atherosclerosis of coronary artery bypass graft(s) without angina pectoris: Secondary | ICD-10-CM

## 2014-07-17 DIAGNOSIS — N400 Enlarged prostate without lower urinary tract symptoms: Secondary | ICD-10-CM | POA: Diagnosis not present

## 2014-07-17 DIAGNOSIS — J301 Allergic rhinitis due to pollen: Secondary | ICD-10-CM

## 2014-07-17 DIAGNOSIS — I739 Peripheral vascular disease, unspecified: Secondary | ICD-10-CM | POA: Insufficient documentation

## 2014-07-17 DIAGNOSIS — G620 Drug-induced polyneuropathy: Secondary | ICD-10-CM | POA: Insufficient documentation

## 2014-07-17 LAB — POCT CBC
Granulocyte percent: 72.8 %G (ref 37–80)
HEMATOCRIT: 39.7 % — AB (ref 43.5–53.7)
Hemoglobin: 13.1 g/dL — AB (ref 14.1–18.1)
Lymph, poc: 1.2 (ref 0.6–3.4)
MCH, POC: 29 pg (ref 27–31.2)
MCHC: 33.1 g/dL (ref 31.8–35.4)
MCV: 87.8 fL (ref 80–97)
MPV: 10.1 fL (ref 0–99.8)
POC Granulocyte: 4 (ref 2–6.9)
POC LYMPH PERCENT: 21.5 %L (ref 10–50)
Platelet Count, POC: 127 10*3/uL — AB (ref 142–424)
RBC: 4.5 M/uL — AB (ref 4.69–6.13)
RDW, POC: 14.2 %
WBC: 5.5 10*3/uL (ref 4.6–10.2)

## 2014-07-17 NOTE — Addendum Note (Signed)
Addended by: Zannie Cove on: 07/17/2014 10:50 AM   Modules accepted: Orders

## 2014-07-17 NOTE — Progress Notes (Signed)
Subjective:    Patient ID: [name], male    DOB: [birth year], 68 y.o.   MRN: [id number]  HPI Pt here for follow up and management of chronic medical problems. The patient complains of sinus pressure and problems off and on. He is due for lab work, flu shot and Prevnar vaccines today. The patient also has thrombocytopenia. He was made aware that during the visit today. He is also scheduled for carotid surgery because of blockages in his neck. This is what he told me today. He is also scheduled for a repeat CT scan of his lungs this week by his radiation oncologist. He also describes a lack of sensation or a peripheral neuropathy in his lower extremities which comes from his chemotherapy. The patient comes to the visit today with his wife.        Patient Active Problem List   Diagnosis Date Noted  . History of lung cancer 03/09/2014  . BPH (benign prostatic hyperplasia) 03/09/2014  . Scabies 10/28/2013  . Rash and nonspecific skin eruption 10/28/2013  . Need for prophylactic vaccination and inoculation against influenza 09/24/2013  . Hyperlipemia 09/24/2013  . Ecchymosis 09/24/2013  . Mild eczema 09/24/2013  . Chronic pain syndrome 06/21/2013  . Anxiety state, unspecified 03/17/2013  . 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 THROMBOCYTOPENIA 12/04/2010  . FEMORAL BRUIT 12/04/2010  . HYPERTENSION, BENIGN 12/13/2008  . CAD, ARTERY BYPASS GRAFT 12/13/2008  . 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 HYPERLIPIDEMIA 10/16/2008  . HYPERTENSION 10/16/2008  . MYOCARDIAL INFARCTION 10/16/2008  . ALLERGIC RHINITIS 10/16/2008  . Swelling, mass, or lump in chest 10/16/2008   Outpatient Encounter Prescriptions as of 07/17/2014  Medication Sig  . ALPRAZolam (XANAX) 1 MG tablet Take 1 tablet (1 mg total) by mouth 3 (three) times daily as needed for anxiety.  Marland Kitchen amLODipine (NORVASC) 10 MG tablet TAKE 1 TABLET EVERY DAY  . aspirin EC 81 MG tablet Take  1 tablet (81 mg total) by mouth daily.  Marland Kitchen EFFIENT 10 MG TABS tablet TAKE 1 TABLET EVERY DAY  . fenofibrate 160 MG tablet Take 1 tablet (160 mg total) by mouth daily.  Marland Kitchen gabapentin (NEURONTIN) 300 MG capsule TAKE 1 CAPSULE (300 MG TOTAL) BY MOUTH EVERY 8 (EIGHT) HOURS.  . hydrochlorothiazide (MICROZIDE) 12.5 MG capsule TAKE 1 CAPSULE EVERY DAY  . HYDROcodone-acetaminophen (NORCO) 10-325 MG per tablet Take 1 tablet by mouth every 6 (six) hours as needed for moderate pain.  . isosorbide mononitrate (IMDUR) 30 MG 24 hr tablet Take 1 tablet (30 mg total) by mouth daily.  . metoprolol (LOPRESSOR) 50 MG tablet TAKE 1 TABLET (50 MG TOTAL) BY MOUTH 2 (TWO) TIMES DAILY.  . Multiple Vitamin (MULTIVITAMIN) capsule Take 1 capsule by mouth daily.    . nitroGLYCERIN (NITROSTAT) 0.4 MG SL tablet Place 0.4 mg under the tongue every 5 (five) minutes as needed. After 3 doses and no relief call 911  . potassium chloride SA (KLOR-CON M20) 20 MEQ tablet Take 1 tablet (20 mEq total) by mouth daily.  . rosuvastatin (CRESTOR) 40 MG tablet Take 1 tablet (40 mg total) by mouth daily.  . [DISCONTINUED] potassium chloride (KLOR-CON) 20 MEQ packet Take 20 mEq by mouth daily.    Review of Systems  Constitutional: Negative.   HENT: Positive for sinus pressure (off/ on ).   Eyes: Negative.   Respiratory: Negative.   Cardiovascular: Negative.   Gastrointestinal: Negative.   Endocrine: Negative.   Genitourinary: Negative.  Musculoskeletal: Negative.   Skin: Negative.   Allergic/Immunologic: Negative.   Neurological: Negative.   Hematological: Negative.   Psychiatric/Behavioral: Negative.        Objective:   Physical Exam  Nursing note and vitals reviewed. Constitutional: He is oriented to person, place, and time. He appears well-developed and well-nourished. No distress.  HENT:  Head: Normocephalic and atraumatic.  Right Ear: External ear normal.  Left Ear: External ear normal.  Mouth/Throat: Oropharynx is  clear and moist. No oropharyngeal exudate.  Mild nasal congestion and rhinitis  Eyes: Conjunctivae and EOM are normal. Pupils are equal, round, and reactive to light. Right eye exhibits no discharge. Left eye exhibits no discharge. No scleral icterus.  Neck: Normal range of motion. Neck supple. No thyromegaly present.  No bruits were audible  Cardiovascular: Normal rate, regular rhythm and normal heart sounds.  Exam reveals no gallop and no friction rub.   No murmur heard. At 60 per minute, distal pulses difficult to palpate  Pulmonary/Chest: Effort normal and breath sounds normal. No respiratory distress. He has no wheezes. He has no rales. He exhibits no tenderness.  Abdominal: Soft. Bowel sounds are normal. He exhibits no mass. There is no tenderness. There is no rebound and no guarding.  Musculoskeletal: Normal range of motion. He exhibits no edema and no tenderness.  Lymphadenopathy:    He has no cervical adenopathy.  Neurological: He is alert and oriented to person, place, and time. He has normal reflexes.  Skin: Skin is warm and dry. No rash noted. No erythema. No pallor.  Psychiatric: He has a normal mood and affect. His behavior is normal. Judgment and thought content normal.   BP 125/50  Pulse 51  Temp(Src) 97.2 F (36.2 C) (Oral)  Ht 5' 5"  (1.651 m)  Wt 155 lb (70.308 kg)  BMI 25.79 kg/m2        Assessment & Plan:  1. BPH (benign prostatic hyperplasia) - POCT CBC  2. History of lung cancer - POCT CBC  3. Hyperlipemia - POCT CBC - NMR, lipoprofile  4. HYPERTENSION, BENIGN - POCT CBC - BMP8+EGFR - Hepatic function panel  5. Radiation fibrosis of lung  6. 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 Peripheral vascular insufficiency  9. Allergic rhinitis due to pollen  Patient Instructions                       Medicare Annual Wellness Visit  [facility] and the medical providers at [facility] strive to  bring you the best medical care.  In doing so we not only want to address your current medical conditions and concerns but also to detect new conditions early and prevent illness, disease and health-related problems.    Medicare offers a yearly Wellness Visit which allows our clinical staff to assess your need for preventative services including immunizations, lifestyle education, counseling to decrease risk of preventable diseases and screening for fall risk and other medical concerns.    This visit is provided free of charge (no copay) for all Medicare recipients. The clinical pharmacists at [facility] have begun to conduct these Wellness Visits which will also include a thorough review of all your medications.    As you primary medical provider recommend that you make an appointment for your Annual Wellness Visit if you have not done so already this year.  You may set up this appointment before you leave today or you may call back (  [phone]) and schedule an appointment.  Please make sure when you call that you mention that you are scheduling your Annual Wellness Visit with the clinical pharmacist so that the appointment may be made for the proper length of time.     Continue current medications. Continue good therapeutic lifestyle changes which include good diet and exercise. Fall precautions discussed with patient. If an FOBT was given today- please return it to our front desk. If you are over 25 years old - you may need Prevnar 81 or the adult Pneumonia vaccine.  Flu Shots will be available at our office starting mid- September. Please call and schedule a FLU CLINIC APPOINTMENT.   Continue with followup for carotid stenosis Continue with followup with the radiation oncologist for CT scan Continue followup with cardiology Flonase over-the-counter, 1-2 sprays each nostril daily at bedtime--- will help allergic rhinitis   Arrie Senate MD

## 2014-07-17 NOTE — Patient Instructions (Addendum)
Medicare Annual Wellness Visit  [facility] and the medical providers at [facility] strive to bring you the best medical care.  In doing so we not only want to address your current medical conditions and concerns but also to detect new conditions early and prevent illness, disease and health-related problems.    Medicare offers a yearly Wellness Visit which allows our clinical staff to assess your need for preventative services including immunizations, lifestyle education, counseling to decrease risk of preventable diseases and screening for fall risk and other medical concerns.    This visit is provided free of charge (no copay) for all Medicare recipients. The clinical pharmacists at [facility] have begun to conduct these Wellness Visits which will also include a thorough review of all your medications.    As you primary medical provider recommend that you make an appointment for your Annual Wellness Visit if you have not done so already this year.  You may set up this appointment before you leave today or you may call back ([phone]) and schedule an appointment.  Please make sure when you call that you mention that you are scheduling your Annual Wellness Visit with the clinical pharmacist so that the appointment may be made for the proper length of time.     Continue current medications. Continue good therapeutic lifestyle changes which include good diet and exercise. Fall precautions discussed with patient. If an FOBT was given today- please return it to our front desk. If you are over 80 years old - you may need Prevnar 46 or the adult Pneumonia vaccine.  Flu Shots will be available at our office starting mid- September. Please call and schedule a FLU CLINIC APPOINTMENT.   Continue with followup for carotid stenosis Continue with followup with the radiation oncologist for CT scan Continue followup with cardiology Flonase  over-the-counter, 1-2 sprays each nostril daily at bedtime--- will help allergic rhinitis

## 2014-07-18 LAB — NMR, LIPOPROFILE
CHOLESTEROL: 140 mg/dL (ref 100–199)
HDL Cholesterol by NMR: 36 mg/dL — ABNORMAL LOW (ref >39)
HDL PARTICLE NUMBER: 36.7 umol/L (ref >=30.5)
LDL Particle Number: 1171 nmol/L — ABNORMAL HIGH (ref <1000)
LDL SIZE: 20.5 nm (ref >20.5)
LDLC SERPL CALC-MCNC: 72 mg/dL (ref 0–99)
LP-IR SCORE: 76 — AB (ref <=45)
SMALL LDL PARTICLE NUMBER: 682 nmol/L — AB (ref <=527)
Triglycerides by NMR: 161 mg/dL — ABNORMAL HIGH (ref 0–149)

## 2014-07-18 LAB — HEPATIC FUNCTION PANEL
ALK PHOS: 47 IU/L (ref 39–117)
ALT: 26 IU/L (ref 0–44)
AST: 16 IU/L (ref 0–40)
Albumin: 4.7 g/dL (ref 3.6–4.8)
BILIRUBIN DIRECT: 0.14 mg/dL (ref 0.00–0.40)
BILIRUBIN TOTAL: 0.5 mg/dL (ref 0.0–1.2)
TOTAL PROTEIN: 6.5 g/dL (ref 6.0–8.5)

## 2014-07-18 LAB — BMP8+EGFR
BUN/Creatinine Ratio: 10 (ref 10–22)
BUN: 10 mg/dL (ref 8–27)
CO2: 21 mmol/L (ref 18–29)
Calcium: 9.3 mg/dL (ref 8.6–10.2)
Chloride: 105 mmol/L (ref 97–108)
Creatinine, Ser: 0.98 mg/dL (ref 0.76–1.27)
GFR, EST AFRICAN AMERICAN: 91 mL/min/{1.73_m2} (ref >59)
GFR, EST NON AFRICAN AMERICAN: 79 mL/min/{1.73_m2} (ref >59)
Glucose: 94 mg/dL (ref 65–99)
POTASSIUM: 4.3 mmol/L (ref 3.5–5.2)
Sodium: 143 mmol/L (ref 134–144)

## 2014-07-20 ENCOUNTER — Ambulatory Visit (HOSPITAL_COMMUNITY)
Admission: RE | Admit: 2014-07-20 | Discharge: 2014-07-20 | Disposition: A | Discharge status: Home / Self Care | Payer: Medicare Other | Source: Ambulatory Visit | Attending: Radiation Oncology | Admitting: Radiation Oncology

## 2014-07-20 ENCOUNTER — Encounter: Payer: Self-pay | Admitting: Radiation Oncology

## 2014-07-20 ENCOUNTER — Ambulatory Visit
Admission: RE | Admit: 2014-07-20 | Discharge: 2014-07-20 | Disposition: A | Discharge status: Home / Self Care | Payer: Medicare Other | Source: Ambulatory Visit | Attending: Radiation Oncology | Admitting: Radiation Oncology

## 2014-07-20 VITALS — BP 134/45 | HR 63 | Temp 98.2°F | Resp 20 | Ht 65.0 in | Wt 155.9 lb

## 2014-07-20 DIAGNOSIS — C3411 Malignant neoplasm of upper lobe, right bronchus or lung: Secondary | ICD-10-CM | POA: Insufficient documentation

## 2014-07-20 DIAGNOSIS — J432 Centrilobular emphysema: Secondary | ICD-10-CM | POA: Diagnosis not present

## 2014-07-20 DIAGNOSIS — C3410 Malignant neoplasm of upper lobe, unspecified bronchus or lung: Secondary | ICD-10-CM | POA: Diagnosis not present

## 2014-07-20 DIAGNOSIS — C3491 Malignant neoplasm of unspecified part of right bronchus or lung: Secondary | ICD-10-CM | POA: Diagnosis not present

## 2014-07-20 NOTE — Progress Notes (Addendum)
Routine follow up radiation treatment to right upper lung  In May 2010.Denies pain.New medication for heart (isorsibide).Denies shortness of breath of cough.Ct chest 07/20/14 is stable.

## 2014-07-21 NOTE — Progress Notes (Signed)
Department of Radiation Oncology  Phone:  [phone] Fax:        [phone]   Name: [name] MRN: [id number]  DOB: October 01, 1946  Date: 07/20/2014  Follow Up Visit Note  Diagnosis: T2N0 Adenocarcinoma of the right upper lobe  Summary and Interval since last radiation: 5 years from 74 Gy completed May 2010 (chemo held after MI)  Interval History: [name] presents today for routine followup.  He is feeling well and doing well. He had a cath and "some vessels opened up" by Dr. Burt Knack and that has decreased his chest pain. He still has pain from his neuropathy in his feet. Neurontin and hydrocodone relieve this most of the time.  CT from 10/15 shows stable treatment change in the right upper lobe and no evidence of lymphadenopathy.     Allergies:  Allergies  Allergen Reactions  . Demerol Nausea And Vomiting  . Clopidogrel Bisulfate     Severe rash  . Meperidine Hcl   . Penicillins     Rash   . Propoxyphene N-Acetaminophen     nauesa    Medications:  Current Outpatient Prescriptions  Medication Sig Dispense Refill  . ALPRAZolam (XANAX) 1 MG tablet Take 1 tablet (1 mg total) by mouth 3 (three) times daily as needed for anxiety.  90 tablet  1  . amLODipine (NORVASC) 10 MG tablet TAKE 1 TABLET EVERY DAY  30 tablet  5  . aspirin EC 81 MG tablet Take 1 tablet (81 mg total) by mouth daily.  30 tablet  4  . EFFIENT 10 MG TABS tablet TAKE 1 TABLET EVERY DAY  30 tablet  2  . fenofibrate 160 MG tablet Take 1 tablet (160 mg total) by mouth daily.  30 tablet  5  . gabapentin (NEURONTIN) 300 MG capsule TAKE 1 CAPSULE (300 MG TOTAL) BY MOUTH EVERY 8 (EIGHT) HOURS.  90 capsule  2  . hydrochlorothiazide (MICROZIDE) 12.5 MG capsule TAKE 1 CAPSULE EVERY DAY  30 capsule  2  . HYDROcodone-acetaminophen (NORCO) 10-325 MG per tablet Take 1 tablet by mouth every 6 (six) hours as needed for moderate pain.  120 tablet  0  . isosorbide dinitrate (ISORDIL) 30 MG tablet Take 30 mg by mouth 4  (four) times daily.      . isosorbide mononitrate (IMDUR) 30 MG 24 hr tablet Take 1 tablet (30 mg total) by mouth daily.  90 tablet  3  . metoprolol (LOPRESSOR) 50 MG tablet TAKE 1 TABLET (50 MG TOTAL) BY MOUTH 2 (TWO) TIMES DAILY.  180 tablet  0  . Multiple Vitamin (MULTIVITAMIN) capsule Take 1 capsule by mouth daily.        . nitroGLYCERIN (NITROSTAT) 0.4 MG SL tablet Place 0.4 mg under the tongue every 5 (five) minutes as needed. After 3 doses and no relief call 911      . potassium chloride SA (KLOR-CON M20) 20 MEQ tablet Take 1 tablet (20 mEq total) by mouth daily.  30 tablet  11  . rosuvastatin (CRESTOR) 40 MG tablet Take 1 tablet (40 mg total) by mouth daily.  30 tablet  5   Current Facility-Administered Medications  Medication Dose Route Frequency Provider Last Rate Last Dose  . cyanocobalamin ((VITAMIN B-12)) injection 1,000 mcg  1,000 mcg Intramuscular Q30 days Lysbeth Penner, FNP   1,000 mcg at 05/29/14 1046    Physical Exam:  Filed Vitals:   07/20/14 1505  BP: 134/45  Pulse: 63  Temp: 98.2 F (36.8 C)  Resp: 20   No oxygen. No respiratory distress. ECOG 1.   IMPRESSION: [name] is a 68 y.o. male s/p RT alone for stage II Lung cancer with NED at 5 years out from treatment  PLAN:  Looks great. Scan shows NED. Follow up in 1 year with non contrast CT per NCCN guidelines. We agreed to follow him yearly with non contrast Cts at his request.     Thea Silversmith, MD

## 2014-07-22 ENCOUNTER — Other Ambulatory Visit: Payer: Self-pay | Admitting: Family Medicine

## 2014-07-24 ENCOUNTER — Ambulatory Visit (INDEPENDENT_AMBULATORY_CARE_PROVIDER_SITE_OTHER): Payer: Medicare Other | Admitting: *Deleted

## 2014-07-24 DIAGNOSIS — E538 Deficiency of other specified B group vitamins: Secondary | ICD-10-CM | POA: Diagnosis not present

## 2014-07-24 NOTE — Patient Instructions (Signed)
Vitamin B12 Injections Every person needs vitamin B12. A deficiency develops when the body does not get enough of it. One way to overcome this is by getting B12 shots (injections). A B12 shot puts the vitamin directly into muscle tissue. This avoids any problems your body might have in absorbing it from food or a pill. In some people, the body has trouble using the vitamin correctly. This can cause a B12 deficiency. Not consuming enough of the vitamin can also cause a deficiency. Getting enough vitamin B12 can be hard for elderly people. Sometimes, they do not eat a well-balanced diet. The elderly are also more likely than younger people to have medical conditions or take medications that can lead to a deficiency. WHAT DOES VITAMIN B12 DO? Vitamin B12 does many things to help the body work right:  It helps the body make healthy red blood cells.  It helps maintain nerve cells.  It is involved in the body's process of converting food into energy (metabolism).  It is needed to make the genetic material in all cells (DNA). VITAMIN B12 FOOD SOURCES Most people get plenty of vitamin B12 through the foods they eat. It is present in:  Meat, fish, poultry, and eggs.  Milk and milk products.  It also is added when certain foods are made, including some breads, cereals and yogurts. The food is then called "fortified". CAUSES The most common causes of vitamin B12 deficiency are:  Pernicious anemia. The condition develops when the body cannot make enough healthy red blood cells. This stems from a lack of a protein made in the stomach (intrinsic factor). People without this protein cannot absorb enough vitamin B12 from food.  Malabsorption. This is when the body cannot absorb the vitamin. It can be caused by:  Pernicious anemia.  Surgery to remove part or all of the stomach can lead to malabsorption. Removal of part or all of the small intestine can also cause malabsorption.  Vegetarian diet.  People who are strict about not eating foods from animals could have trouble taking in enough vitamin B12 from diet alone.  Medications. Some medicines have been linked to B12 deficiency, such as Metformin (a drug prescribed for type 2 diabetes). Long-term use of stomach acid suppressants also can keep the vitamin from being absorbed.  Intestinal problems such as inflammatory bowel disease. If there are problems in the digestive tract, vitamin B12 may not be absorbed in good enough amounts. SYMPTOMS People who do not get enough B12 can develop problems. These can include:  Anemia. This is when the body has too few red blood cells. Red blood cells carry oxygen to the rest of the body. Without a healthy supply of red blood cells, people can feel:  Tired (fatigued).  Weak.  Severe anemia can cause:  Shortness of breath.  Dizziness.  Rapid heart rate.  Paleness.  Other Vitamin B12 deficiency symptoms include:  Diarrhea.  Numbness or tingling in the hands or feet.  Loss of appetite.  Confusion.  Sores on the tongue or in the mouth. LET YOUR CAREGIVER KNOW ABOUT:  Any allergies. It is very important to know if you are allergic or sensitive to cobalt. Vitamin B12 contains cobalt.  Any history of kidney disease.  All medications you are taking. Include prescription and over-the-counter medicines, herbs and creams.  Whether you are pregnant or breast-feeding.  If you have Leber's disease, a hereditary eye condition, vitamin B12 could make it worse. RISKS AND COMPLICATIONS Reactions to an injection are   usually temporary. They might include:  Pain at the injection site.  Redness, swelling or tenderness at the site.  Headache, dizziness or weakness.  Nausea, upset stomach or diarrhea.  Numbness or tingling.  Fever.  Joint pain.  Itching or rash. If a reaction does not go away in a short while, talk with your healthcare provider. A change in the way the shots are  given, or where they are given, might need to be made. BEFORE AN INJECTION To decide whether B12 injections are right for you, your healthcare provider will probably:  Ask about your medical history.  Ask questions about your diet.  Ask about symptoms such as:  Have you felt weak?  Do you feel unusually tired?  Do you get dizzy?  Order blood tests. These may include a test to:  Check the level of red cells in your blood.  Measure B12 levels.  Check for the presence of intrinsic factor. VITAMIN B12 INJECTIONS How often you will need a vitamin B12 injection will depend on how severe your deficiency is. This also will affect how long you will need to get them. People with pernicious anemia usually get injections for their entire life. Others might get them for a shorter period. For many people, injections are given daily or weekly for several weeks. Then, once B12 levels are normal, injections are given just once a month. If the cause of the deficiency can be fixed, the injections can be stopped. Talk with your healthcare provider about what you should expect. For an injection:  The injection site will be cleaned with an alcohol swab.  Your healthcare provider will insert a needle directly into a muscle. Most any muscle can be used. Most often, an arm muscle is used. A buttocks muscle can also be used. Many people say shots in that area are less painful.  A small adhesive bandage may be put over the injection site. It usually can be taken off in an hour or less. Injections can be given by your healthcare provider. In some cases, family members give them. Sometimes, people give them to themselves. Talk with your healthcare provider about what would be best for you. If someone other than your healthcare provider will be giving the shots, the person will need to be trained to give them correctly. HOME CARE INSTRUCTIONS   You can remove the adhesive bandage within an hour of getting a  shot.  You should be able to go about your normal activities right away.  Avoid drinking large amounts of alcohol while taking vitamin B12 shots. Alcohol can interfere with the body's use of the vitamin. SEEK MEDICAL CARE IF:   Pain, redness, swelling or tenderness at the injection site does not get better or gets worse.  Headache, dizziness or weakness does not go away.  You develop a fever of more than 100.5 F (38.1 C). SEEK IMMEDIATE MEDICAL CARE IF:   You have chest pain.  You develop shortness of breath.  You have muscle weakness that gets worse.  You develop numbness, weakness or tingling on one side or one area of the body.  You have symptoms of an allergic reaction, such as:  Hives.  Difficulty breathing.  Swelling of the lips, face, tongue or throat.  You develop a fever of more than 102.0 F (38.9 C). MAKE SURE YOU:   Understand these instructions.  Will watch your condition.  Will get help right away if you are not doing well or get worse. Document   Released: 12/19/2008 Document Revised: 12/15/2011 Document Reviewed: 12/19/2008 ExitCare Patient Information 2015 [employer]. This information is not intended to replace advice given to you by your health care provider. Make sure you discuss any questions you have with your health care provider.  

## 2014-07-24 NOTE — Progress Notes (Signed)
Patient ID: [name], male   DOB: [birth year], 68 y.o.   MRN: [id number] Vitamin b12 given and tolerated well

## 2014-07-27 ENCOUNTER — Ambulatory Visit: Payer: Medicare Other | Admitting: Radiation Oncology

## 2014-07-31 ENCOUNTER — Telehealth: Payer: Self-pay | Admitting: Family Medicine

## 2014-07-31 NOTE — Telephone Encounter (Signed)
Appt given tomorrow per patients request

## 2014-08-01 ENCOUNTER — Telehealth: Payer: Self-pay | Admitting: Family Medicine

## 2014-08-01 ENCOUNTER — Encounter: Payer: Self-pay | Admitting: Family Medicine

## 2014-08-01 ENCOUNTER — Ambulatory Visit (INDEPENDENT_AMBULATORY_CARE_PROVIDER_SITE_OTHER): Payer: Medicare Other | Admitting: Family Medicine

## 2014-08-01 VITALS — BP 120/57 | HR 70 | Temp 97.1°F | Ht 65.0 in | Wt 156.0 lb

## 2014-08-01 DIAGNOSIS — R319 Hematuria, unspecified: Secondary | ICD-10-CM | POA: Diagnosis not present

## 2014-08-01 DIAGNOSIS — I25810 Atherosclerosis of coronary artery bypass graft(s) without angina pectoris: Secondary | ICD-10-CM

## 2014-08-01 DIAGNOSIS — L0292 Furuncle, unspecified: Secondary | ICD-10-CM | POA: Diagnosis not present

## 2014-08-01 DIAGNOSIS — F411 Generalized anxiety disorder: Secondary | ICD-10-CM

## 2014-08-01 MED ORDER — DOXYCYCLINE HYCLATE 100 MG PO TABS: 100.0000 mg | ORAL_TABLET | Freq: Two times a day (BID) | ORAL | Status: DC

## 2014-08-01 MED ORDER — HYDROCODONE-ACETAMINOPHEN 10-325 MG PO TABS: 1.0000 | ORAL_TABLET | Freq: Four times a day (QID) | ORAL | Status: DC | PRN

## 2014-08-01 MED ORDER — ALPRAZOLAM 1 MG PO TABS: 1.0000 mg | ORAL_TABLET | Freq: Three times a day (TID) | ORAL | Status: DC | PRN

## 2014-08-01 NOTE — Telephone Encounter (Signed)
Patient aware.

## 2014-08-01 NOTE — Progress Notes (Signed)
   Subjective:    Patient ID: [name], male    DOB: [birth year], 68 y.o.   MRN: [id number]  HPI  This 68 y.o. male presents for evaluation of furuncle right cheek.  Review of Systems No chest pain, SOB, HA, dizziness, vision change, N/V, diarrhea, constipation, dysuria, urinary urgency or frequency, myalgias, arthralgias or rash.     Objective:   Physical Exam  Vital signs noted  Well developed well nourished male.  HEENT - Head atraumatic Normocephalic Respiratory - Lungs CTA bilateral Cardiac - RRR S1 and S2 without murmur Skin - Furuncle right cheek w/o fluctuance     Assessment & Plan:   Furuncle - Plan: doxycycline (VIBRA-TABS) 100 MG tablet po bid x 14 days  Lysbeth Penner FNP

## 2014-08-01 NOTE — Telephone Encounter (Signed)
These prescriptions are probably okay. Does make sure that he has been in to be seen in the past 2-3 months.

## 2014-08-19 ENCOUNTER — Other Ambulatory Visit: Payer: Self-pay | Admitting: Cardiovascular Disease

## 2014-08-22 ENCOUNTER — Telehealth: Payer: Self-pay | Admitting: Family Medicine

## 2014-08-23 NOTE — Telephone Encounter (Signed)
Pt needs to be seen for cyst App scheduled

## 2014-08-25 ENCOUNTER — Encounter: Payer: Self-pay | Admitting: Family Medicine

## 2014-08-25 ENCOUNTER — Ambulatory Visit (INDEPENDENT_AMBULATORY_CARE_PROVIDER_SITE_OTHER): Payer: Medicare Other | Admitting: Family Medicine

## 2014-08-25 ENCOUNTER — Ambulatory Visit (INDEPENDENT_AMBULATORY_CARE_PROVIDER_SITE_OTHER): Payer: Medicare Other | Admitting: *Deleted

## 2014-08-25 VITALS — BP 105/52 | HR 62 | Temp 97.4°F | Ht 65.0 in | Wt 155.8 lb

## 2014-08-25 DIAGNOSIS — R5383 Other fatigue: Secondary | ICD-10-CM | POA: Diagnosis not present

## 2014-08-25 DIAGNOSIS — L0292 Furuncle, unspecified: Secondary | ICD-10-CM

## 2014-08-25 DIAGNOSIS — I25810 Atherosclerosis of coronary artery bypass graft(s) without angina pectoris: Secondary | ICD-10-CM | POA: Diagnosis not present

## 2014-08-25 DIAGNOSIS — E538 Deficiency of other specified B group vitamins: Secondary | ICD-10-CM | POA: Diagnosis not present

## 2014-08-25 MED ORDER — DOXYCYCLINE HYCLATE 100 MG PO TABS: 100.0000 mg | ORAL_TABLET | Freq: Two times a day (BID) | ORAL | Status: DC

## 2014-08-25 NOTE — Patient Instructions (Signed)

## 2014-08-25 NOTE — Progress Notes (Signed)
   Subjective:    Patient ID: [name], male    DOB: [birth year], 68 y.o.   MRN: [id number]  HPI Patient is here for follow up on right facial furuncle and it is just about gone according to patient.  Review of Systems  Constitutional: Negative for fever.  HENT: Negative for ear pain.   Eyes: Negative for discharge.  Respiratory: Negative for cough.   Cardiovascular: Negative for chest pain.  Gastrointestinal: Negative for abdominal distention.  Endocrine: Negative for polyuria.  Genitourinary: Negative for difficulty urinating.  Musculoskeletal: Negative for gait problem and neck pain.  Skin: Negative for color change and rash.  Neurological: Negative for speech difficulty and headaches.  Psychiatric/Behavioral: Negative for agitation.       Objective:    BP 105/52 mmHg  Pulse 62  Temp(Src) 97.4 F (36.3 C) (Oral)  Ht 5\' 5"  (1.651 m)  Wt 155 lb 12.8 oz (70.67 kg)  BMI 25.93 kg/m2 Physical Exam        Assessment & Plan:     ICD-9-CM ICD-10-CM   1. Furuncle 680.9 L02.92 doxycycline (VIBRA-TABS) 100 MG tablet     No Follow-up on file.  Lysbeth Penner FNP

## 2014-08-25 NOTE — Progress Notes (Signed)
Pt given B12 injection IM left deltoid, pt tolerated injection well.

## 2014-09-04 ENCOUNTER — Telehealth: Payer: Self-pay | Admitting: Family Medicine

## 2014-09-04 NOTE — Telephone Encounter (Signed)
This is okay to refill 1 

## 2014-09-05 ENCOUNTER — Other Ambulatory Visit: Payer: Self-pay | Admitting: *Deleted

## 2014-09-05 MED ORDER — HYDROCODONE-ACETAMINOPHEN 10-325 MG PO TABS: 1.0000 | ORAL_TABLET | Freq: Four times a day (QID) | ORAL | Status: DC | PRN

## 2014-09-05 NOTE — Progress Notes (Signed)
Med okayed per [facility] x 1 Rx printed

## 2014-09-05 NOTE — Telephone Encounter (Signed)
Pt has picked up rx

## 2014-09-14 ENCOUNTER — Encounter (HOSPITAL_COMMUNITY): Payer: Self-pay | Admitting: Cardiovascular Disease

## 2014-09-16 ENCOUNTER — Other Ambulatory Visit: Payer: Self-pay | Admitting: Family Medicine

## 2014-09-16 ENCOUNTER — Other Ambulatory Visit: Payer: Self-pay | Admitting: Cardiovascular Disease

## 2014-09-17 ENCOUNTER — Other Ambulatory Visit: Payer: Self-pay | Admitting: Family Medicine

## 2014-09-19 ENCOUNTER — Other Ambulatory Visit: Payer: Self-pay

## 2014-09-19 MED ORDER — GABAPENTIN 300 MG PO CAPS: ORAL_CAPSULE | ORAL | Status: DC

## 2014-09-25 ENCOUNTER — Ambulatory Visit (INDEPENDENT_AMBULATORY_CARE_PROVIDER_SITE_OTHER): Payer: Medicare Other | Admitting: *Deleted

## 2014-09-25 DIAGNOSIS — E538 Deficiency of other specified B group vitamins: Secondary | ICD-10-CM | POA: Diagnosis not present

## 2014-09-25 NOTE — Patient Instructions (Signed)

## 2014-09-25 NOTE — Progress Notes (Signed)
Vitamin b12 injection given and tolerated well.  

## 2014-09-26 ENCOUNTER — Telehealth: Payer: Self-pay | Admitting: Family Medicine

## 2014-09-26 DIAGNOSIS — F411 Generalized anxiety disorder: Secondary | ICD-10-CM

## 2014-09-26 MED ORDER — ALPRAZOLAM 1 MG PO TABS: 1.0000 mg | ORAL_TABLET | Freq: Three times a day (TID) | ORAL | Status: DC | PRN

## 2014-09-26 NOTE — Telephone Encounter (Signed)
Xanax will be phoned in and I will hold this message as a reminder for next week - to rf pain meds then. Pt aware

## 2014-09-26 NOTE — Telephone Encounter (Signed)
Please refill these at their due dates

## 2014-10-03 MED ORDER — HYDROCODONE-ACETAMINOPHEN 10-325 MG PO TABS: 1.0000 | ORAL_TABLET | Freq: Four times a day (QID) | ORAL | Status: DC | PRN

## 2014-10-03 NOTE — Telephone Encounter (Signed)
hydrocod - refilled today  Pt will be contacted to come by and pick up

## 2014-10-26 ENCOUNTER — Ambulatory Visit (INDEPENDENT_AMBULATORY_CARE_PROVIDER_SITE_OTHER): Payer: Medicare Other | Admitting: *Deleted

## 2014-10-26 DIAGNOSIS — E538 Deficiency of other specified B group vitamins: Secondary | ICD-10-CM | POA: Diagnosis not present

## 2014-10-26 NOTE — Progress Notes (Signed)
Vitamin b12 given and tolerated well.

## 2014-10-26 NOTE — Patient Instructions (Signed)

## 2014-10-31 ENCOUNTER — Other Ambulatory Visit: Payer: Self-pay | Admitting: Family Medicine

## 2014-10-31 MED ORDER — HYDROCODONE-ACETAMINOPHEN 10-325 MG PO TABS: 1.0000 | ORAL_TABLET | Freq: Four times a day (QID) | ORAL | Status: DC | PRN

## 2014-10-31 NOTE — Telephone Encounter (Signed)
This is okay to refill 1. The patient needs to make an appointment with a provider to discuss his pain medication and his continued need for this.

## 2014-11-01 NOTE — Telephone Encounter (Signed)
Left message refill ready to be picked up at office

## 2014-11-23 ENCOUNTER — Other Ambulatory Visit: Payer: Self-pay | Admitting: Cardiovascular Disease

## 2014-11-23 ENCOUNTER — Ambulatory Visit (INDEPENDENT_AMBULATORY_CARE_PROVIDER_SITE_OTHER): Payer: Medicare Other | Admitting: Family Medicine

## 2014-11-23 ENCOUNTER — Encounter: Payer: Self-pay | Admitting: Family Medicine

## 2014-11-23 VITALS — BP 137/63 | HR 69 | Temp 97.1°F | Ht 65.0 in | Wt 152.0 lb

## 2014-11-23 DIAGNOSIS — I739 Peripheral vascular disease, unspecified: Secondary | ICD-10-CM

## 2014-11-23 DIAGNOSIS — N400 Enlarged prostate without lower urinary tract symptoms: Secondary | ICD-10-CM

## 2014-11-23 DIAGNOSIS — R0989 Other specified symptoms and signs involving the circulatory and respiratory systems: Secondary | ICD-10-CM | POA: Diagnosis not present

## 2014-11-23 DIAGNOSIS — F411 Generalized anxiety disorder: Secondary | ICD-10-CM | POA: Diagnosis not present

## 2014-11-23 DIAGNOSIS — E785 Hyperlipidemia, unspecified: Secondary | ICD-10-CM

## 2014-11-23 DIAGNOSIS — E538 Deficiency of other specified B group vitamins: Secondary | ICD-10-CM

## 2014-11-23 DIAGNOSIS — D696 Thrombocytopenia, unspecified: Secondary | ICD-10-CM | POA: Diagnosis not present

## 2014-11-23 DIAGNOSIS — E559 Vitamin D deficiency, unspecified: Secondary | ICD-10-CM | POA: Diagnosis not present

## 2014-11-23 DIAGNOSIS — I10 Essential (primary) hypertension: Secondary | ICD-10-CM | POA: Diagnosis not present

## 2014-11-23 DIAGNOSIS — Z85118 Personal history of other malignant neoplasm of bronchus and lung: Secondary | ICD-10-CM | POA: Diagnosis not present

## 2014-11-23 MED ORDER — HYDROCODONE-ACETAMINOPHEN 10-325 MG PO TABS: 1.0000 | ORAL_TABLET | Freq: Four times a day (QID) | ORAL | Status: DC | PRN

## 2014-11-23 MED ORDER — ALPRAZOLAM 1 MG PO TABS: 1.0000 mg | ORAL_TABLET | Freq: Three times a day (TID) | ORAL | Status: DC | PRN

## 2014-11-23 NOTE — Addendum Note (Signed)
Addended by: Earlene Plater on: 11/23/2014 10:14 AM   Modules accepted: Orders

## 2014-11-23 NOTE — Progress Notes (Signed)
Subjective:    Patient ID: [name], male    DOB: [birth year], 69 y.o.   MRN: [id number]  HPI Pt here for follow up and management of chronic medical problems which includes hypertension and hyperlipidemia. He is taking medications regularly. The patient comes to the visit today with his wife. He is followed regularly by his cardiologist Dr. Burt Knack and he should seek sometime in the next couple of months. He is also followed yearly by his radiation oncologist, Dr. Pablo Ledger. The patient has no specific complaints other than some arthralgias which he has all the time. He does say that when he is walking his legs hurting has to stop and rest and this apparently is not getting any worse. He does exercise some but not as much as he should according to his wife. He indicates that the cardiologist is aware of his circulation issues in his neck and lower extremities and has done Dopplers in the past. He is due to get lab work today and is requesting refills on a couple of his medications. Patient denies any bleeding issues. He also denies chest pain or shortness of breath.      Patient Active Problem List   Diagnosis Date Noted  . Thrombocytopenia 07/17/2014  . Radiation fibrosis of lung 07/17/2014  . Peripheral vascular insufficiency 07/17/2014  . 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 Allergic rhinitis due to pollen 07/17/2014  . Adenocarcinoma of the right upper lobe 03/09/2014  . BPH (benign prostatic hyperplasia) 03/09/2014  . Scabies 10/28/2013  . Rash and nonspecific skin eruption 10/28/2013  . Need for prophylactic vaccination and inoculation against influenza 09/24/2013  . Hyperlipemia 09/24/2013  . Ecchymosis 09/24/2013  . Mild eczema 09/24/2013  . Chronic pain syndrome 06/21/2013  . Anxiety state, unspecified 03/17/2013  . Malignant neoplasm of upper lobe of right lung 07/23/2012  . THROMBOCYTOPENIA 12/04/2010  . FEMORAL BRUIT 12/04/2010  . HYPERTENSION,  BENIGN 12/13/2008  . CAD, ARTERY BYPASS GRAFT 12/13/2008  . 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 HYPERLIPIDEMIA 10/16/2008  . MYOCARDIAL INFARCTION 10/16/2008  . ALLERGIC RHINITIS 10/16/2008  . Swelling, mass, or lump in chest 10/16/2008   Outpatient Encounter Prescriptions as of 11/23/2014  Medication Sig  . ALPRAZolam (XANAX) 1 MG tablet Take 1 tablet (1 mg total) by mouth 3 (three) times daily as needed for anxiety.  Marland Kitchen amLODipine (NORVASC) 10 MG tablet TAKE 1 TABLET EVERY DAY  . aspirin EC 81 MG tablet Take 1 tablet (81 mg total) by mouth daily.  Marland Kitchen EFFIENT 10 MG TABS tablet TAKE 1 TABLET EVERY DAY  . fenofibrate 160 MG tablet TAKE 1 TABLET (160 MG TOTAL) BY MOUTH DAILY.  Marland Kitchen gabapentin (NEURONTIN) 300 MG capsule TAKE 1 CAPSULE (300 MG TOTAL) BY MOUTH EVERY 8 (EIGHT) HOURS.  . hydrochlorothiazide (MICROZIDE) 12.5 MG capsule TAKE 1 CAPSULE EVERY DAY  . HYDROcodone-acetaminophen (NORCO) 10-325 MG per tablet Take 1 tablet by mouth every 6 (six) hours as needed for moderate pain.  . isosorbide dinitrate (ISORDIL) 30 MG tablet Take 30 mg by mouth 4 (four) times daily.  . isosorbide mononitrate (IMDUR) 30 MG 24 hr tablet Take 1 tablet (30 mg total) by mouth daily.  . metoprolol (LOPRESSOR) 50 MG tablet TAKE 1 TABLET (50 MG TOTAL) BY MOUTH 2 (TWO) TIMES DAILY.  . Multiple Vitamin (MULTIVITAMIN) capsule Take 1 capsule by mouth daily.    . potassium chloride SA (KLOR-CON M20) 20 MEQ tablet Take 1 tablet (20 mEq total) by  mouth daily.  . rosuvastatin (CRESTOR) 40 MG tablet Take 1 tablet (40 mg total) by mouth daily.  . nitroGLYCERIN (NITROSTAT) 0.4 MG SL tablet Place 0.4 mg under the tongue every 5 (five) minutes as needed. After 3 doses and no relief call 911  . [DISCONTINUED] doxycycline (VIBRA-TABS) 100 MG tablet Take 1 tablet (100 mg total) by mouth 2 (two) times daily.     Review of Systems  Constitutional: Negative.   HENT: Negative.   Eyes: Negative.   Respiratory:  Negative.   Cardiovascular: Negative.   Gastrointestinal: Negative.   Endocrine: Negative.   Genitourinary: Negative.   Musculoskeletal: Positive for arthralgias.  Skin: Negative.   Allergic/Immunologic: Negative.   Neurological: Negative.   Hematological: Negative.   Psychiatric/Behavioral: Negative.        Objective:   Physical Exam  Constitutional: He is oriented to person, place, and time. He appears well-developed and well-nourished. No distress.  Alert and pleasant  HENT:  Head: Normocephalic and atraumatic.  Right Ear: External ear normal.  Left Ear: External ear normal.  Nose: Nose normal.  Mouth/Throat: Oropharynx is clear and moist. No oropharyngeal exudate.  Eyes: Conjunctivae and EOM are normal. Pupils are equal, round, and reactive to light. Right eye exhibits no discharge. Left eye exhibits no discharge. No scleral icterus.  Neck: Normal range of motion. Neck supple. No thyromegaly present.  The patient has a right carotid bruit  Cardiovascular: Normal rate, regular rhythm and normal heart sounds.   No murmur heard. Radial pulses and pedal pulses are difficult to palpate in this patient. The heart rate is 60/m with a regular rate and rhythm.  Pulmonary/Chest: Effort normal and breath sounds normal. No respiratory distress. He has no wheezes. He has no rales. He exhibits no tenderness.  Abdominal: Soft. Bowel sounds are normal. He exhibits no mass. There is no tenderness. There is no rebound and no guarding.  The abdomen is nontender without masses or bruits or inguinal nodes. The inguinal pulses were also diminished bilaterally.  Genitourinary: Rectum normal.  Musculoskeletal: Normal range of motion. He exhibits no edema.  Lymphadenopathy:    He has no cervical adenopathy.  Neurological: He is alert and oriented to person, place, and time. He has normal reflexes. No cranial nerve deficit.  Skin: Skin is warm and dry. No rash noted. No erythema. No pallor.    Psychiatric: He has a normal mood and affect. His behavior is normal. Judgment and thought content normal.  Nursing note and vitals reviewed.  BP 137/63 mmHg  Pulse 69  Temp(Src) 97.1 F (36.2 C) (Oral)  Ht 5' 5" (1.651 m)  Wt 152 lb (68.947 kg)  BMI 25.29 kg/m2        Assessment & Plan:  1. Vitamin B12 deficiency -Continue with B12 injections and we will check a B12 level in the blood work today. - POCT CBC - Vitamin B12  2. BPH (benign prostatic hyperplasia) -He is having no voiding symptoms at this time and we will continue to monitor this regularly. - POCT CBC  3. Hyperlipemia -The patient should continue with his Crestor and with aggressive therapeutic lifestyle changes which includes diet and exercise - POCT CBC - NMR, lipoprofile  4. HYPERTENSION, BENIGN -The blood pressure is under good control today and he should continue with current treatment - POCT CBC - BMP8+EGFR - Hepatic function panel  5. Thrombocytopenia -The patient has had no bleeding issues and we will monitor this with the blood work today - POCT CBC  6. Vitamin D deficiency -Continue current treatment pending outcome of lab work today. - POCT CBC - Vit D  25 hydroxy (rtn osteoporosis monitoring)  7. Anxiety state -The patient appears to be calm and he will continue to take the alprazolam as needed - ALPRAZolam (XANAX) 1 MG tablet; Take 1 tablet (1 mg total) by mouth 3 (three) times daily as needed for anxiety.  Dispense: 90 tablet; Refill: 1  8. Peripheral vascular insufficiency -The patient was encouraged to walk and to follow-up with cardiology who may do further studies to evaluate circulation in lower extremities.  9. Claudication -Continue walking exercise  10. Right carotid bruit -Follow-up with cardiology and get repeat Dopplers as needed  11. History of right lung cancer -Follow-up with radiation oncology  Meds ordered this encounter  Medications  . ALPRAZolam (XANAX) 1 MG  tablet    Sig: Take 1 tablet (1 mg total) by mouth 3 (three) times daily as needed for anxiety.    Dispense:  90 tablet    Refill:  1  . HYDROcodone-acetaminophen (NORCO) 10-325 MG per tablet    Sig: Take 1 tablet by mouth every 6 (six) hours as needed for moderate pain.    Dispense:  120 tablet    Refill:  0    Do not fill until 12/01/14   Patient Instructions                       Medicare Annual Wellness Visit  [facility] and the medical providers at [facility] strive to bring you the best medical care.  In doing so we not only want to address your current medical conditions and concerns but also to detect new conditions early and prevent illness, disease and health-related problems.    Medicare offers a yearly Wellness Visit which allows our clinical staff to assess your need for preventative services including immunizations, lifestyle education, counseling to decrease risk of preventable diseases and screening for fall risk and other medical concerns.    This visit is provided free of charge (no copay) for all Medicare recipients. The clinical pharmacists at [facility] have begun to conduct these Wellness Visits which will also include a thorough review of all your medications.    As you primary medical provider recommend that you make an appointment for your Annual Wellness Visit if you have not done so already this year.  You may set up this appointment before you leave today or you may call back ([phone]) and schedule an appointment.  Please make sure when you call that you mention that you are scheduling your Annual Wellness Visit with the clinical pharmacist so that the appointment may be made for the proper length of time.       Continue current medications. Continue good therapeutic lifestyle changes which include good diet and exercise. Fall precautions discussed with patient. If an FOBT was given today- please return it to our  front desk. If you are over 17 years old - you may need Prevnar 31 or the adult Pneumonia vaccine.  Flu Shots are still available at our office. If you still haven't had one please call to set up a nurse visit to get one.   After your visit with [place] today you will receive a survey in the mail or online from [employer] regarding your care with [place]. Please take a moment to fill this out. Your feedback is very important to [place] as you can  help [place] better understand your patient needs as well as improve your experience and satisfaction. WE CARE ABOUT YOU!!!  Continue to exercise regularly, walking is the best exercise. Continue follow-up with oncology and Dr. Pablo Ledger Continue follow-up with Dr. Burt Knack the cardiologist Try to take over-the-counter Tylenol instead of the controlled drug of hydrocodone. Continue to drink plenty of fluids   Arrie Senate MD

## 2014-11-23 NOTE — Patient Instructions (Addendum)
Medicare Annual Wellness Visit  [facility] and the medical providers at [facility] strive to bring you the best medical care.  In doing so we not only want to address your current medical conditions and concerns but also to detect new conditions early and prevent illness, disease and health-related problems.    Medicare offers a yearly Wellness Visit which allows our clinical staff to assess your need for preventative services including immunizations, lifestyle education, counseling to decrease risk of preventable diseases and screening for fall risk and other medical concerns.    This visit is provided free of charge (no copay) for all Medicare recipients. The clinical pharmacists at [facility] have begun to conduct these Wellness Visits which will also include a thorough review of all your medications.    As you primary medical provider recommend that you make an appointment for your Annual Wellness Visit if you have not done so already this year.  You may set up this appointment before you leave today or you may call back ([phone]) and schedule an appointment.  Please make sure when you call that you mention that you are scheduling your Annual Wellness Visit with the clinical pharmacist so that the appointment may be made for the proper length of time.       Continue current medications. Continue good therapeutic lifestyle changes which include good diet and exercise. Fall precautions discussed with patient. If an FOBT was given today- please return it to our front desk. If you are over 27 years old - you may need Prevnar 56 or the adult Pneumonia vaccine.  Flu Shots are still available at our office. If you still haven't had one please call to set up a nurse visit to get one.   After your visit with [place] today you will receive a survey in the mail or online from [employer] regarding your care with [place]. Please take a moment  to fill this out. Your feedback is very important to [place] as you can help [place] better understand your patient needs as well as improve your experience and satisfaction. WE CARE ABOUT YOU!!!  Continue to exercise regularly, walking is the best exercise. Continue follow-up with oncology and Dr. Pablo Ledger Continue follow-up with Dr. Burt Knack the cardiologist Try to take over-the-counter Tylenol instead of the controlled drug of hydrocodone. Continue to drink plenty of fluids

## 2014-11-23 NOTE — Addendum Note (Signed)
Addended by: Earlene Plater on: 11/23/2014 10:13 AM   Modules accepted: Orders

## 2014-11-24 LAB — CBC WITH DIFFERENTIAL/PLATELET
BASOS ABS: 0 10*3/uL (ref 0.0–0.2)
Basos: 1 %
EOS: 3 %
Eosinophils Absolute: 0.1 10*3/uL (ref 0.0–0.4)
HEMATOCRIT: 40.1 % (ref 37.5–51.0)
HEMOGLOBIN: 13.2 g/dL (ref 12.6–17.7)
Immature Grans (Abs): 0 10*3/uL (ref 0.0–0.1)
Immature Granulocytes: 0 %
LYMPHS: 14 %
Lymphocytes Absolute: 0.6 10*3/uL — ABNORMAL LOW (ref 0.7–3.1)
MCH: 29.3 pg (ref 26.6–33.0)
MCHC: 32.9 g/dL (ref 31.5–35.7)
MCV: 89 fL (ref 79–97)
Monocytes Absolute: 0.4 10*3/uL (ref 0.1–0.9)
Monocytes: 9 %
NEUTROS ABS: 3.4 10*3/uL (ref 1.4–7.0)
Neutrophils Relative %: 73 %
Platelets: 156 10*3/uL (ref 150–379)
RBC: 4.51 x10E6/uL (ref 4.14–5.80)
RDW: 13.6 % (ref 12.3–15.4)
WBC: 4.6 10*3/uL (ref 3.4–10.8)

## 2014-11-24 LAB — BMP8+EGFR
BUN/Creatinine Ratio: 12 (ref 10–22)
BUN: 11 mg/dL (ref 8–27)
CALCIUM: 9.1 mg/dL (ref 8.6–10.2)
CO2: 22 mmol/L (ref 18–29)
Chloride: 106 mmol/L (ref 97–108)
Creatinine, Ser: 0.92 mg/dL (ref 0.76–1.27)
GFR calc Af Amer: 98 mL/min/{1.73_m2} (ref >59)
GFR calc non Af Amer: 85 mL/min/{1.73_m2} (ref >59)
Glucose: 102 mg/dL — ABNORMAL HIGH (ref 65–99)
Potassium: 4 mmol/L (ref 3.5–5.2)
Sodium: 142 mmol/L (ref 134–144)

## 2014-11-24 LAB — HEPATIC FUNCTION PANEL
ALBUMIN: 4.3 g/dL (ref 3.6–4.8)
ALT: 20 IU/L (ref 0–44)
AST: 14 IU/L (ref 0–40)
Alkaline Phosphatase: 48 IU/L (ref 39–117)
BILIRUBIN TOTAL: 0.4 mg/dL (ref 0.0–1.2)
BILIRUBIN, DIRECT: 0.12 mg/dL (ref 0.00–0.40)
Total Protein: 6.5 g/dL (ref 6.0–8.5)

## 2014-11-24 LAB — NMR, LIPOPROFILE
Cholesterol: 127 mg/dL (ref 100–199)
HDL Cholesterol by NMR: 34 mg/dL — ABNORMAL LOW (ref >39)
HDL Particle Number: 31.5 umol/L (ref >=30.5)
LDL PARTICLE NUMBER: 953 nmol/L (ref <1000)
LDL SIZE: 20.2 nm (ref >20.5)
LDL-C: 62 mg/dL (ref 0–99)
LP-IR SCORE: 79 — AB (ref <=45)
Small LDL Particle Number: 622 nmol/L — ABNORMAL HIGH (ref <=527)
Triglycerides by NMR: 155 mg/dL — ABNORMAL HIGH (ref 0–149)

## 2014-11-24 LAB — VITAMIN D 25 HYDROXY (VIT D DEFICIENCY, FRACTURES): Vit D, 25-Hydroxy: 36.5 ng/mL (ref 30.0–100.0)

## 2014-11-24 LAB — VITAMIN B12: VITAMIN B 12: 369 pg/mL (ref 211–946)

## 2014-11-27 ENCOUNTER — Ambulatory Visit (INDEPENDENT_AMBULATORY_CARE_PROVIDER_SITE_OTHER): Payer: Medicare Other | Admitting: *Deleted

## 2014-11-27 DIAGNOSIS — E538 Deficiency of other specified B group vitamins: Secondary | ICD-10-CM

## 2014-11-27 DIAGNOSIS — R5383 Other fatigue: Secondary | ICD-10-CM | POA: Diagnosis not present

## 2014-11-27 DIAGNOSIS — R5381 Other malaise: Secondary | ICD-10-CM

## 2014-11-27 NOTE — Patient Instructions (Signed)

## 2014-11-27 NOTE — Progress Notes (Signed)
Vitamin b12 injection given and tolerated well.  

## 2014-12-13 ENCOUNTER — Other Ambulatory Visit: Payer: Self-pay | Admitting: *Deleted

## 2014-12-13 MED ORDER — ROSUVASTATIN CALCIUM 40 MG PO TABS: 40.0000 mg | ORAL_TABLET | Freq: Every day | ORAL | Status: DC

## 2014-12-24 ENCOUNTER — Other Ambulatory Visit: Payer: Self-pay | Admitting: Family Medicine

## 2014-12-27 ENCOUNTER — Ambulatory Visit: Payer: Medicare Other

## 2015-01-01 ENCOUNTER — Telehealth: Payer: Self-pay | Admitting: Family Medicine

## 2015-01-01 ENCOUNTER — Other Ambulatory Visit: Payer: Self-pay

## 2015-01-01 MED ORDER — HYDROCODONE-ACETAMINOPHEN 10-325 MG PO TABS: 1.0000 | ORAL_TABLET | Freq: Four times a day (QID) | ORAL | Status: DC | PRN

## 2015-01-01 NOTE — Telephone Encounter (Signed)
Lat seen 11/23/14 [facility]  If approved print and route to nurse

## 2015-01-01 NOTE — Telephone Encounter (Signed)
This is okay to refill 1 

## 2015-01-09 ENCOUNTER — Other Ambulatory Visit: Payer: Self-pay | Admitting: Family Medicine

## 2015-01-24 ENCOUNTER — Other Ambulatory Visit: Payer: Self-pay | Admitting: Family Medicine

## 2015-01-24 DIAGNOSIS — F411 Generalized anxiety disorder: Secondary | ICD-10-CM

## 2015-01-24 MED ORDER — HYDROCODONE-ACETAMINOPHEN 10-325 MG PO TABS: 1.0000 | ORAL_TABLET | Freq: Four times a day (QID) | ORAL | Status: DC | PRN

## 2015-01-24 MED ORDER — ALPRAZOLAM 1 MG PO TABS: 1.0000 mg | ORAL_TABLET | Freq: Three times a day (TID) | ORAL | Status: DC | PRN

## 2015-01-24 NOTE — Telephone Encounter (Signed)
RX ready for pick up 

## 2015-01-24 NOTE — Telephone Encounter (Signed)
Pt aware written Rx is at front office ready for pickup

## 2015-01-24 NOTE — Telephone Encounter (Signed)
It is okay to refill her each one of these prescriptions 1

## 2015-01-25 NOTE — Telephone Encounter (Signed)
RX was done

## 2015-01-29 ENCOUNTER — Ambulatory Visit (INDEPENDENT_AMBULATORY_CARE_PROVIDER_SITE_OTHER): Payer: Medicare Other | Admitting: *Deleted

## 2015-01-29 DIAGNOSIS — E538 Deficiency of other specified B group vitamins: Secondary | ICD-10-CM

## 2015-01-29 DIAGNOSIS — R5381 Other malaise: Secondary | ICD-10-CM | POA: Diagnosis not present

## 2015-01-29 DIAGNOSIS — R5383 Other fatigue: Secondary | ICD-10-CM

## 2015-01-29 NOTE — Progress Notes (Signed)
Vitamin b12 injection given and tolerated well.  

## 2015-01-29 NOTE — Patient Instructions (Signed)

## 2015-02-13 ENCOUNTER — Other Ambulatory Visit (HOSPITAL_COMMUNITY): Payer: Self-pay | Admitting: Cardiovascular Disease

## 2015-02-13 DIAGNOSIS — I6523 Occlusion and stenosis of bilateral carotid arteries: Secondary | ICD-10-CM

## 2015-02-20 ENCOUNTER — Encounter (HOSPITAL_COMMUNITY): Payer: Medicare Other

## 2015-02-21 ENCOUNTER — Ambulatory Visit (HOSPITAL_COMMUNITY): Payer: Medicare Other | Attending: Cardiovascular Disease

## 2015-02-21 DIAGNOSIS — Z87891 Personal history of nicotine dependence: Secondary | ICD-10-CM | POA: Insufficient documentation

## 2015-02-21 DIAGNOSIS — E785 Hyperlipidemia, unspecified: Secondary | ICD-10-CM | POA: Diagnosis not present

## 2015-02-21 DIAGNOSIS — I2510 Atherosclerotic heart disease of native coronary artery without angina pectoris: Secondary | ICD-10-CM | POA: Diagnosis not present

## 2015-02-21 DIAGNOSIS — Z951 Presence of aortocoronary bypass graft: Secondary | ICD-10-CM | POA: Insufficient documentation

## 2015-02-21 DIAGNOSIS — I10 Essential (primary) hypertension: Secondary | ICD-10-CM | POA: Diagnosis not present

## 2015-02-21 DIAGNOSIS — I6523 Occlusion and stenosis of bilateral carotid arteries: Secondary | ICD-10-CM

## 2015-02-27 ENCOUNTER — Telehealth: Payer: Self-pay | Admitting: Family Medicine

## 2015-02-27 MED ORDER — GABAPENTIN 300 MG PO CAPS: ORAL_CAPSULE | ORAL | Status: DC

## 2015-02-27 MED ORDER — HYDROCODONE-ACETAMINOPHEN 10-325 MG PO TABS: 1.0000 | ORAL_TABLET | Freq: Four times a day (QID) | ORAL | Status: DC | PRN

## 2015-02-27 NOTE — Telephone Encounter (Signed)
We do not write hydrocodone for 90 days

## 2015-03-01 ENCOUNTER — Ambulatory Visit (INDEPENDENT_AMBULATORY_CARE_PROVIDER_SITE_OTHER): Payer: Medicare Other | Admitting: *Deleted

## 2015-03-01 DIAGNOSIS — E538 Deficiency of other specified B group vitamins: Secondary | ICD-10-CM | POA: Diagnosis not present

## 2015-03-01 NOTE — Progress Notes (Signed)
Pt given B12 injection IM left deltoid and tolerated well. °

## 2015-03-01 NOTE — Patient Instructions (Signed)

## 2015-03-30 ENCOUNTER — Encounter: Payer: Self-pay | Admitting: Family Medicine

## 2015-03-30 ENCOUNTER — Ambulatory Visit (INDEPENDENT_AMBULATORY_CARE_PROVIDER_SITE_OTHER): Payer: Medicare Other | Admitting: Family Medicine

## 2015-03-30 VITALS — BP 133/51 | HR 49 | Temp 97.5°F | Ht 65.0 in | Wt 151.0 lb

## 2015-03-30 DIAGNOSIS — E559 Vitamin D deficiency, unspecified: Secondary | ICD-10-CM

## 2015-03-30 DIAGNOSIS — T451X5A Adverse effect of antineoplastic and immunosuppressive drugs, initial encounter: Secondary | ICD-10-CM

## 2015-03-30 DIAGNOSIS — E785 Hyperlipidemia, unspecified: Secondary | ICD-10-CM

## 2015-03-30 DIAGNOSIS — I10 Essential (primary) hypertension: Secondary | ICD-10-CM

## 2015-03-30 DIAGNOSIS — N400 Enlarged prostate without lower urinary tract symptoms: Secondary | ICD-10-CM

## 2015-03-30 DIAGNOSIS — G622 Polyneuropathy due to other toxic agents: Secondary | ICD-10-CM | POA: Diagnosis not present

## 2015-03-30 DIAGNOSIS — I6523 Occlusion and stenosis of bilateral carotid arteries: Secondary | ICD-10-CM

## 2015-03-30 DIAGNOSIS — J701 Chronic and other pulmonary manifestations due to radiation: Secondary | ICD-10-CM | POA: Diagnosis not present

## 2015-03-30 DIAGNOSIS — E538 Deficiency of other specified B group vitamins: Secondary | ICD-10-CM | POA: Diagnosis not present

## 2015-03-30 DIAGNOSIS — Z85118 Personal history of other malignant neoplasm of bronchus and lung: Secondary | ICD-10-CM | POA: Diagnosis not present

## 2015-03-30 DIAGNOSIS — F411 Generalized anxiety disorder: Secondary | ICD-10-CM

## 2015-03-30 DIAGNOSIS — I739 Peripheral vascular disease, unspecified: Secondary | ICD-10-CM

## 2015-03-30 DIAGNOSIS — D696 Thrombocytopenia, unspecified: Secondary | ICD-10-CM

## 2015-03-30 DIAGNOSIS — G620 Drug-induced polyneuropathy: Secondary | ICD-10-CM

## 2015-03-30 LAB — POCT URINALYSIS DIPSTICK
BILIRUBIN UA: NEGATIVE
Glucose, UA: NEGATIVE
KETONES UA: NEGATIVE
Leukocytes, UA: NEGATIVE
Nitrite, UA: NEGATIVE
PH UA: 5
PROTEIN UA: NEGATIVE
RBC UA: NEGATIVE
SPEC GRAV UA: 1.015
Urobilinogen, UA: NEGATIVE

## 2015-03-30 LAB — POCT UA - MICROSCOPIC ONLY
Bacteria, U Microscopic: NEGATIVE
CRYSTALS, UR, HPF, POC: NEGATIVE
Casts, Ur, LPF, POC: NEGATIVE
Mucus, UA: NEGATIVE
RBC, urine, microscopic: NEGATIVE
WBC, Ur, HPF, POC: NEGATIVE
YEAST UA: NEGATIVE

## 2015-03-30 MED ORDER — HYDROCODONE-ACETAMINOPHEN 10-325 MG PO TABS: 1.0000 | ORAL_TABLET | Freq: Four times a day (QID) | ORAL | Status: DC | PRN

## 2015-03-30 MED ORDER — ALPRAZOLAM 1 MG PO TABS: 1.0000 mg | ORAL_TABLET | Freq: Three times a day (TID) | ORAL | Status: DC | PRN

## 2015-03-30 NOTE — Patient Instructions (Addendum)
Medicare Annual Wellness Visit  [facility] and the medical providers at [facility] strive to bring you the best medical care.  In doing so we not only want to address your current medical conditions and concerns but also to detect new conditions early and prevent illness, disease and health-related problems.    Medicare offers a yearly Wellness Visit which allows our clinical staff to assess your need for preventative services including immunizations, lifestyle education, counseling to decrease risk of preventable diseases and screening for fall risk and other medical concerns.    This visit is provided free of charge (no copay) for all Medicare recipients. The clinical pharmacists at [facility] have begun to conduct these Wellness Visits which will also include a thorough review of all your medications.    As you primary medical provider recommend that you make an appointment for your Annual Wellness Visit if you have not done so already this year.  You may set up this appointment before you leave today or you may call back ([phone]) and schedule an appointment.  Please make sure when you call that you mention that you are scheduling your Annual Wellness Visit with the clinical pharmacist so that the appointment may be made for the proper length of time.     Continue current medications. Continue good therapeutic lifestyle changes which include good diet and exercise. Fall precautions discussed with patient. If an FOBT was given today- please return it to our front desk. If you are over 19 years old - you may need Prevnar 46 or the adult Pneumonia vaccine.  Flu Shots are still available at our office. If you still haven't had one please call to set up a nurse visit to get one.   After your visit with [place] today you will receive a survey in the mail or online from [employer] regarding your care with [place]. Please take a moment to  fill this out. Your feedback is very important to [place] as you can help [place] better understand your patient needs as well as improve your experience and satisfaction. WE CARE ABOUT YOU!!!   Continue regular follow-up with cardiology, oncology Continue to drink plenty of fluids especially this summer and stay active physically.

## 2015-03-30 NOTE — Progress Notes (Signed)
Subjective:    Patient ID: [name], male    DOB: 1946/08/22, 69 y.o.   MRN: [id number]  HPI Pt here for follow up and management of chronic medical problems which includes hypertension and hyperlipidemia. He is taking medications regularly. Patient has a history of coronary artery disease lung cancer myocardial infarction and hypertension. He also has hyperlipidemia. He has some normal aches and pains today no more than usual. He has plans to see the cardiologist soon. He is requesting a refill on his pain medicine and Xanax. He is due to get a prostate exam and a PSA today in addition to returning an FOBT and getting lab work.       pathology  Review of Systems  Constitutional: Negative.   HENT: Negative.   Eyes: Negative.   Respiratory: Negative.   Cardiovascular: Negative.   Gastrointestinal: Negative.   Endocrine: Negative.   Genitourinary: Negative.   Musculoskeletal: Positive for arthralgias.  Skin: Negative.   Allergic/Immunologic: Negative.   Neurological: Negative.   Hematological: Negative.   Psychiatric/Behavioral: Negative.        Objective:   Physical Exam  Constitutional: He is oriented to person, place, and time. He appears well-developed and well-nourished. No distress.  The patient is pleasant and alert and comes to the visit by himself.  HENT:  Head: Normocephalic and atraumatic.  Right Ear: External ear normal.  Left Ear: External ear normal.  Nose: Nose normal.  Mouth/Throat: Oropharynx is clear and moist. No oropharyngeal exudate.  Ears cerumen right ear canal  Eyes: Conjunctivae and EOM are normal. Pupils are equal, round, and reactive to light. Right eye exhibits no discharge. Left eye exhibits no discharge. No scleral icterus.  Neck: Normal range of motion. Neck supple. No thyromegaly present.  Patient has bilateral bruits in the neck right carotid and left neck.  Cardiovascular: Normal rate, regular rhythm and normal heart sounds.   Exam reveals no gallop and no friction rub.   No murmur heard. The rhythm is regular at 60/m. The posterior tibial was palpable on the right side but pulses were difficult to palpate in the left lower extremity either dorsalis pedis or posterior tibial.  Pulmonary/Chest: Effort normal and breath sounds normal. No respiratory distress. He has no wheezes. He has no rales. He exhibits no tenderness.  Abdominal: Soft. Bowel sounds are normal. He exhibits no mass. There is no tenderness. There is no rebound and no guarding.  There were no abdominal bruits. The inguinal pulses were also difficult to palpate. The inguinal pulses were also difficult to palpate.  Genitourinary: Rectum normal and penis normal.  The prostate was enlarged left greater than right with no lumps or masses. There are no rectal masses. There were no inguinal hernias palpable. The external genitalia were otherwise within normal limits.  Musculoskeletal: Normal range of motion. He exhibits no edema or tenderness.  Lymphadenopathy:    He has no cervical adenopathy.  Neurological: He is alert and oriented to person, place, and time. He has normal reflexes. No cranial nerve deficit.  The right knee jerk was slightly diminished compared to the left.  Skin: Skin is warm and dry. No rash noted. No erythema. No pallor.  Psychiatric: He has a normal mood and affect. His behavior is normal. Judgment and thought content normal.  Nursing note and vitals reviewed.  BP 133/51 mmHg  Pulse 49  Temp(Src) 97.5 F (36.4 C) (Oral)  Ht 5' 5"  (1.651 m)  Wt 151 lb (68.493 kg)  BMI 25.13 kg/m2        Assessment & Plan:  1. BPH (benign prostatic hyperplasia) -The patient denies any problems with voiding and is doing well with a large prostate gland we will get a PSA to confirm that everything is okay. - PSA Total+%Free (Serial) - POCT UA - Microscopic Only - POCT urinalysis dipstick - CBC with Differential/Platelet  2. Hyperlipemia -He  has a history of coronary artery disease and he will be getting his lipids done today and he should continue with his Crestor and aggressive therapeutic lifestyle changes - NMR, lipoprofile - CBC with Differential/Platelet  3. HYPERTENSION, BENIGN -The blood pressure is good today, the diastolic is slightly decreased and we will will not change his medication from this. - BMP8+EGFR - Hepatic function panel - CBC with Differential/Platelet  4. Thrombocytopenia -He is having no bleeding issues regarding the thrombocytopenia - CBC with Differential/Platelet  5. Vitamin D deficiency -The patient is currently not taking any vitamin D and we will make a decision about treatment pending results of lab work - Vit D  25 hydroxy (rtn osteoporosis monitoring)  6. Vitamin B12 deficiency -Continue current treatment pending results of lab work  7. Anxiety state -He should continue with his medication and he seems to be calm today. - ALPRAZolam (XANAX) 1 MG tablet; Take 1 tablet (1 mg total) by mouth 3 (three) times daily as needed for anxiety.  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  9. Claudication -He does have a history of claudication but with no complaints with this today and he still has diminished pulses in his lower extremities left greater than right  10. Radiation fibrosis of lung -He follows up regularly with oncology and radiology  Meds ordered this encounter  Medications  . ALPRAZolam (XANAX) 1 MG tablet    Sig: Take 1 tablet (1 mg total) by mouth 3 (three) times daily as needed for anxiety.    Dispense:  90 tablet    Refill:  1  . HYDROcodone-acetaminophen (NORCO) 10-325 MG per tablet    Sig: Take 1 tablet by mouth every 6 (six) hours as needed for moderate pain.    Dispense:  120 tablet    Refill:   0    Do not fill until 12/01/14   Patient Instructions                       Medicare Annual Wellness Visit  [facility] and the medical providers at [facility] strive to bring you the best medical care.  In doing so we not only want to address your current medical conditions and concerns but also to detect new conditions early and prevent illness, disease and health-related problems.    Medicare offers a yearly Wellness Visit which allows our clinical staff to assess your need for preventative services including immunizations, lifestyle education, counseling to decrease risk of preventable diseases and screening for fall risk and other medical concerns.    This visit is provided free of charge (no copay) for all Medicare recipients. The clinical pharmacists at [facility] have begun to conduct these Wellness Visits which will also include a thorough review of all your medications.    As you primary medical provider recommend that you make an appointment for your Annual Wellness Visit  if you have not done so already this year.  You may set up this appointment before you leave today or you may call back ([phone]) and schedule an appointment.  Please make sure when you call that you mention that you are scheduling your Annual Wellness Visit with the clinical pharmacist so that the appointment may be made for the proper length of time.     Continue current medications. Continue good therapeutic lifestyle changes which include good diet and exercise. Fall precautions discussed with patient. If an FOBT was given today- please return it to our front desk. If you are over 72 years old - you may need Prevnar 79 or the adult Pneumonia vaccine.  Flu Shots are still available at our office. If you still haven't had one please call to set up a nurse visit to get one.   After your visit with [place] today you will receive a survey in the mail or online from [employer] regarding your care with [place]. Please take a moment to fill this out. Your feedback is very important to [place] as you can help [place] better understand your patient needs as well as improve your experience and satisfaction. WE CARE ABOUT YOU!!!   Continue regular follow-up with cardiology, oncology Continue to drink plenty of fluids especially this summer and stay active physically.   Arrie Senate MD

## 2015-03-31 LAB — BMP8+EGFR
BUN/Creatinine Ratio: 10 (ref 10–22)
BUN: 9 mg/dL (ref 8–27)
CALCIUM: 8.7 mg/dL (ref 8.6–10.2)
CHLORIDE: 101 mmol/L (ref 97–108)
CO2: 24 mmol/L (ref 18–29)
CREATININE: 0.89 mg/dL (ref 0.76–1.27)
GFR calc non Af Amer: 87 mL/min/{1.73_m2} (ref >59)
GFR, EST AFRICAN AMERICAN: 101 mL/min/{1.73_m2} (ref >59)
Glucose: 102 mg/dL — ABNORMAL HIGH (ref 65–99)
Potassium: 4.1 mmol/L (ref 3.5–5.2)
Sodium: 143 mmol/L (ref 134–144)

## 2015-03-31 LAB — NMR, LIPOPROFILE
Cholesterol: 122 mg/dL (ref 100–199)
HDL CHOLESTEROL BY NMR: 36 mg/dL — AB (ref >39)
HDL PARTICLE NUMBER: 35.7 umol/L (ref >=30.5)
LDL PARTICLE NUMBER: 947 nmol/L (ref <1000)
LDL Size: 20.1 nm (ref >20.5)
LDL-C: 60 mg/dL (ref 0–99)
LP-IR Score: 72 — ABNORMAL HIGH (ref <=45)
Small LDL Particle Number: 526 nmol/L (ref <=527)
TRIGLYCERIDES BY NMR: 129 mg/dL (ref 0–149)

## 2015-03-31 LAB — PSA TOTAL+% FREE (SERIAL)
PSA FREE: 0.17 ng/mL
PSA, Free Pct: 42.5 %
Prostate Specific Ag, Serum: 0.4 ng/mL (ref 0.0–4.0)

## 2015-03-31 LAB — HEPATIC FUNCTION PANEL
ALT: 17 IU/L (ref 0–44)
AST: 15 IU/L (ref 0–40)
Albumin: 4.2 g/dL (ref 3.6–4.8)
Alkaline Phosphatase: 48 IU/L (ref 39–117)
BILIRUBIN TOTAL: 0.4 mg/dL (ref 0.0–1.2)
BILIRUBIN, DIRECT: 0.1 mg/dL (ref 0.00–0.40)
Total Protein: 6.4 g/dL (ref 6.0–8.5)

## 2015-03-31 LAB — CBC WITH DIFFERENTIAL/PLATELET
BASOS ABS: 0 10*3/uL (ref 0.0–0.2)
BASOS: 1 %
EOS (ABSOLUTE): 0.1 10*3/uL (ref 0.0–0.4)
Eos: 3 %
Hematocrit: 38.7 % (ref 37.5–51.0)
Hemoglobin: 12.9 g/dL (ref 12.6–17.7)
IMMATURE GRANS (ABS): 0 10*3/uL (ref 0.0–0.1)
IMMATURE GRANULOCYTES: 0 %
Lymphocytes Absolute: 0.6 10*3/uL — ABNORMAL LOW (ref 0.7–3.1)
Lymphs: 14 %
MCH: 29.8 pg (ref 26.6–33.0)
MCHC: 33.3 g/dL (ref 31.5–35.7)
MCV: 89 fL (ref 79–97)
Monocytes Absolute: 0.4 10*3/uL (ref 0.1–0.9)
Monocytes: 10 %
Neutrophils Absolute: 3 10*3/uL (ref 1.4–7.0)
Neutrophils: 72 %
Platelets: 127 10*3/uL — ABNORMAL LOW (ref 150–379)
RBC: 4.33 x10E6/uL (ref 4.14–5.80)
RDW: 14 % (ref 12.3–15.4)
WBC: 4.2 10*3/uL (ref 3.4–10.8)

## 2015-03-31 LAB — VITAMIN D 25 HYDROXY (VIT D DEFICIENCY, FRACTURES): Vit D, 25-Hydroxy: 33.6 ng/mL (ref 30.0–100.0)

## 2015-04-02 ENCOUNTER — Ambulatory Visit (INDEPENDENT_AMBULATORY_CARE_PROVIDER_SITE_OTHER): Payer: Medicare Other | Admitting: *Deleted

## 2015-04-02 ENCOUNTER — Other Ambulatory Visit: Payer: Medicare Other

## 2015-04-02 DIAGNOSIS — E538 Deficiency of other specified B group vitamins: Secondary | ICD-10-CM

## 2015-04-02 DIAGNOSIS — Z1212 Encounter for screening for malignant neoplasm of rectum: Secondary | ICD-10-CM

## 2015-04-02 NOTE — Progress Notes (Signed)
Lab only 

## 2015-04-02 NOTE — Progress Notes (Signed)
Patient tolerated well.

## 2015-04-04 LAB — FECAL OCCULT BLOOD, IMMUNOCHEMICAL: Fecal Occult Bld: POSITIVE — AB

## 2015-04-05 NOTE — Progress Notes (Signed)
No VM on home phone

## 2015-04-30 ENCOUNTER — Telehealth: Payer: Self-pay | Admitting: Family Medicine

## 2015-04-30 MED ORDER — HYDROCODONE-ACETAMINOPHEN 10-325 MG PO TABS: 1.0000 | ORAL_TABLET | Freq: Four times a day (QID) | ORAL | Status: DC | PRN

## 2015-04-30 NOTE — Telephone Encounter (Signed)
Pt aware.

## 2015-05-03 ENCOUNTER — Ambulatory Visit (INDEPENDENT_AMBULATORY_CARE_PROVIDER_SITE_OTHER): Payer: Medicare Other

## 2015-05-03 DIAGNOSIS — E538 Deficiency of other specified B group vitamins: Secondary | ICD-10-CM

## 2015-05-03 MED ORDER — CYANOCOBALAMIN 1000 MCG/ML IJ SOLN
1000.0000 ug | INTRAMUSCULAR | Status: AC
  Administered 2015-05-03 – 2016-04-14 (×12): 1000 ug via INTRAMUSCULAR

## 2015-05-03 NOTE — Patient Instructions (Signed)

## 2015-05-03 NOTE — Progress Notes (Signed)
Administered b12 to left deltoid, patient tolerated well

## 2015-05-08 ENCOUNTER — Other Ambulatory Visit: Payer: Self-pay | Admitting: Family Medicine

## 2015-05-08 ENCOUNTER — Other Ambulatory Visit: Payer: Self-pay | Admitting: Cardiovascular Disease

## 2015-05-23 ENCOUNTER — Other Ambulatory Visit: Payer: Self-pay | Admitting: Cardiovascular Disease

## 2015-05-28 ENCOUNTER — Other Ambulatory Visit: Payer: Self-pay | Admitting: Family Medicine

## 2015-05-28 DIAGNOSIS — F411 Generalized anxiety disorder: Secondary | ICD-10-CM

## 2015-05-28 MED ORDER — HYDROCODONE-ACETAMINOPHEN 10-325 MG PO TABS: 1.0000 | ORAL_TABLET | Freq: Four times a day (QID) | ORAL | Status: DC | PRN

## 2015-05-28 MED ORDER — ALPRAZOLAM 1 MG PO TABS: 1.0000 mg | ORAL_TABLET | Freq: Three times a day (TID) | ORAL | Status: DC | PRN

## 2015-05-28 NOTE — Telephone Encounter (Signed)
These are okay to refill 1

## 2015-05-28 NOTE — Telephone Encounter (Signed)
Rx's refilled x 1, printed for Dr. Laurance Flatten to sign

## 2015-06-04 ENCOUNTER — Ambulatory Visit (INDEPENDENT_AMBULATORY_CARE_PROVIDER_SITE_OTHER): Payer: Medicare Other | Admitting: *Deleted

## 2015-06-04 DIAGNOSIS — Z1212 Encounter for screening for malignant neoplasm of rectum: Secondary | ICD-10-CM

## 2015-06-04 DIAGNOSIS — E538 Deficiency of other specified B group vitamins: Secondary | ICD-10-CM

## 2015-06-04 NOTE — Progress Notes (Signed)
Vitamin b12 injection given and tolerated well.  

## 2015-06-04 NOTE — Patient Instructions (Signed)

## 2015-06-05 ENCOUNTER — Other Ambulatory Visit: Payer: Self-pay | Admitting: Cardiovascular Disease

## 2015-06-06 ENCOUNTER — Other Ambulatory Visit: Payer: Self-pay | Admitting: *Deleted

## 2015-06-06 DIAGNOSIS — R195 Other fecal abnormalities: Secondary | ICD-10-CM

## 2015-06-06 LAB — FECAL OCCULT BLOOD, IMMUNOCHEMICAL: FECAL OCCULT BLD: POSITIVE — AB

## 2015-06-07 ENCOUNTER — Other Ambulatory Visit: Payer: Self-pay | Admitting: Family Medicine

## 2015-06-11 ENCOUNTER — Other Ambulatory Visit: Payer: Self-pay | Admitting: Family Medicine

## 2015-06-12 NOTE — Telephone Encounter (Signed)
Last lipid panel was 02/16, last office visit 06/16

## 2015-06-18 ENCOUNTER — Other Ambulatory Visit: Payer: Self-pay | Admitting: Cardiovascular Disease

## 2015-06-26 ENCOUNTER — Other Ambulatory Visit: Payer: Self-pay | Admitting: Family Medicine

## 2015-06-27 ENCOUNTER — Other Ambulatory Visit: Payer: Self-pay | Admitting: Family Medicine

## 2015-06-27 MED ORDER — HYDROCODONE-ACETAMINOPHEN 10-325 MG PO TABS: 1.0000 | ORAL_TABLET | Freq: Four times a day (QID) | ORAL | Status: DC | PRN

## 2015-06-27 NOTE — Telephone Encounter (Signed)
Last seen 03/30/15  DWM If approved route to nurse to call into [facility]

## 2015-06-27 NOTE — Telephone Encounter (Signed)
Last filled 05/28/15, last seen 03/30/15

## 2015-07-06 ENCOUNTER — Ambulatory Visit (INDEPENDENT_AMBULATORY_CARE_PROVIDER_SITE_OTHER): Payer: Medicare Other | Admitting: *Deleted

## 2015-07-06 DIAGNOSIS — E538 Deficiency of other specified B group vitamins: Secondary | ICD-10-CM

## 2015-07-15 ENCOUNTER — Other Ambulatory Visit: Payer: Self-pay | Admitting: Cardiovascular Disease

## 2015-07-24 ENCOUNTER — Telehealth: Payer: Self-pay | Admitting: Family Medicine

## 2015-07-25 MED ORDER — ALPRAZOLAM 1 MG PO TABS: ORAL_TABLET | ORAL | Status: DC

## 2015-07-25 MED ORDER — HYDROCODONE-ACETAMINOPHEN 10-325 MG PO TABS: 1.0000 | ORAL_TABLET | Freq: Four times a day (QID) | ORAL | Status: DC | PRN

## 2015-07-25 NOTE — Telephone Encounter (Signed)
Both filled 06/27/15, last seen 03/30/15. Both will print

## 2015-07-30 ENCOUNTER — Encounter: Payer: Self-pay | Admitting: Cardiovascular Disease

## 2015-07-30 ENCOUNTER — Ambulatory Visit (INDEPENDENT_AMBULATORY_CARE_PROVIDER_SITE_OTHER): Payer: Medicare Other | Admitting: Cardiovascular Disease

## 2015-07-30 VITALS — BP 150/66 | HR 58 | Ht 65.0 in | Wt 154.2 lb

## 2015-07-30 DIAGNOSIS — E785 Hyperlipidemia, unspecified: Secondary | ICD-10-CM | POA: Diagnosis not present

## 2015-07-30 DIAGNOSIS — I25708 Atherosclerosis of coronary artery bypass graft(s), unspecified, with other forms of angina pectoris: Secondary | ICD-10-CM | POA: Diagnosis not present

## 2015-07-30 DIAGNOSIS — R0602 Shortness of breath: Secondary | ICD-10-CM

## 2015-07-30 DIAGNOSIS — I6523 Occlusion and stenosis of bilateral carotid arteries: Secondary | ICD-10-CM | POA: Diagnosis not present

## 2015-07-30 DIAGNOSIS — I5043 Acute on chronic combined systolic (congestive) and diastolic (congestive) heart failure: Secondary | ICD-10-CM

## 2015-07-30 DIAGNOSIS — I10 Essential (primary) hypertension: Secondary | ICD-10-CM | POA: Diagnosis not present

## 2015-07-30 MED ORDER — HYDROCHLOROTHIAZIDE 12.5 MG PO CAPS: 25.0000 mg | ORAL_CAPSULE | Freq: Every day | ORAL | Status: DC

## 2015-07-30 MED ORDER — ISOSORBIDE MONONITRATE ER 60 MG PO TB24: 60.0000 mg | ORAL_TABLET | Freq: Every day | ORAL | Status: DC

## 2015-07-30 NOTE — Patient Instructions (Addendum)
Medication Instructions:  Your physician has recommended you make the following change in your medication:  1. INCREASE Microzide to 12.'5mg'$  take two capsules by mouth daily 2. INCREASE Imdur to '60mg'$  take one tablet by mouth daily  Labwork: No new orders.   Testing/Procedures: Your physician has requested that you have an echocardiogram. Echocardiography is a painless test that uses sound waves to create images of your heart. It provides your doctor with information about the size and shape of your heart and how well your heart's chambers and valves are working. This procedure takes approximately one hour. There are no restrictions for this procedure.  Follow-Up: Your physician wants you to follow-up in: 1 YEAR with Dr Burt Knack.  You will receive a reminder letter in the mail two months in advance. If you don't receive a letter, please call our office to schedule the follow-up appointment.   Any Other Special Instructions Will Be Listed Below (If Applicable).

## 2015-07-30 NOTE — Progress Notes (Signed)
Cardiology Office Note Date:  07/30/2015   ID:  [name], DOB [birth year], MRN [id number]  PCP:  Redge Gainer, MD  Cardiologist:  [name], MD    Chief Complaint  Patient presents with  . Follow-up    no complaints     History of Present Illness: [name] is a 69 y.o. male who presents for follow-up evaluation. He has a history of coronary artery disease status post CABG, carotid stenosis status post right carotid endarterectomy, hypertension, and hyperlipidemia. The patient also has a history of lung cancer. He has chronic occlusion of the left Internal carotid artery followed by serial duplex studies.   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 He underwent PCI of a totally occluded vein graft to the obtuse marginal. He then underwent staged PCI of another saphenous vein graft a few days later. He's had no recurrent ischemic event since that time.  He presented in 2015 with progressive anginal symptoms and underwent cardiac catheterization. This demonstrated continued patency of the LIMA to LAD and vein graft sequential to OM 3 and left posterolateral. The vein graft sequence to the OM1 and OM 2 branches was totally occluded. He was noted to have mild LV dysfunction with an ejection fraction of 45%. Medical therapy was recommended.  He had a recent episode of chest discomfort associated with shortness of breath and PND. This resolved after a sublingual NTG and has not recurred. He takes NTG about 6 times over the last 2 weeks. He is short of breath with exertion. Denies lightheadedness or syncope. Describes chest pain as a pressure, non-radiating, not consistently related to physical activity.  Past Medical History  Diagnosis Date  . Hypertension   . Hyperlipidemia   . CAD (coronary artery disease)     Dr. Verl Blalock  - Cardiologist   . Dementia   . PVD (peripheral vascular disease) ([facility] [place])     . ASCVD (arteriosclerotic cardiovascular disease)   . Anxiety     Dr. Corrinne Eagle    . OA (osteoarthritis)   . Neuropathic pain   . History of radiation therapy 11/15/08-02/23/09    RUL 66Gy/31f  . [place]     lung ca dx 1/10  . [place]estive [place] [place]   . Radiation May 2010    Right upper lobe lung/Adenocarcinoma    Past Surgical History  Procedure Laterality Date  . Coronary artery bypass graft  7/95    x5 ([facility] )  . Carotid endarterectomy  12/98    right ([facility])  . Cardiac catheterization  12/20/04    recurrent   . Lung cancer surgery      had ratiation of right lung  . Left heart catheterization with coronary angiogram N/A 12/16/2013    Procedure: LEFT HEART CATHETERIZATION WITH CORONARY ANGIOGRAM;  Surgeon: MBlane Ohara MD;  [facility];  Service: Cardiovascular;  Laterality: N/A;    Current Outpatient Prescriptions  Medication Sig Dispense Refill  . ALPRAZolam (XANAX) 1 MG tablet TAKE 1 TABLET 3 TIMES A DAY AS NEEDED FOR ANXIETY 90 tablet 1  . amLODipine (NORVASC) 10 MG tablet Take 10 mg by mouth daily.    .Marland Kitchenaspirin EC 81 MG tablet Take 1 tablet (81 mg total) by mouth daily. 30 tablet 4  . fenofibrate 160 MG tablet TAKE 1 TABLET (160 MG TOTAL) BY MOUTH DAILY. 30 tablet 4  . gabapentin (NEURONTIN) 300  MG capsule TAKE 1 CAPSULE (300 MG TOTAL) BY MOUTH EVERY 8 (EIGHT) HOURS. 270 capsule 3  . hydrochlorothiazide (MICROZIDE) 12.5 MG capsule Take 12.5 mg by mouth daily.    Marland Kitchen HYDROcodone-acetaminophen (NORCO) 10-325 MG tablet Take 1 tablet by mouth every 6 (six) hours as needed for moderate pain. 120 tablet 0  . isosorbide mononitrate (IMDUR) 30 MG 24 hr tablet TAKE 1 TABLET (30 MG TOTAL) BY MOUTH DAILY. 90 tablet 3  . KLOR-CON M20 20 MEQ tablet TAKE 1 TABLET (20 MEQ TOTAL) BY MOUTH DAILY. 30 tablet 0  . metoprolol (LOPRESSOR) 50 MG tablet TAKE 1 TABLET (50 MG TOTAL) BY MOUTH 2 (TWO) TIMES DAILY. 180 tablet 2  . Multiple Vitamin (MULTIVITAMIN)  capsule Take 1 capsule by mouth daily.      . nitroGLYCERIN (NITROSTAT) 0.4 MG SL tablet Place 0.4 mg under the tongue every 5 (five) minutes as needed. After 3 doses and no relief call 911    . prasugrel (EFFIENT) 10 MG TABS tablet Take 10 mg by mouth daily.    . rosuvastatin (CRESTOR) 40 MG tablet Take 20 mg by mouth daily.     Current Facility-Administered Medications  Medication Dose Route Frequency Provider Last Rate Last Dose  . cyanocobalamin ((VITAMIN B-12)) injection 1,000 mcg  1,000 mcg Intramuscular Q30 days Wardell Honour, MD   1,000 mcg at 07/06/15 1020    Allergies:   Demerol; Clopidogrel bisulfate; Meperidine hcl; Penicillins; and Propoxyphene n-acetaminophen   Social History:  The patient  reports that he has quit smoking. He does not have any smokeless tobacco history on file. He reports that he does not drink alcohol or use illicit drugs.   Family History:  The patient's  family history includes Coronary artery disease in his other; Heart attack in his mother; Heart disease in his brother, father, and sister.    ROS:  Please see the history of present illness.  Otherwise, review of systems is positive for leg pain, anxiety.  All other systems are reviewed and negative.    PHYSICAL EXAM: VS:  BP 150/66 mmHg  Pulse 58  Ht '5\' 5"'$  (1.651 m)  Wt 154 lb 3.2 oz (69.945 kg)  BMI 25.66 kg/m2 , BMI Body mass index is 25.66 kg/(m^2). GEN: Well nourished, well developed, pleasant elderly appearing male in no acute distress HEENT: normal Neck: no JVD, no masses. Bilateral carotid bruits Cardiac: RRR without murmur or gallop                Respiratory:  clear to auscultation bilaterally, normal work of breathing GI: soft, nontender, nondistended, + BS MS: no deformity or atrophy Ext: trace pretibial edema Skin: warm and dry, no rash Neuro:  Strength and sensation are intact Psych: euthymic mood, full affect  EKG:  EKG is ordered today. Sinus rhythm 58 bpm, T wave  abnormality consider inferolateral ischemia  Recent Labs: 11/23/2014: Hemoglobin 13.2; Platelets 156 03/30/2015: ALT 17; BUN 9; Creatinine, Ser 0.89; Potassium 4.1; Sodium 143   Lipid Panel     Component Value Date/Time   CHOL 122 03/30/2015 1019   CHOL 127 03/17/2013 0928   TRIG 129 03/30/2015 1019   TRIG 148 03/17/2013 0928   TRIG 135 11/16/2010 0400   HDL 36* 03/30/2015 1019   HDL 34* 03/17/2013 0928   HDL 35* 11/16/2010 0400   CHOLHDL 3.3 11/16/2010 0400   VLDL 27 11/16/2010 0400   LDLCALC 72 07/17/2014 0943   LDLCALC 63 03/17/2013 0928   LDLCALC  11/16/2010 0400    53        Total Cholesterol/HDL:CHD Risk Coronary Heart Disease Risk Table                     Men   Women  1/2 Average Risk   3.4   3.3  Average Risk       5.0   4.4  2 X Average Risk   9.6   7.1  3 X Average Risk  23.4   11.0        Use the calculated Patient Ratio above and the CHD Risk Table to determine the patient's CHD Risk.        ATP III CLASSIFICATION (LDL):  <100     mg/dL   Optimal  100-129  mg/dL   Near or Above                    Optimal  130-159  mg/dL   Borderline  160-189  mg/dL   High  >190     mg/dL   Very High      Wt Readings from Last 3 Encounters:  07/30/15 154 lb 3.2 oz (69.945 kg)  03/30/15 151 lb (68.493 kg)  11/23/14 152 lb (68.947 kg)     Cardiac Studies Reviewed: Cardiac Cath 12/16/2013: Procedural Findings:   Hemodynamics:  AO 138/54  LV 132/24  Coronary angiography:  Coronary dominance: right  Left mainstem: total occlusion at the ostium  Left anterior descending (LAD): fills from LIMA  Left circumflex (LCx): fills from SVG  Right coronary artery (RCA): small nondominant. Heavily calcified ostium. Patent throughout.  LIMA - LAD: widely patent throughout. Mid/distal LAD are patent. prox LAD fills retrograde from the graft. There is faint diagonal filling of diffusely diseased vessels in retrograde fashion.  SVG - OM1 and OM2: total occlusion at  aortic anastomosis  SVG - OM3 and LPL: patent stents in mid-body of graft. Mild 40% stenosis at OM3 insertion site. LPL and L PDA fill from this graft  Left ventriculography: Mild anterolateral hypokinesis. LVEF estimated at 45%  Final Conclusions:  1. Severe native 2V CAD with total occlusion of the LM  2. Heavily calcified but patent nondominant RCA  3. S/P CABG with continued patency of the LIMA-LAD and SVG-OM3 and LPL  4. Total occlusion of the SVG-OM1 and OM2  5. Mild segmental LV systolic dysfunction  Recommendations: aggressive medical therapy. I don't think there are any targets for revascularization.  ASSESSMENT AND PLAN: 1. Coronary artery disease, native vessel and autologous bypass graft disease, with angina. Has had episodes of chest tightness and shortness of breath. Cardiac catheterization data reviewed and there are no targets for intervention based on his last catheterization. I have recommended increasing her isosorbide to 60 mg and increasing hydrochlorothiazide to 25 mg daily. Hopefully with increase in antianginal therapy and better control of blood pressure, anginal burden will be reduced.  2. Hypertension. See medication changes as outlined above  3. Hyperlipidemia. The patient takes Crestor 40 mg daily. He is followed by Dr. Laurance Flatten. Most recent labs are reviewed.  4. Carotid artery stenosis. He has chronic occlusion of the left internal carotid artery and moderate stenosis of the right internal carotid artery. He is scheduled to have a follow-up duplex scan in the vascular surgery office in the near future.  5. 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 He has not had  a recent echocardiogram. Will update an echo study and medication changes made as outlined above. If LVEF is reduced by echo, will need to consider ACE/ARB.  Current medicines are reviewed with the patient today.  The  patient does not have concerns regarding medicines.  Labs/ tests ordered today include:  No orders of the defined types were placed in this encounter.   Disposition:   FU one year  Signed, [name], MD  07/30/2015 8:22 AM    [facility] [place], [place], [place]  [address] Phone: [phone]; Fax: [phone]

## 2015-07-31 ENCOUNTER — Encounter: Payer: Self-pay | Admitting: Cardiovascular Disease

## 2015-08-02 ENCOUNTER — Ambulatory Visit (HOSPITAL_COMMUNITY): Payer: Medicare Other | Attending: Cardiovascular Disease

## 2015-08-02 ENCOUNTER — Other Ambulatory Visit: Payer: Self-pay

## 2015-08-02 DIAGNOSIS — I340 Nonrheumatic mitral (valve) insufficiency: Secondary | ICD-10-CM | POA: Insufficient documentation

## 2015-08-02 DIAGNOSIS — C3490 Malignant neoplasm of unspecified part of unspecified bronchus or lung: Secondary | ICD-10-CM | POA: Diagnosis not present

## 2015-08-02 DIAGNOSIS — I10 Essential (primary) hypertension: Secondary | ICD-10-CM

## 2015-08-02 DIAGNOSIS — Z951 Presence of aortocoronary bypass graft: Secondary | ICD-10-CM | POA: Insufficient documentation

## 2015-08-02 DIAGNOSIS — E785 Hyperlipidemia, unspecified: Secondary | ICD-10-CM | POA: Diagnosis not present

## 2015-08-02 DIAGNOSIS — Z923 Personal history of irradiation: Secondary | ICD-10-CM | POA: Diagnosis not present

## 2015-08-02 DIAGNOSIS — Z87891 Personal history of nicotine dependence: Secondary | ICD-10-CM | POA: Insufficient documentation

## 2015-08-02 DIAGNOSIS — R0602 Shortness of breath: Secondary | ICD-10-CM | POA: Insufficient documentation

## 2015-08-06 ENCOUNTER — Ambulatory Visit (INDEPENDENT_AMBULATORY_CARE_PROVIDER_SITE_OTHER): Payer: Medicare Other | Admitting: *Deleted

## 2015-08-06 DIAGNOSIS — E538 Deficiency of other specified B group vitamins: Secondary | ICD-10-CM

## 2015-08-08 ENCOUNTER — Other Ambulatory Visit: Payer: Self-pay | Admitting: Radiation Oncology

## 2015-08-08 DIAGNOSIS — Z85118 Personal history of other malignant neoplasm of bronchus and lung: Secondary | ICD-10-CM

## 2015-08-09 ENCOUNTER — Telehealth: Payer: Self-pay | Admitting: *Deleted

## 2015-08-09 NOTE — Telephone Encounter (Signed)
CALLED PATIENT TO INFORM OF CT AND FU, SPOKE WITH HIS WIFE TERESA AND SHE IS AWARE OF THESE APPTS.

## 2015-08-12 ENCOUNTER — Other Ambulatory Visit: Payer: Self-pay | Admitting: Cardiovascular Disease

## 2015-08-13 ENCOUNTER — Other Ambulatory Visit: Payer: Self-pay | Admitting: Cardiovascular Disease

## 2015-08-15 ENCOUNTER — Ambulatory Visit (HOSPITAL_COMMUNITY)
Admission: RE | Admit: 2015-08-15 | Discharge: 2015-08-15 | Disposition: A | Discharge status: Home / Self Care | Payer: Medicare Other | Source: Ambulatory Visit | Attending: Radiation Oncology | Admitting: Radiation Oncology

## 2015-08-15 DIAGNOSIS — I7090 Unspecified atherosclerosis: Secondary | ICD-10-CM | POA: Diagnosis not present

## 2015-08-15 DIAGNOSIS — J439 Emphysema, unspecified: Secondary | ICD-10-CM | POA: Diagnosis not present

## 2015-08-15 DIAGNOSIS — Z08 Encounter for follow-up examination after completed treatment for malignant neoplasm: Secondary | ICD-10-CM | POA: Diagnosis not present

## 2015-08-15 DIAGNOSIS — C3490 Malignant neoplasm of unspecified part of unspecified bronchus or lung: Secondary | ICD-10-CM | POA: Diagnosis not present

## 2015-08-15 DIAGNOSIS — K5730 Diverticulosis of large intestine without perforation or abscess without bleeding: Secondary | ICD-10-CM | POA: Diagnosis not present

## 2015-08-15 DIAGNOSIS — Z85118 Personal history of other malignant neoplasm of bronchus and lung: Secondary | ICD-10-CM | POA: Diagnosis not present

## 2015-08-15 DIAGNOSIS — Z923 Personal history of irradiation: Secondary | ICD-10-CM | POA: Insufficient documentation

## 2015-08-16 ENCOUNTER — Ambulatory Visit
Admission: RE | Admit: 2015-08-16 | Discharge: 2015-08-16 | Disposition: A | Discharge status: Home / Self Care | Payer: Medicare Other | Source: Ambulatory Visit | Attending: Radiation Oncology | Admitting: Radiation Oncology

## 2015-08-16 VITALS — BP 142/52 | HR 58 | Temp 97.7°F | Resp 14 | Ht 65.0 in | Wt 151.7 lb

## 2015-08-16 DIAGNOSIS — C3411 Malignant neoplasm of upper lobe, right bronchus or lung: Secondary | ICD-10-CM

## 2015-08-16 NOTE — Progress Notes (Signed)
NURSING EVALUATION  Radiation Oncology: Dr. 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    Last radiation: 2010   Recent imaging: CT chest (08/15/15) IMPRESSION: 1. A cause for left upper chest pain is not identified. 2. Aortoiliac atherosclerotic vascular disease. 3. Stable consolidation at the right lung apex compatible with prior radiation therapy. 4. Emphysema. 5. Transverse colon diverticula.   Tobacco/Marijuana/Snuff/ETOH use: none currently  Signs/Symptoms  Weight changes, if any: No  Respiratory complaints, if any: No  Dysphagia, if any: No  Hemoptysis, if any: No  Pain issues, if any: Yes, left upper chest-worse with walking.   Vital Signs:  Filed Vitals:   08/16/15 0800  BP: 142/52  Pulse: 58  Temp: 97.7 F (36.5 C)  Resp: 14   Weight:  Filed Weights   08/16/15 0800  Weight: 151 lb 11.2 oz (68.811 kg)     Current Complaints / other details:  [name] is here for routine follow-up following CT scan for reported left upper chest pain. His left upper chest pain is worse with ambulation ("Sometimes I have to stop walking it hurts so bad.")  He has taken 2 "pain pills" today which helps control his pain. Current pain 0/10 at rest. He has close follow-up with Family Medicine (Dr. Redge Gainer) and Cardiology (Dr. [name]).    Mike Craze, NP 08/16/15

## 2015-08-16 NOTE — Progress Notes (Signed)
Department of Radiation Oncology  Phone:  [phone] Fax:        [phone]   Name: [name] MRN: [id number]  DOB: [birth year]  Date: 08/16/2015  Follow Up Visit Note  Diagnosis: T2N0 Adenocarcinoma of the right upper lobe  Summary and Interval since last radiation: 5 years from 70 Gy completed May 2010 (chemo held after MI)  Interval History: [name] presents today for routine follow up following CT scan for reported left upper chest pain. He has some left chest pain after a recent cardiac procedure. He associates this with the cardiologist performing the cath through his left arm. He has spoken to Dr. Burt Knack about this. He was concerned this could be from recurrent cancer and requested ct scan and follow up. Had CT of chest yesterday which showed stable radiation change in the right upper lobe. No evidence of metastatic or recurrent disease.  Allergies:  Allergies  Allergen Reactions  . Demerol Nausea And Vomiting  . Clopidogrel Bisulfate     Severe rash  . Meperidine Hcl     Unknown, per pt   . Penicillins     Rash   . Propoxyphene N-Acetaminophen     nauesa    Medications:  Current Outpatient Prescriptions  Medication Sig Dispense Refill  . ALPRAZolam (XANAX) 1 MG tablet TAKE 1 TABLET 3 TIMES A DAY AS NEEDED FOR ANXIETY 90 tablet 1  . amLODipine (NORVASC) 10 MG tablet Take 10 mg by mouth daily.    Marland Kitchen amLODipine (NORVASC) 10 MG tablet TAKE 1 TABLET EVERY DAY 30 tablet 11  . aspirin EC 81 MG tablet Take 1 tablet (81 mg total) by mouth daily. 30 tablet 4  . fenofibrate 160 MG tablet TAKE 1 TABLET (160 MG TOTAL) BY MOUTH DAILY. 30 tablet 4  . gabapentin (NEURONTIN) 300 MG capsule TAKE 1 CAPSULE (300 MG TOTAL) BY MOUTH EVERY 8 (EIGHT) HOURS. 270 capsule 3  . hydrochlorothiazide (MICROZIDE) 12.5 MG capsule Take 2 capsules (25 mg total) by mouth daily. 60 capsule 11  . HYDROcodone-acetaminophen (NORCO) 10-325 MG tablet Take 1 tablet by mouth every 6 (six) hours  as needed for moderate pain. 120 tablet 0  . isosorbide mononitrate (IMDUR) 60 MG 24 hr tablet Take 1 tablet (60 mg total) by mouth daily. 30 tablet 11  . KLOR-CON M20 20 MEQ tablet TAKE 1 TABLET (20 MEQ TOTAL) BY MOUTH DAILY. 30 tablet 11  . metoprolol (LOPRESSOR) 50 MG tablet TAKE 1 TABLET (50 MG TOTAL) BY MOUTH 2 (TWO) TIMES DAILY. 180 tablet 2  . Multiple Vitamin (MULTIVITAMIN) capsule Take 1 capsule by mouth daily.      . nitroGLYCERIN (NITROSTAT) 0.4 MG SL tablet Place 0.4 mg under the tongue every 5 (five) minutes as needed. After 3 doses and no relief call 911    . prasugrel (EFFIENT) 10 MG TABS tablet Take 10 mg by mouth daily.    . rosuvastatin (CRESTOR) 40 MG tablet Take 20 mg by mouth daily.     Current Facility-Administered Medications  Medication Dose Route Frequency Provider Last Rate Last Dose  . cyanocobalamin ((VITAMIN B-12)) injection 1,000 mcg  1,000 mcg Intramuscular Q30 days Wardell Honour, MD   1,000 mcg at 08/06/15 1106    Physical Exam:  Filed Vitals:   08/16/15 0800  BP: 142/52  Pulse: 58  Temp: 97.7 F (36.5 C)  Resp: 14   No oxygen. No respiratory distress. ECOG 1.   IMPRESSION: [name] is a 69 y.o. male s/p  RT alone for stage II Lung cancer with NED at 6 years out from treatment.  PLAN: Patient will follow up in 1 year with a non-contrast CT of the chest. He will continue to monitor his left chest pain and report to Dr. Burt Knack.    Thea Silversmith, MD   This document serves as a record of services personally performed by Thea Silversmith, MD. It was created on her behalf by  Lendon Collar, a trained medical scribe. The creation of this record is based on the scribe's personal observations and the provider's statements to them. This document has been checked and approved by the attending provider.

## 2015-08-17 ENCOUNTER — Telehealth: Payer: Self-pay | Admitting: *Deleted

## 2015-08-17 NOTE — Telephone Encounter (Signed)
CALLED PATIENT TO INFORM OF CT ON 08-13-16- ARRIVAL TIME - 9:45 AM @ [facility] AND HIS FU WITH DR. Pablo Ledger FOR RESULTS ON 08-14-16 @ 1 PM, SPOKE WITH [name] AND HE IS AWARE OF THESE APPTS.

## 2015-08-21 ENCOUNTER — Encounter: Payer: Self-pay | Admitting: Family Medicine

## 2015-08-21 ENCOUNTER — Ambulatory Visit (INDEPENDENT_AMBULATORY_CARE_PROVIDER_SITE_OTHER): Payer: Medicare Other | Admitting: Family Medicine

## 2015-08-21 VITALS — BP 122/59 | HR 60 | Temp 97.1°F | Ht 65.0 in | Wt 150.4 lb

## 2015-08-21 DIAGNOSIS — Z85118 Personal history of other malignant neoplasm of bronchus and lung: Secondary | ICD-10-CM | POA: Diagnosis not present

## 2015-08-21 DIAGNOSIS — N400 Enlarged prostate without lower urinary tract symptoms: Secondary | ICD-10-CM

## 2015-08-21 DIAGNOSIS — I739 Peripheral vascular disease, unspecified: Secondary | ICD-10-CM | POA: Diagnosis not present

## 2015-08-21 DIAGNOSIS — E785 Hyperlipidemia, unspecified: Secondary | ICD-10-CM

## 2015-08-21 DIAGNOSIS — I6523 Occlusion and stenosis of bilateral carotid arteries: Secondary | ICD-10-CM | POA: Diagnosis not present

## 2015-08-21 DIAGNOSIS — J701 Chronic and other pulmonary manifestations due to radiation: Secondary | ICD-10-CM

## 2015-08-21 DIAGNOSIS — E538 Deficiency of other specified B group vitamins: Secondary | ICD-10-CM | POA: Diagnosis not present

## 2015-08-21 DIAGNOSIS — E559 Vitamin D deficiency, unspecified: Secondary | ICD-10-CM

## 2015-08-21 DIAGNOSIS — I10 Essential (primary) hypertension: Secondary | ICD-10-CM

## 2015-08-21 DIAGNOSIS — Z23 Encounter for immunization: Secondary | ICD-10-CM | POA: Diagnosis not present

## 2015-08-21 DIAGNOSIS — D696 Thrombocytopenia, unspecified: Secondary | ICD-10-CM

## 2015-08-21 MED ORDER — HYDROCODONE-ACETAMINOPHEN 10-325 MG PO TABS: 1.0000 | ORAL_TABLET | Freq: Four times a day (QID) | ORAL | Status: DC | PRN

## 2015-08-21 NOTE — Patient Instructions (Addendum)
Medicare Annual Wellness Visit  [facility] and the medical providers at [facility] strive to bring you the best medical care.  In doing so we not only want to address your current medical conditions and concerns but also to detect new conditions early and prevent illness, disease and health-related problems.    Medicare offers a yearly Wellness Visit which allows our clinical staff to assess your need for preventative services including immunizations, lifestyle education, counseling to decrease risk of preventable diseases and screening for fall risk and other medical concerns.    This visit is provided free of charge (no copay) for all Medicare recipients. The clinical pharmacists at [facility] have begun to conduct these Wellness Visits which will also include a thorough review of all your medications.    As you primary medical provider recommend that you make an appointment for your Annual Wellness Visit if you have not done so already this year.  You may set up this appointment before you leave today or you may call back ([phone]) and schedule an appointment.  Please make sure when you call that you mention that you are scheduling your Annual Wellness Visit with the clinical pharmacist so that the appointment may be made for the proper length of time.     Continue current medications. Continue good therapeutic lifestyle changes which include good diet and exercise. Fall precautions discussed with patient. If an FOBT was given today- please return it to our front desk. If you are over 17 years old - you may need Prevnar 5 or the adult Pneumonia vaccine.  **Flu shots are available--- please call and schedule a FLU-CLINIC appointment**  After your visit with [place] today you will receive a survey in the mail or online from [employer] regarding your care with [place]. Please take a moment to fill this out. Your feedback is very  important to [place] as you can help [place] better understand your patient needs as well as improve your experience and satisfaction. WE CARE ABOUT YOU!!!   The patient should continue to follow up with his radiation oncologist his cardiologist and get his carotid Dopplers done as planned. He should stay as active as possible and watch his sodium intake

## 2015-08-21 NOTE — Progress Notes (Signed)
Subjective:    Patient ID: [name], male    DOB: 1945-11-21, 69 y.o.   MRN: [id number]  HPI Pt here for follow up and management of chronic medical problems including hypertension and hyperlipidemia, patient taking medication.  The patient sees the cardiologist, Dr. Burt Knack, every 6 months. He recently changed some of the medication that the patient was taking and he also did an echocardiogram. He also sees the radiation oncologist because of the history of lung cancer and this is also yearly. He recently had a CT scan which was stable. The patient is due to get his flu shot today and lab work today. He stays active and does not have any chest pain with the activity level but he works in. He is scheduled for carotid Dopplers so and and he has bilateral carotid bruits. The patient denies chest pain shortness of breath trouble swallowing and heartburn indigestion nausea vomiting diarrhea or blood in the stool. He is also did not have any problems passing his water. He does have some pain and discomfort in both legs with walking and he attributes this to having had chemotherapy.   Patient Active Problem List   Diagnosis Date Noted  . Right carotid bruit 11/23/2014  . Claudication ([place]) 11/23/2014  . Thrombocytopenia ([place]) 07/17/2014  . Radiation fibrosis of lung ([place]) 07/17/2014  . Peripheral vascular insufficiency ([place]) 07/17/2014  . Peripheral neuropathy due to chemotherapy ([place]) 07/17/2014  . Allergic rhinitis due to pollen 07/17/2014  . Adenocarcinoma of the right upper lobe 03/09/2014  . BPH (benign prostatic hyperplasia) 03/09/2014  . Need for prophylactic vaccination and inoculation against influenza 09/24/2013  . Hyperlipemia 09/24/2013  . Ecchymosis 09/24/2013  . Mild eczema 09/24/2013  . Chronic pain syndrome 06/21/2013  . Anxiety state, unspecified 03/17/2013  . FEMORAL BRUIT 12/04/2010  . HYPERTENSION, BENIGN 12/13/2008  . CAD, ARTERY BYPASS GRAFT 12/13/2008  . CAROTID  ARTERY STENOSIS, WITHOUT INFARCTION 12/13/2008  . MYOCARDIAL INFARCTION 10/16/2008  . Swelling, mass, or lump in chest 10/16/2008   Outpatient Encounter Prescriptions as of 08/21/2015  Medication Sig  . ALPRAZolam (XANAX) 1 MG tablet TAKE 1 TABLET 3 TIMES A DAY AS NEEDED FOR ANXIETY  . amLODipine (NORVASC) 10 MG tablet TAKE 1 TABLET EVERY DAY  . aspirin EC 81 MG tablet Take 1 tablet (81 mg total) by mouth daily.  . fenofibrate 160 MG tablet TAKE 1 TABLET (160 MG TOTAL) BY MOUTH DAILY.  Marland Kitchen gabapentin (NEURONTIN) 300 MG capsule TAKE 1 CAPSULE (300 MG TOTAL) BY MOUTH EVERY 8 (EIGHT) HOURS.  . hydrochlorothiazide (MICROZIDE) 12.5 MG capsule Take 2 capsules (25 mg total) by mouth daily.  Marland Kitchen HYDROcodone-acetaminophen (NORCO) 10-325 MG tablet Take 1 tablet by mouth every 6 (six) hours as needed for moderate pain.  . isosorbide mononitrate (IMDUR) 60 MG 24 hr tablet Take 1 tablet (60 mg total) by mouth daily.  Marland Kitchen KLOR-CON M20 20 MEQ tablet TAKE 1 TABLET (20 MEQ TOTAL) BY MOUTH DAILY.  . metoprolol (LOPRESSOR) 50 MG tablet TAKE 1 TABLET (50 MG TOTAL) BY MOUTH 2 (TWO) TIMES DAILY.  . Multiple Vitamin (MULTIVITAMIN) capsule Take 1 capsule by mouth daily.    . nitroGLYCERIN (NITROSTAT) 0.4 MG SL tablet Place 0.4 mg under the tongue every 5 (five) minutes as needed. After 3 doses and no relief call 911  . prasugrel (EFFIENT) 10 MG TABS tablet Take 10 mg by mouth daily.  . rosuvastatin (CRESTOR) 40 MG tablet Take 20 mg by mouth daily.  . [  DISCONTINUED] amLODipine (NORVASC) 10 MG tablet Take 10 mg by mouth daily.  . [DISCONTINUED] HYDROcodone-acetaminophen (NORCO) 10-325 MG tablet Take 1 tablet by mouth every 6 (six) hours as needed for moderate pain.   Facility-Administered Encounter Medications as of 08/21/2015  Medication  . cyanocobalamin ((VITAMIN B-12)) injection 1,000 mcg        Review of Systems  Constitutional: Negative.   HENT: Negative.   Eyes: Negative.   Respiratory: Negative.     Cardiovascular: Negative.   Gastrointestinal: Negative.   Endocrine: Negative.   Genitourinary: Negative.   Musculoskeletal: Negative.   Skin: Negative.   Allergic/Immunologic: Negative.   Neurological: Negative.   Hematological: Negative.   Psychiatric/Behavioral: Negative.        Objective:   Physical Exam  Constitutional: He is oriented to person, place, and time. He appears well-developed and well-nourished. No distress.  HENT:  Head: Normocephalic and atraumatic.  Right Ear: External ear normal.  Left Ear: External ear normal.  Nose: Nose normal.  Mouth/Throat: No oropharyngeal exudate.  Poor oral hygiene with multiple cavities.  Eyes: Conjunctivae and EOM are normal. Pupils are equal, round, and reactive to light. Right eye exhibits no discharge. Left eye exhibits no discharge. No scleral icterus.  Neck: Normal range of motion. Neck supple. No thyromegaly present.  He has bilateral carotid bruits and supraclavicular bruit on the right. He is scheduled for carotid Dopplers done.  Cardiovascular: Normal rate, regular rhythm, normal heart sounds and intact distal pulses.  Exam reveals no gallop and no friction rub.   No murmur heard. The rhythm is regular at 60/m. He has a good posterior tibial on the right but there was no dorsalis pedis or posterior tibial on the left palpable. It was difficult to palpate the femoral arteries on either side.  Pulmonary/Chest: Effort normal and breath sounds normal. No respiratory distress. He has no wheezes. He has no rales. He exhibits no tenderness.  Abdominal: Soft. Bowel sounds are normal. He exhibits no mass. There is no tenderness. There is no rebound and no guarding.  Musculoskeletal: Normal range of motion. He exhibits no edema or tenderness.  Lymphadenopathy:    He has no cervical adenopathy.  Neurological: He is alert and oriented to person, place, and time. He has normal reflexes. No cranial nerve deficit.  Skin: Skin is warm and  dry. No rash noted.  Psychiatric: He has a normal mood and affect. His behavior is normal. Judgment and thought content normal.  Nursing note and vitals reviewed.  BP 149/61 mmHg  Pulse 60  Temp(Src) 97.1 F (36.2 C) (Oral)  Ht 5' 5"  (1.651 m)  Wt 150 lb 6.4 oz (68.221 kg)  BMI 25.03 kg/m2        Assessment & Plan:  1. Hyperlipemia -Continue with Crestor and fenofibrate pending results of lab work - CBC with Differential/Platelet - NMR, lipoprofile  2. Vitamin B12 deficiency -We will get a B12 level today to make sure that his B12 is within normal limits. - CBC with Differential/Platelet  3. BPH (benign prostatic hyperplasia) -The patient is having no symptoms with passing his water. - CBC with Differential/Platelet  4. HYPERTENSION, BENIGN -The blood pressure was slightly elevated today and there was no change in treatment he will continue to watch his sodium intake - BMP8+EGFR - CBC with Differential/Platelet - Hepatic function panel  5. Vitamin D deficiency -Continue current diet and we will check a vitamin D level pending results of lab work and treatment is necessary - CBC  with Differential/Platelet - VITAMIN D 25 Hydroxy (Vit-D Deficiency, Fractures)  6. Thrombocytopenia ([facility]) -No bleeding problems.  7. Peripheral vascular insufficiency ([facility]) -The patient still has some pain in his legs with walking.  8. Radiation fibrosis of lung ([place]) -Continue to follow-up with radiation oncologist  9. Claudication [facility]) -Continue to follow up with vascular surgeon and cardiology  10. Adenocarcinoma of the right upper lobe -Continue to follow-up with radiation oncologist  . Patient Instructions                       Medicare Annual Wellness Visit  [facility] and the medical providers at [facility] strive to bring you the best medical care.  In doing so we not only want to address your current medical conditions and concerns but also to  detect new conditions early and prevent illness, disease and health-related problems.    Medicare offers a yearly Wellness Visit which allows our clinical staff to assess your need for preventative services including immunizations, lifestyle education, counseling to decrease risk of preventable diseases and screening for fall risk and other medical concerns.    This visit is provided free of charge (no copay) for all Medicare recipients. The clinical pharmacists at [facility] have begun to conduct these Wellness Visits which will also include a thorough review of all your medications.    As you primary medical provider recommend that you make an appointment for your Annual Wellness Visit if you have not done so already this year.  You may set up this appointment before you leave today or you may call back ([phone]) and schedule an appointment.  Please make sure when you call that you mention that you are scheduling your Annual Wellness Visit with the clinical pharmacist so that the appointment may be made for the proper length of time.     Continue current medications. Continue good therapeutic lifestyle changes which include good diet and exercise. Fall precautions discussed with patient. If an FOBT was given today- please return it to our front desk. If you are over 62 years old - you may need Prevnar 71 or the adult Pneumonia vaccine.  **Flu shots are available--- please call and schedule a FLU-CLINIC appointment**  After your visit with [place] today you will receive a survey in the mail or online from [employer] regarding your care with [place]. Please take a moment to fill this out. Your feedback is very important to [place] as you can help [place] better understand your patient needs as well as improve your experience and satisfaction. WE CARE ABOUT YOU!!!   The patient should continue to follow up with his radiation oncologist his cardiologist and get his carotid Dopplers done as  planned. He should stay as active as possible and watch his sodium intake   Arrie Senate MD

## 2015-08-22 ENCOUNTER — Other Ambulatory Visit: Payer: Self-pay | Admitting: Cardiovascular Disease

## 2015-08-22 DIAGNOSIS — I6523 Occlusion and stenosis of bilateral carotid arteries: Secondary | ICD-10-CM

## 2015-08-22 LAB — HEPATIC FUNCTION PANEL
ALK PHOS: 46 IU/L (ref 39–117)
ALT: 13 IU/L (ref 0–44)
AST: 12 IU/L (ref 0–40)
Albumin: 4.5 g/dL (ref 3.6–4.8)
BILIRUBIN, DIRECT: 0.13 mg/dL (ref 0.00–0.40)
Bilirubin Total: 0.4 mg/dL (ref 0.0–1.2)
Total Protein: 6.6 g/dL (ref 6.0–8.5)

## 2015-08-22 LAB — CBC WITH DIFFERENTIAL/PLATELET
BASOS: 1 %
Basophils Absolute: 0 10*3/uL (ref 0.0–0.2)
EOS (ABSOLUTE): 0.1 10*3/uL (ref 0.0–0.4)
EOS: 2 %
HEMOGLOBIN: 12.9 g/dL (ref 12.6–17.7)
Hematocrit: 38.3 % (ref 37.5–51.0)
IMMATURE GRANS (ABS): 0 10*3/uL (ref 0.0–0.1)
Immature Granulocytes: 0 %
LYMPHS: 13 %
Lymphocytes Absolute: 0.6 10*3/uL — ABNORMAL LOW (ref 0.7–3.1)
MCH: 29.3 pg (ref 26.6–33.0)
MCHC: 33.7 g/dL (ref 31.5–35.7)
MCV: 87 fL (ref 79–97)
MONOCYTES: 9 %
Monocytes Absolute: 0.4 10*3/uL (ref 0.1–0.9)
NEUTROS ABS: 3.6 10*3/uL (ref 1.4–7.0)
Neutrophils: 75 %
Platelets: 139 10*3/uL — ABNORMAL LOW (ref 150–379)
RBC: 4.41 x10E6/uL (ref 4.14–5.80)
RDW: 13.3 % (ref 12.3–15.4)
WBC: 4.8 10*3/uL (ref 3.4–10.8)

## 2015-08-22 LAB — BMP8+EGFR
BUN/Creatinine Ratio: 13 (ref 10–22)
BUN: 10 mg/dL (ref 8–27)
CALCIUM: 9.2 mg/dL (ref 8.6–10.2)
CO2: 23 mmol/L (ref 18–29)
Chloride: 103 mmol/L (ref 97–106)
Creatinine, Ser: 0.8 mg/dL (ref 0.76–1.27)
GFR calc Af Amer: 105 mL/min/{1.73_m2} (ref >59)
GFR calc non Af Amer: 91 mL/min/{1.73_m2} (ref >59)
Glucose: 98 mg/dL (ref 65–99)
POTASSIUM: 4.1 mmol/L (ref 3.5–5.2)
Sodium: 140 mmol/L (ref 136–144)

## 2015-08-22 LAB — NMR, LIPOPROFILE
CHOLESTEROL: 179 mg/dL (ref 100–199)
HDL Cholesterol by NMR: 39 mg/dL — ABNORMAL LOW (ref >39)
HDL Particle Number: 32.4 umol/L (ref >=30.5)
LDL PARTICLE NUMBER: 1364 nmol/L — AB (ref <1000)
LDL SIZE: 20.6 nm (ref >20.5)
LDL-C: 109 mg/dL — ABNORMAL HIGH (ref 0–99)
LP-IR SCORE: 76 — AB (ref <=45)
Small LDL Particle Number: 591 nmol/L — ABNORMAL HIGH (ref <=527)
Triglycerides by NMR: 157 mg/dL — ABNORMAL HIGH (ref 0–149)

## 2015-08-22 LAB — VITAMIN D 25 HYDROXY (VIT D DEFICIENCY, FRACTURES): Vit D, 25-Hydroxy: 33.1 ng/mL (ref 30.0–100.0)

## 2015-08-27 ENCOUNTER — Ambulatory Visit (HOSPITAL_COMMUNITY)
Admission: RE | Admit: 2015-08-27 | Discharge: 2015-08-27 | Disposition: A | Discharge status: Home / Self Care | Payer: Medicare Other | Source: Ambulatory Visit | Attending: Cardiology | Admitting: Cardiology

## 2015-08-27 DIAGNOSIS — E785 Hyperlipidemia, unspecified: Secondary | ICD-10-CM | POA: Insufficient documentation

## 2015-08-27 DIAGNOSIS — I10 Essential (primary) hypertension: Secondary | ICD-10-CM | POA: Diagnosis not present

## 2015-08-27 DIAGNOSIS — I6523 Occlusion and stenosis of bilateral carotid arteries: Secondary | ICD-10-CM | POA: Diagnosis not present

## 2015-08-29 ENCOUNTER — Other Ambulatory Visit: Payer: Self-pay

## 2015-08-29 DIAGNOSIS — I6523 Occlusion and stenosis of bilateral carotid arteries: Secondary | ICD-10-CM

## 2015-08-29 NOTE — Progress Notes (Signed)
Pt scheduled for CT angio of neck Monday at 2:00, arrive 1:45. The pt should be NPO 2 hours prior to appointment. Pt's wife aware of appointment.

## 2015-09-03 ENCOUNTER — Ambulatory Visit (INDEPENDENT_AMBULATORY_CARE_PROVIDER_SITE_OTHER)
Admission: RE | Admit: 2015-09-03 | Discharge: 2015-09-03 | Disposition: A | Discharge status: Home / Self Care | Payer: Medicare Other | Source: Ambulatory Visit | Attending: Cardiovascular Disease | Admitting: Cardiovascular Disease

## 2015-09-03 DIAGNOSIS — I6523 Occlusion and stenosis of bilateral carotid arteries: Secondary | ICD-10-CM

## 2015-09-03 MED ORDER — IOHEXOL 350 MG/ML SOLN
80.0000 mL | Freq: Once | INTRAVENOUS | Status: AC | PRN
  Administered 2015-09-03: 80 mL via INTRAVENOUS

## 2015-09-04 ENCOUNTER — Other Ambulatory Visit: Payer: Self-pay | Admitting: Family Medicine

## 2015-09-06 ENCOUNTER — Ambulatory Visit (INDEPENDENT_AMBULATORY_CARE_PROVIDER_SITE_OTHER): Payer: Medicare Other | Admitting: *Deleted

## 2015-09-06 DIAGNOSIS — E538 Deficiency of other specified B group vitamins: Secondary | ICD-10-CM | POA: Diagnosis not present

## 2015-09-06 NOTE — Patient Instructions (Signed)

## 2015-09-06 NOTE — Progress Notes (Signed)
Vitamin b12 injection given and tolerated well.  

## 2015-09-17 ENCOUNTER — Telehealth: Payer: Self-pay | Admitting: Family Medicine

## 2015-09-18 ENCOUNTER — Ambulatory Visit (INDEPENDENT_AMBULATORY_CARE_PROVIDER_SITE_OTHER): Payer: Medicare Other | Admitting: Cardiovascular Disease

## 2015-09-18 ENCOUNTER — Encounter: Payer: Self-pay | Admitting: Cardiovascular Disease

## 2015-09-18 VITALS — BP 142/64 | HR 65 | Ht 65.0 in | Wt 152.0 lb

## 2015-09-18 DIAGNOSIS — I6521 Occlusion and stenosis of right carotid artery: Secondary | ICD-10-CM | POA: Diagnosis not present

## 2015-09-18 DIAGNOSIS — I6523 Occlusion and stenosis of bilateral carotid arteries: Secondary | ICD-10-CM

## 2015-09-18 DIAGNOSIS — R5383 Other fatigue: Secondary | ICD-10-CM

## 2015-09-18 MED ORDER — HYDROCODONE-ACETAMINOPHEN 10-325 MG PO TABS: 1.0000 | ORAL_TABLET | Freq: Four times a day (QID) | ORAL | Status: DC | PRN

## 2015-09-18 MED ORDER — ALPRAZOLAM 1 MG PO TABS: ORAL_TABLET | ORAL | Status: DC

## 2015-09-18 NOTE — Patient Instructions (Signed)
DR. Kennon Holter office will call you to talk through the details of your Carotid stent procedure.

## 2015-09-18 NOTE — Progress Notes (Signed)
09/18/2015 [name]   12-07-1945  [id number]  Primary Physician Redge Gainer, MD Primary Cardiologist: Lorretta Harp MD Renae Gloss   HPI:  [name] is a delightful 69 year old married Caucasian male who is a copy by his wife [name] today. He is referred through the courtesy of Dr. Burt Knack for peripheral vascular evaluation because of asymptomatic high-grade right internal carotid artery stenosis. He has a long history of ischemic heart disease status post remote coronary artery bypass grafting with multiple PCI and stent procedures since that time. He does have hypertension, hyperlipidemia. Dr. Burt Knack has been following his carotid Dopplers which have demonstrated an occluded left carotid with high-grade right internal carotid endarterectomy restenosis. He is asymptomatic. Given his comorbidities, ischemic heart disease and the fact that he is on Trileptal up a therapy in addition to the fact that he has endarterectomy restenosis Dr. Burt Knack felt he was a more appropriate candidate for carotid artery stenting.   Current Outpatient Prescriptions  Medication Sig Dispense Refill  . ALPRAZolam (XANAX) 1 MG tablet TAKE 1 TABLET 3 TIMES A DAY AS NEEDED FOR ANXIETY 90 tablet 1  . amLODipine (NORVASC) 10 MG tablet TAKE 1 TABLET EVERY DAY 30 tablet 11  . aspirin EC 81 MG tablet Take 1 tablet (81 mg total) by mouth daily. 30 tablet 4  . EFFIENT 10 MG TABS tablet TAKE 1 TABLET EVERY DAY 30 tablet 2  . fenofibrate 160 MG tablet TAKE 1 TABLET (160 MG TOTAL) BY MOUTH DAILY. 30 tablet 4  . gabapentin (NEURONTIN) 300 MG capsule TAKE 1 CAPSULE (300 MG TOTAL) BY MOUTH EVERY 8 (EIGHT) HOURS. 270 capsule 3  . hydrochlorothiazide (MICROZIDE) 12.5 MG capsule Take 2 capsules (25 mg total) by mouth daily. 60 capsule 11  . HYDROcodone-acetaminophen (NORCO) 10-325 MG tablet Take 1 tablet by mouth every 6 (six) hours as needed for moderate pain. 120 tablet 0  . isosorbide mononitrate  (IMDUR) 60 MG 24 hr tablet Take 1 tablet (60 mg total) by mouth daily. 30 tablet 11  . KLOR-CON M20 20 MEQ tablet TAKE 1 TABLET (20 MEQ TOTAL) BY MOUTH DAILY. 30 tablet 11  . metoprolol (LOPRESSOR) 50 MG tablet TAKE 1 TABLET (50 MG TOTAL) BY MOUTH 2 (TWO) TIMES DAILY. 180 tablet 2  . Multiple Vitamin (MULTIVITAMIN) capsule Take 1 capsule by mouth daily.      . nitroGLYCERIN (NITROSTAT) 0.4 MG SL tablet Place 0.4 mg under the tongue every 5 (five) minutes as needed. After 3 doses and no relief call 911    . prasugrel (EFFIENT) 10 MG TABS tablet Take 10 mg by mouth daily.    . rosuvastatin (CRESTOR) 40 MG tablet Take 20 mg by mouth daily.     Current Facility-Administered Medications  Medication Dose Route Frequency Provider Last Rate Last Dose  . cyanocobalamin ((VITAMIN B-12)) injection 1,000 mcg  1,000 mcg Intramuscular Q30 days Wardell Honour, MD   1,000 mcg at 09/06/15 2637    Allergies  Allergen Reactions  . Demerol Nausea And Vomiting  . Clopidogrel Bisulfate     Severe rash  . Meperidine Hcl     Unknown, per pt   . Penicillins     Rash   . Propoxyphene N-Acetaminophen     nauesa    Social History   Social History  . Marital Status: Married    Spouse Name: N/A  . Number of Children: N/A  . Years of Education: N/A   Occupational History  .  Not on file.   Social History Main Topics  . Smoking status: Former Research scientist (life sciences)  . Smokeless tobacco: Not on file  . Alcohol Use: No  . Drug Use: No  . Sexual Activity: Not on file   Other Topics Concern  . Not on file   Social History Narrative     Review of Systems: General: negative for chills, fever, night sweats or weight changes.  Cardiovascular: negative for chest pain, dyspnea on exertion, edema, orthopnea, palpitations, paroxysmal nocturnal dyspnea or shortness of breath Dermatological: negative for rash Respiratory: negative for cough or wheezing Urologic: negative for hematuria Abdominal: negative for nausea,  vomiting, diarrhea, bright red blood per rectum, melena, or hematemesis Neurologic: negative for visual changes, syncope, or dizziness All other systems reviewed and are otherwise negative except as noted above.    Blood pressure 142/64, pulse 65, height '5\' 5"'$  (1.651 m), weight 152 lb (68.947 kg).  General appearance: alert and no distress Neck: no adenopathy, no JVD, supple, symmetrical, trachea midline, thyroid not enlarged, symmetric, no tenderness/mass/nodules and bilateral carotid bruits Lungs: clear to auscultation bilaterally Heart: regular rate and rhythm, S1, S2 normal, no murmur, click, rub or gallop Extremities: extremities normal, atraumatic, no cyanosis or edema  EKG not performed today  ASSESSMENT AND PLAN:   CAROTID ARTERY STENOSIS, WITHOUT INFARCTION [name] was referred to me by Dr. Burt Knack for evaluation and treatment of carotid artery disease. He is a 69 year old gentleman with a remote history of right carotid endarterectomy back in 1998. He has a known occluded left carotid artery. He does have a history of ischemic heart disease with mild LV dysfunction and is on dual antiplatelet therapy. Recent Dopplers show shown an occluded left carotid with high grade right carotid endarterectomy restenosis. The patient is deemed a "high risk" surgical candidate by Dr. Trula Slade which I agree with and feel he would be a acceptable candidate for carotid artery stenting.      Lorretta Harp MD FACP,FACC,FAHA, [facility] 09/18/2015 3:24 PM

## 2015-09-18 NOTE — Telephone Encounter (Signed)
This was yesterday

## 2015-09-18 NOTE — Assessment & Plan Note (Signed)
Mr. Landess was referred to me by Dr. Burt Knack for evaluation and treatment of carotid artery disease. He is a 69 year old gentleman with a remote history of right carotid endarterectomy back in 1998. He has a known occluded left carotid artery. He does have a history of ischemic heart disease with mild LV dysfunction and is on dual antiplatelet therapy. Recent Dopplers show shown an occluded left carotid with high grade right carotid endarterectomy restenosis. The patient is deemed a "high risk" surgical candidate by Dr. Trula Slade which I agree with and feel he would be a acceptable candidate for carotid artery stenting.

## 2015-09-18 NOTE — Telephone Encounter (Signed)
Pt aware.

## 2015-09-19 ENCOUNTER — Telehealth: Payer: Self-pay | Admitting: *Deleted

## 2015-09-19 NOTE — Telephone Encounter (Signed)
Spoke with Mrs. Grossberg regarding the procedure scheduled for Mr. Maroney 1./24/17 @ 7:30 am at [place] time at Short Stay 5:30 am--NPO after midnight--labs and x-ray to be completed the week of 10/22/15.  Plan for overnight stay.  Mrs. Monceaux voiced her understanding.  She also stated if he has any more issues before the procedure can be done, she will call 911.

## 2015-09-19 NOTE — Telephone Encounter (Signed)
Spoke with Louis at 8:12 am regarding carotid stent scheduled for 10/30/15 at 7:30 am with Dr. Gwenlyn Found and Dr. Trula Slade.  I also emailed Jeri Modena the information.  Louis voiced his understanding.

## 2015-10-03 ENCOUNTER — Other Ambulatory Visit: Payer: Self-pay | Admitting: Family Medicine

## 2015-10-09 ENCOUNTER — Ambulatory Visit (INDEPENDENT_AMBULATORY_CARE_PROVIDER_SITE_OTHER): Payer: Medicare Other | Admitting: *Deleted

## 2015-10-09 DIAGNOSIS — E538 Deficiency of other specified B group vitamins: Secondary | ICD-10-CM | POA: Diagnosis not present

## 2015-10-09 NOTE — Patient Instructions (Signed)

## 2015-10-09 NOTE — Progress Notes (Signed)
Vitamin B12 injection given and tolerated well   

## 2015-10-22 ENCOUNTER — Other Ambulatory Visit: Payer: Self-pay | Admitting: Family Medicine

## 2015-10-22 MED ORDER — ALPRAZOLAM 1 MG PO TABS: ORAL_TABLET | ORAL | Status: DC

## 2015-10-22 MED ORDER — HYDROCODONE-ACETAMINOPHEN 10-325 MG PO TABS: 1.0000 | ORAL_TABLET | Freq: Four times a day (QID) | ORAL | Status: DC | PRN

## 2015-10-22 NOTE — Telephone Encounter (Signed)
rx waiting on Moore to sign.

## 2015-10-22 NOTE — Telephone Encounter (Signed)
Please remind this patient that in the future we will only be elderly give one of these 2 medicines and that he should start working on reducing the frequency and the strength as soon as possible. He can reduce the pain medicine by taking only one half and can reduce the Xanax but only taking one half and then in the future we can stop one or the other as we will not be able to prescribe both at one time

## 2015-10-24 ENCOUNTER — Ambulatory Visit
Admission: RE | Admit: 2015-10-24 | Discharge: 2015-10-24 | Disposition: A | Discharge status: Home / Self Care | Payer: Medicare Other | Source: Ambulatory Visit | Attending: Cardiovascular Disease | Admitting: Cardiovascular Disease

## 2015-10-24 DIAGNOSIS — I6529 Occlusion and stenosis of unspecified carotid artery: Secondary | ICD-10-CM | POA: Diagnosis not present

## 2015-10-24 DIAGNOSIS — R5383 Other fatigue: Secondary | ICD-10-CM | POA: Diagnosis not present

## 2015-10-24 DIAGNOSIS — I6521 Occlusion and stenosis of right carotid artery: Secondary | ICD-10-CM | POA: Diagnosis not present

## 2015-10-24 DIAGNOSIS — Z01818 Encounter for other preprocedural examination: Secondary | ICD-10-CM | POA: Diagnosis not present

## 2015-10-24 LAB — CBC WITH DIFFERENTIAL/PLATELET
BASOS ABS: 0 10*3/uL (ref 0.0–0.1)
BASOS PCT: 0 % (ref 0–1)
EOS ABS: 0.1 10*3/uL (ref 0.0–0.7)
Eosinophils Relative: 2 % (ref 0–5)
HCT: 40 % (ref 39.0–52.0)
Hemoglobin: 13.3 g/dL (ref 13.0–17.0)
Lymphocytes Relative: 14 % (ref 12–46)
Lymphs Abs: 0.7 10*3/uL (ref 0.7–4.0)
MCH: 29.4 pg (ref 26.0–34.0)
MCHC: 33.3 g/dL (ref 30.0–36.0)
MCV: 88.5 fL (ref 78.0–100.0)
MPV: 12.9 fL — AB (ref 8.6–12.4)
Monocytes Absolute: 0.5 10*3/uL (ref 0.1–1.0)
Monocytes Relative: 10 % (ref 3–12)
NEUTROS PCT: 74 % (ref 43–77)
Neutro Abs: 3.6 10*3/uL (ref 1.7–7.7)
PLATELETS: 127 10*3/uL — AB (ref 150–400)
RBC: 4.52 MIL/uL (ref 4.22–5.81)
RDW: 13.7 % (ref 11.5–15.5)
WBC: 4.8 10*3/uL (ref 4.0–10.5)

## 2015-10-25 LAB — BASIC METABOLIC PANEL
BUN: 9 mg/dL (ref 7–25)
CHLORIDE: 104 mmol/L (ref 98–110)
CO2: 26 mmol/L (ref 20–31)
Calcium: 9.2 mg/dL (ref 8.6–10.3)
Creat: 0.86 mg/dL (ref 0.70–1.25)
GLUCOSE: 124 mg/dL — AB (ref 65–99)
POTASSIUM: 4.1 mmol/L (ref 3.5–5.3)
Sodium: 140 mmol/L (ref 135–146)

## 2015-10-25 LAB — TSH: TSH: 2.167 u[IU]/mL (ref 0.350–4.500)

## 2015-10-25 LAB — PROTIME-INR
INR: 1.01 (ref <1.50)
PROTHROMBIN TIME: 13.4 s (ref 11.6–15.2)

## 2015-10-25 LAB — APTT: APTT: 31 s (ref 24–37)

## 2015-10-30 ENCOUNTER — Other Ambulatory Visit: Payer: Self-pay | Admitting: Family Medicine

## 2015-10-30 ENCOUNTER — Encounter (HOSPITAL_COMMUNITY)
Admission: RE | Disposition: A | Discharge status: Home / Self Care | Payer: Self-pay | Source: Ambulatory Visit | Attending: Cardiovascular Disease

## 2015-10-30 ENCOUNTER — Encounter (HOSPITAL_COMMUNITY): Payer: Self-pay | Admitting: Cardiovascular Disease

## 2015-10-30 ENCOUNTER — Ambulatory Visit (HOSPITAL_COMMUNITY)
Admission: RE | Admit: 2015-10-30 | Discharge: 2015-10-30 | Disposition: A | Discharge status: Home / Self Care | Payer: Medicare Other | Source: Ambulatory Visit | Attending: Cardiovascular Disease | Admitting: Cardiovascular Disease

## 2015-10-30 DIAGNOSIS — I259 Chronic ischemic heart disease, unspecified: Secondary | ICD-10-CM | POA: Insufficient documentation

## 2015-10-30 DIAGNOSIS — Z7982 Long term (current) use of aspirin: Secondary | ICD-10-CM | POA: Diagnosis not present

## 2015-10-30 DIAGNOSIS — Z87891 Personal history of nicotine dependence: Secondary | ICD-10-CM | POA: Diagnosis not present

## 2015-10-30 DIAGNOSIS — Z955 Presence of coronary angioplasty implant and graft: Secondary | ICD-10-CM | POA: Diagnosis not present

## 2015-10-30 DIAGNOSIS — E785 Hyperlipidemia, unspecified: Secondary | ICD-10-CM | POA: Diagnosis not present

## 2015-10-30 DIAGNOSIS — Z880 Allergy status to penicillin: Secondary | ICD-10-CM | POA: Insufficient documentation

## 2015-10-30 DIAGNOSIS — Z7902 Long term (current) use of antithrombotics/antiplatelets: Secondary | ICD-10-CM | POA: Diagnosis not present

## 2015-10-30 DIAGNOSIS — I6523 Occlusion and stenosis of bilateral carotid arteries: Secondary | ICD-10-CM | POA: Diagnosis not present

## 2015-10-30 DIAGNOSIS — I6521 Occlusion and stenosis of right carotid artery: Secondary | ICD-10-CM | POA: Diagnosis not present

## 2015-10-30 DIAGNOSIS — R0989 Other specified symptoms and signs involving the circulatory and respiratory systems: Secondary | ICD-10-CM | POA: Diagnosis present

## 2015-10-30 DIAGNOSIS — I10 Essential (primary) hypertension: Secondary | ICD-10-CM | POA: Diagnosis not present

## 2015-10-30 DIAGNOSIS — Z951 Presence of aortocoronary bypass graft: Secondary | ICD-10-CM | POA: Diagnosis not present

## 2015-10-30 HISTORY — PX: PERIPHERAL VASCULAR CATHETERIZATION: SHX172C

## 2015-10-30 SURGERY — CAROTID PTA/STENT INTERVENTION
Anesthesia: LOCAL | Laterality: Right

## 2015-10-30 MED ORDER — ACETAMINOPHEN 325 MG PO TABS: 650.0000 mg | ORAL_TABLET | ORAL | Status: DC | PRN

## 2015-10-30 MED ORDER — ONDANSETRON HCL 4 MG/2ML IJ SOLN: 4.0000 mg | Freq: Four times a day (QID) | INTRAMUSCULAR | Status: DC | PRN

## 2015-10-30 MED ORDER — ASPIRIN 81 MG PO CHEW: 81.0000 mg | CHEWABLE_TABLET | ORAL | Status: DC

## 2015-10-30 MED ORDER — SODIUM CHLORIDE 0.9 % IV SOLN: 250.0000 mL | INTRAVENOUS | Status: DC | PRN

## 2015-10-30 MED ORDER — SODIUM CHLORIDE 0.9 % IJ SOLN: 3.0000 mL | INTRAMUSCULAR | Status: DC | PRN

## 2015-10-30 MED ORDER — MORPHINE SULFATE (PF) 2 MG/ML IV SOLN: 2.0000 mg | INTRAVENOUS | Status: DC | PRN

## 2015-10-30 MED ORDER — LIDOCAINE HCL (PF) 1 % IJ SOLN
INTRAMUSCULAR | Status: AC
  Filled 2015-10-30: qty 30

## 2015-10-30 MED ORDER — SODIUM CHLORIDE 0.9 % IV SOLN: INTRAVENOUS | Status: AC

## 2015-10-30 MED ORDER — SODIUM CHLORIDE 0.9 % WEIGHT BASED INFUSION
3.0000 mL/kg/h | INTRAVENOUS | Status: DC
  Administered 2015-10-30: 3 mL/kg/h via INTRAVENOUS

## 2015-10-30 MED ORDER — SODIUM CHLORIDE 0.9 % WEIGHT BASED INFUSION
1.0000 mL/kg/h | INTRAVENOUS | Status: DC
  Administered 2015-10-30: 1 mL/kg/h via INTRAVENOUS

## 2015-10-30 MED ORDER — HEPARIN (PORCINE) IN NACL 2-0.9 UNIT/ML-% IJ SOLN
INTRAMUSCULAR | Status: AC
  Filled 2015-10-30: qty 1000

## 2015-10-30 MED ORDER — SODIUM CHLORIDE 0.9 % IJ SOLN: 3.0000 mL | Freq: Two times a day (BID) | INTRAMUSCULAR | Status: DC

## 2015-10-30 MED ORDER — HEPARIN (PORCINE) IN NACL 2-0.9 UNIT/ML-% IJ SOLN
INTRAMUSCULAR | Status: DC | PRN
  Administered 2015-10-30: 09:00:00

## 2015-10-30 MED ORDER — IODIXANOL 320 MG/ML IV SOLN
INTRAVENOUS | Status: DC | PRN
  Administered 2015-10-30: 100 mL via INTRA_ARTERIAL

## 2015-10-30 SURGICAL SUPPLY — 8 items
CATH ANGIO 5F JB1 100CM (CATHETERS) ×1 IMPLANT
CATH ANGIO 5F PIGTAIL 100CM (CATHETERS) ×1 IMPLANT
KIT PV (KITS) ×2 IMPLANT
SHEATH PINNACLE 5F 10CM (SHEATH) ×1 IMPLANT
SYR MEDRAD MARK V 150ML (SYRINGE) ×2 IMPLANT
TRANSDUCER W/STOPCOCK (MISCELLANEOUS) ×2 IMPLANT
TRAY PV CATH (CUSTOM PROCEDURE TRAY) ×2 IMPLANT
WIRE HITORQ VERSACORE ST 145CM (WIRE) ×1 IMPLANT

## 2015-10-30 NOTE — Progress Notes (Signed)
Site area: rfa Site Prior to Removal:  Level 0 Pressure Applied For:57mn Manual: yes   Patient Status During Pull:  stable Post Pull Site:  Level 0 Post Pull Instructions Given: yes  Post Pull Pulses Present: palpable Dressing Applied:  clear Bedrest begins @ 0920 till 1320 Comments:

## 2015-10-30 NOTE — Discharge Instructions (Signed)
Angiogram, Care After °Refer to this sheet in the next few weeks. These instructions provide you with information about caring for yourself after your procedure. Your health care provider may also give you more specific instructions. Your treatment has been planned according to current medical practices, but problems sometimes occur. Call your health care provider if you have any problems or questions after your procedure. °WHAT TO EXPECT AFTER THE PROCEDURE °After your procedure, it is typical to have the following: °· Bruising at the catheter insertion site that usually fades within 1-2 weeks. °· Blood collecting in the tissue (hematoma) that may be painful to the touch. It should usually decrease in size and tenderness within 1-2 weeks. °HOME CARE INSTRUCTIONS °· Take medicines only as directed by your health care provider. °· You may shower 24-48 hours after the procedure or as directed by your health care provider. Remove the bandage (dressing) and gently wash the site with plain soap and water. Pat the area dry with a clean towel. Do not rub the site, because this may cause bleeding. °· Do not take baths, swim, or use a hot tub until your health care provider approves. °· Check your insertion site every day for redness, swelling, or drainage. °· Do not apply powder or lotion to the site. °· Do not lift over 10 lb (4.5 kg) for 5 days after your procedure or as directed by your health care provider. °· Ask your health care provider when it is okay to: °¨ Return to work or school. °¨ Resume usual physical activities or sports. °¨ Resume sexual activity. °· Do not drive home if you are discharged the same day as the procedure. Have someone else drive you. °· You may drive 24 hours after the procedure unless otherwise instructed by your health care provider. °· Do not operate machinery or power tools for 24 hours after the procedure or as directed by your health care provider. °· If your procedure was done as an  outpatient procedure, which means that you went home the same day as your procedure, a responsible adult should be with you for the first 24 hours after you arrive home. °· Keep all follow-up visits as directed by your health care provider. This is important. °SEEK MEDICAL CARE IF: °· You have a fever. °· You have chills. °· You have increased bleeding from the catheter insertion site. Hold pressure on the site and call 911. °SEEK IMMEDIATE MEDICAL CARE IF: °· You have unusual pain at the catheter insertion site. °· You have redness, warmth, or swelling at the catheter insertion site. °· You have drainage (other than a small amount of blood on the dressing) from the catheter insertion site. °· The catheter insertion site is bleeding, and the bleeding does not stop after 30 minutes of holding steady pressure on the site. °· The area near or just beyond the catheter insertion site becomes pale, cool, tingly, or numb. °  °This information is not intended to replace advice given to you by your health care provider. Make sure you discuss any questions you have with your health care provider. °  °Document Released: 04/10/2005 Document Revised: 10/13/2014 Document Reviewed: 02/23/2013 °Elsevier Interactive Patient Education ©2016 [employer] ° °

## 2015-10-30 NOTE — H&P (Signed)
10/30/15 [name]  March 22, 1946  [id number]  Primary Physician Matthew Gainer, MD Primary Cardiologist: Matthew Harp MD [name]   HPI: [name] is a delightful 70 year old married Caucasian male who is a copy by his wife [name] today. He is referred through the courtesy of [name] for peripheral vascular evaluation because of asymptomatic high-grade right internal carotid artery stenosis. He has a long history of ischemic heart disease status post remote coronary artery bypass grafting with multiple PCI and stent procedures since that time. He does have hypertension, hyperlipidemia. [name] has been following his carotid Dopplers which have demonstrated an occluded left carotid with high-grade right internal carotid endarterectomy restenosis. He is asymptomatic. Given his comorbidities, ischemic heart disease and the fact that he is on Trileptal up a therapy in addition to the fact that he has endarterectomy restenosis [name] felt he was a more appropriate candidate for carotid artery stenting.   Current Outpatient Prescriptions  Medication Sig Dispense Refill  . ALPRAZolam (XANAX) 1 MG tablet TAKE 1 TABLET 3 TIMES A DAY AS NEEDED FOR ANXIETY 90 tablet 1  . amLODipine (NORVASC) 10 MG tablet TAKE 1 TABLET EVERY DAY 30 tablet 11  . aspirin EC 81 MG tablet Take 1 tablet (81 mg total) by mouth daily. 30 tablet 4  . EFFIENT 10 MG TABS tablet TAKE 1 TABLET EVERY DAY 30 tablet 2  . fenofibrate 160 MG tablet TAKE 1 TABLET (160 MG TOTAL) BY MOUTH DAILY. 30 tablet 4  . gabapentin (NEURONTIN) 300 MG capsule TAKE 1 CAPSULE (300 MG TOTAL) BY MOUTH EVERY 8 (EIGHT) HOURS. 270 capsule 3  . hydrochlorothiazide (MICROZIDE) 12.5 MG capsule Take 2 capsules (25 mg total) by mouth daily. 60 capsule 11  . HYDROcodone-acetaminophen (NORCO) 10-325 MG tablet Take 1 tablet by mouth every 6 (six) hours as needed for moderate pain. 120  tablet 0  . isosorbide mononitrate (IMDUR) 60 MG 24 hr tablet Take 1 tablet (60 mg total) by mouth daily. 30 tablet 11  . KLOR-CON M20 20 MEQ tablet TAKE 1 TABLET (20 MEQ TOTAL) BY MOUTH DAILY. 30 tablet 11  . metoprolol (LOPRESSOR) 50 MG tablet TAKE 1 TABLET (50 MG TOTAL) BY MOUTH 2 (TWO) TIMES DAILY. 180 tablet 2  . Multiple Vitamin (MULTIVITAMIN) capsule Take 1 capsule by mouth daily.     . nitroGLYCERIN (NITROSTAT) 0.4 MG SL tablet Place 0.4 mg under the tongue every 5 (five) minutes as needed. After 3 doses and no relief call 911    . prasugrel (EFFIENT) 10 MG TABS tablet Take 10 mg by mouth daily.    . rosuvastatin (CRESTOR) 40 MG tablet Take 20 mg by mouth daily.     Current Facility-Administered Medications  Medication Dose Route Frequency Provider Last Rate Last Dose  . cyanocobalamin ((VITAMIN B-12)) injection 1,000 mcg 1,000 mcg Intramuscular Q30 days Matthew Honour, MD  1,000 mcg at 09/06/15 4854    Allergies  Allergen Reactions  . Demerol Nausea And Vomiting  . Clopidogrel Bisulfate     Severe rash  . Meperidine Hcl     Unknown, per pt   . Penicillins     Rash   . Propoxyphene N-Acetaminophen     nauesa    Social History   Social History  . Marital Status: Married    Spouse Name: N/A  . Number of Children: N/A  . Years of Education: N/A   Occupational History  . Not on file.   Social History Main  Topics  . Smoking status: Former Research scientist (life sciences)  . Smokeless tobacco: Not on file  . Alcohol Use: No  . Drug Use: No  . Sexual Activity: Not on file   Other Topics Concern  . Not on file   Social History Narrative     Review of Systems: General: negative for chills, fever, night sweats or weight changes.  Cardiovascular: negative for chest pain, dyspnea on exertion, edema, orthopnea, palpitations, paroxysmal nocturnal  dyspnea or shortness of breath Dermatological: negative for rash Respiratory: negative for cough or wheezing Urologic: negative for hematuria Abdominal: negative for nausea, vomiting, diarrhea, bright red blood per rectum, melena, or hematemesis Neurologic: negative for visual changes, syncope, or dizziness All other systems reviewed and are otherwise negative except as noted above.    Blood pressure 142/64, pulse 65, height '5\' 5"'$  (1.651 m), weight 152 lb (68.947 kg).  General appearance: alert and no distress Neck: no adenopathy, no JVD, supple, symmetrical, trachea midline, thyroid not enlarged, symmetric, no tenderness/mass/nodules and bilateral carotid bruits Lungs: clear to auscultation bilaterally Heart: regular rate and rhythm, S1, S2 normal, no murmur, click, rub or gallop Extremities: extremities normal, atraumatic, no cyanosis or edema  EKG not performed today  ASSESSMENT AND PLAN:   CAROTID ARTERY STENOSIS, WITHOUT INFARCTION [name] was referred to me by [name] for evaluation and treatment of carotid artery disease. He is a 70 year old gentleman with a remote history of right carotid endarterectomy back in 1998. He has a known occluded left carotid artery. He does have a history of ischemic heart disease with mild LV dysfunction and is on dual antiplatelet therapy. Recent Dopplers show shown an occluded left carotid with high grade right carotid endarterectomy restenosis. The patient is deemed a "high risk" surgical candidate by Dr. Trula [name] which I agree with and feel he would be a acceptable candidate for carotid artery stenting.   [name], M.D., [place], [facility], [place] [address] [place], [place]  [address]  [phone] 10/30/2015 7:13 AM

## 2015-10-30 NOTE — Interval H&P Note (Signed)
History and Physical Interval Note:  10/30/2015 7:57 AM  [name]  has presented today for surgery, with the diagnosis of carotid disease  The various methods of treatment have been discussed with the patient and family. After consideration of risks, benefits and other options for treatment, the patient has consented to  Procedure(s): Carotid PTA/Stent Intervention (Right) as a surgical intervention .  The patient's history has been reviewed, patient examined, no change in status, stable for surgery.  I have reviewed the patient's chart and labs.  Questions were answered to the patient's satisfaction.     Quay Burow

## 2015-11-09 ENCOUNTER — Ambulatory Visit (INDEPENDENT_AMBULATORY_CARE_PROVIDER_SITE_OTHER): Payer: Medicare Other | Admitting: *Deleted

## 2015-11-09 DIAGNOSIS — E538 Deficiency of other specified B group vitamins: Secondary | ICD-10-CM

## 2015-11-09 NOTE — Patient Instructions (Signed)

## 2015-11-09 NOTE — Progress Notes (Signed)
Pt given B12 injection IM left deltoid and tolerated well. °

## 2015-11-14 ENCOUNTER — Ambulatory Visit (INDEPENDENT_AMBULATORY_CARE_PROVIDER_SITE_OTHER): Payer: Medicare Other | Admitting: Family Medicine

## 2015-11-14 ENCOUNTER — Ambulatory Visit (INDEPENDENT_AMBULATORY_CARE_PROVIDER_SITE_OTHER): Payer: Medicare Other

## 2015-11-14 ENCOUNTER — Encounter: Payer: Self-pay | Admitting: Family Medicine

## 2015-11-14 VITALS — BP 118/57 | HR 71 | Temp 97.0°F | Ht 65.0 in | Wt 157.2 lb

## 2015-11-14 DIAGNOSIS — R042 Hemoptysis: Secondary | ICD-10-CM

## 2015-11-14 DIAGNOSIS — I6521 Occlusion and stenosis of right carotid artery: Secondary | ICD-10-CM | POA: Diagnosis not present

## 2015-11-14 DIAGNOSIS — R0600 Dyspnea, unspecified: Secondary | ICD-10-CM

## 2015-11-14 LAB — POCT INR: INR: 1.2

## 2015-11-14 MED ORDER — HYDROCODONE-HOMATROPINE 5-1.5 MG/5ML PO SYRP: 5.0000 mL | ORAL_SOLUTION | Freq: Four times a day (QID) | ORAL | Status: DC | PRN

## 2015-11-14 MED ORDER — LEVOFLOXACIN 500 MG PO TABS: 500.0000 mg | ORAL_TABLET | Freq: Every day | ORAL | Status: DC

## 2015-11-14 NOTE — Progress Notes (Signed)
Subjective:  Patient ID: [name], male    DOB: [birth year]  Age: 70 y.o. MRN: [id number]  CC: URI   HPI [name] presents for Patient presents with upper respiratory congestion. No Rhinorrhea . There is no sore throat. Patient reports coughing frequently that has been slowly increasing for 2 weeks - since release form hospital.  Blood tinged, purulent sputum noted starting yesterday. Also dyspneic.Marland Kitchen There is no fever no chills no sweats. . Gradually worsening in spite of robitussin used at home.Feeling weaker with time  History [name] has a past medical history of Hypertension; Hyperlipidemia; CAD (coronary artery disease); Dementia; PVD (peripheral vascular disease) ([place]); ASCVD (arteriosclerotic cardiovascular disease); Anxiety; OA (osteoarthritis); Neuropathic pain; History of radiation therapy (11/15/08-02/23/09); Lung cancer [facility]); CHF (congestive heart failure) ([place]); and Radiation (May 2010).   He has past surgical history that includes Coronary artery bypass graft (7/95); Carotid endarterectomy (12/98); Cardiac catheterization (12/20/04); Lung cancer surgery; left heart catheterization with coronary angiogram (N/A, 12/16/2013); and Cardiac catheterization (Right, 10/30/2015).   His family history includes Coronary artery disease in his other; Heart attack in his mother; Heart disease in his brother, father, and sister.He reports that he has quit smoking. He does not have any smokeless tobacco history on file. He reports that he does not drink alcohol or use illicit drugs.  Current Outpatient Prescriptions on File Prior to Visit  Medication Sig Dispense Refill  . acetaminophen (TYLENOL) 500 MG tablet Take 500-1,000 mg by mouth 2 (two) times daily as needed for moderate pain.    Marland Kitchen ALPRAZolam (XANAX) 1 MG tablet TAKE 1 TABLET 3 TIMES A DAY AS NEEDED FOR ANXIETY 90 tablet 1  . amLODipine (NORVASC) 10 MG tablet TAKE 1 TABLET EVERY DAY 30 tablet 11  . aspirin EC 81 MG tablet  Take 1 tablet (81 mg total) by mouth daily. 30 tablet 4  . cholecalciferol (VITAMIN D) 1000 units tablet Take 1,000 Units by mouth daily.    Marland Kitchen EFFIENT 10 MG TABS tablet TAKE 1 TABLET EVERY DAY 30 tablet 2  . fenofibrate 160 MG tablet TAKE 1 TABLET (160 MG TOTAL) BY MOUTH DAILY. 30 tablet 3  . gabapentin (NEURONTIN) 300 MG capsule TAKE 1 CAPSULE (300 MG TOTAL) BY MOUTH EVERY 8 (EIGHT) HOURS. (Patient taking differently: Take 300 mg by mouth 3 (three) times daily. TAKE 1 CAPSULE (300 MG TOTAL) BY MOUTH EVERY 8 (EIGHT) HOURS.) 270 capsule 3  . hydrochlorothiazide (MICROZIDE) 12.5 MG capsule Take 2 capsules (25 mg total) by mouth daily. 60 capsule 11  . HYDROcodone-acetaminophen (NORCO) 10-325 MG tablet Take 1 tablet by mouth every 6 (six) hours as needed for moderate pain. 120 tablet 0  . isosorbide mononitrate (IMDUR) 60 MG 24 hr tablet Take 1 tablet (60 mg total) by mouth daily. 30 tablet 11  . KLOR-CON M20 20 MEQ tablet TAKE 1 TABLET (20 MEQ TOTAL) BY MOUTH DAILY. 30 tablet 11  . metoprolol (LOPRESSOR) 50 MG tablet TAKE 1 TABLET (50 MG TOTAL) BY MOUTH 2 (TWO) TIMES DAILY. 180 tablet 2  . Multiple Vitamin (MULTIVITAMIN) capsule Take 1 capsule by mouth daily.      . nitroGLYCERIN (NITROSTAT) 0.4 MG SL tablet Place 0.4 mg under the tongue every 5 (five) minutes as needed. After 3 doses and no relief call 911    . prasugrel (EFFIENT) 10 MG TABS tablet Take 10 mg by mouth daily. Reported on 10/25/2015    . rosuvastatin (CRESTOR) 40 MG tablet TAKE 1 TABLET BY MOUTH  EVERY DAY 90 tablet 1   Current Facility-Administered Medications on File Prior to Visit  Medication Dose Route Frequency Provider Last Rate Last Dose  . cyanocobalamin ((VITAMIN B-12)) injection 1,000 mcg  1,000 mcg Intramuscular Q30 days Wardell Honour, MD   1,000 mcg at 11/09/15 0907    ROS Review of Systems  Constitutional: Negative for fever, chills, activity change and appetite change.  HENT: Positive for congestion. Negative for  ear discharge, ear pain, hearing loss, nosebleeds, postnasal drip, rhinorrhea, sinus pressure, sneezing and trouble swallowing.   Respiratory: Positive for cough, chest tightness and shortness of breath.   Cardiovascular: Negative for chest pain and palpitations.  Skin: Negative for rash.    Objective:  BP 118/57 mmHg  Pulse 71  Temp(Src) 97 F (36.1 C) (Oral)  Ht 5' 5"  (1.651 m)  Wt 157 lb 3.2 oz (71.305 kg)  BMI 26.16 kg/m2  SpO2 98%  Physical Exam  Constitutional: He appears well-developed and well-nourished.  HENT:  Head: Normocephalic and atraumatic.  Right Ear: Tympanic membrane and external ear normal. No decreased hearing is noted.  Left Ear: Tympanic membrane and external ear normal. No decreased hearing is noted.  Nose: Mucosal edema present. Right sinus exhibits no frontal sinus tenderness. Left sinus exhibits no frontal sinus tenderness.  Mouth/Throat: No oropharyngeal exudate or posterior oropharyngeal erythema.  Neck: No Brudzinski's sign noted.  Pulmonary/Chest: Effort normal. No respiratory distress. He has wheezes in the right middle field, the right lower field and the left lower field. He has rales in the right middle field, the right lower field and the left lower field.  Lymphadenopathy:       Head (right side): No preauricular adenopathy present.       Head (left side): No preauricular adenopathy present.       Right cervical: No superficial cervical adenopathy present.      Left cervical: No superficial cervical adenopathy present.    Assessment & Plan:   [name] was seen today for uri.  Diagnoses and all orders for this visit:  Dyspnea -     DG Chest 2 View; Future -     CBC with Differential/Platelet -     CMP14+EGFR -     POCT INR  Hemoptysis -     DG Chest 2 View; Future -     CBC with Differential/Platelet -     CMP14+EGFR -     POCT INR  Other orders -     levofloxacin (LEVAQUIN) 500 MG tablet; Take 1 tablet (500 mg total) by mouth  daily. -     HYDROcodone-homatropine (HYCODAN) 5-1.5 MG/5ML syrup; Take 5 mLs by mouth every 6 (six) hours as needed for cough.   I am having [name] start on levofloxacin and HYDROcodone-homatropine. I am also having him maintain his multivitamin, nitroGLYCERIN, aspirin EC, gabapentin, metoprolol, prasugrel, hydrochlorothiazide, isosorbide mononitrate, amLODipine, KLOR-CON M20, EFFIENT, rosuvastatin, HYDROcodone-acetaminophen, ALPRAZolam, cholecalciferol, acetaminophen, and fenofibrate. We will continue to administer cyanocobalamin.  Meds ordered this encounter  Medications  . levofloxacin (LEVAQUIN) 500 MG tablet    Sig: Take 1 tablet (500 mg total) by mouth daily.    Dispense:  7 tablet    Refill:  0  . HYDROcodone-homatropine (HYCODAN) 5-1.5 MG/5ML syrup    Sig: Take 5 mLs by mouth every 6 (six) hours as needed for cough.    Dispense:  120 mL    Refill:  0     Follow-up: Return in about 7 days (around 11/21/2015), or  if symptoms worsen or fail to improve.  Claretta Fraise, M.D.

## 2015-11-15 LAB — CBC WITH DIFFERENTIAL/PLATELET
BASOS ABS: 0 10*3/uL (ref 0.0–0.2)
BASOS: 0 %
EOS (ABSOLUTE): 0.1 10*3/uL (ref 0.0–0.4)
Eos: 1 %
HEMOGLOBIN: 12.1 g/dL — AB (ref 12.6–17.7)
Hematocrit: 36.9 % — ABNORMAL LOW (ref 37.5–51.0)
IMMATURE GRANS (ABS): 0 10*3/uL (ref 0.0–0.1)
Immature Granulocytes: 0 %
LYMPHS ABS: 0.8 10*3/uL (ref 0.7–3.1)
LYMPHS: 7 %
MCH: 28.4 pg (ref 26.6–33.0)
MCHC: 32.8 g/dL (ref 31.5–35.7)
MCV: 87 fL (ref 79–97)
Monocytes Absolute: 0.7 10*3/uL (ref 0.1–0.9)
Monocytes: 6 %
NEUTROS ABS: 10 10*3/uL — AB (ref 1.4–7.0)
Neutrophils: 86 %
PLATELETS: 161 10*3/uL (ref 150–379)
RBC: 4.26 x10E6/uL (ref 4.14–5.80)
RDW: 14.3 % (ref 12.3–15.4)
WBC: 11.7 10*3/uL — ABNORMAL HIGH (ref 3.4–10.8)

## 2015-11-15 LAB — CMP14+EGFR
A/G RATIO: 1.9 (ref 1.1–2.5)
ALBUMIN: 4.1 g/dL (ref 3.6–4.8)
ALK PHOS: 48 IU/L (ref 39–117)
ALT: 11 IU/L (ref 0–44)
AST: 16 IU/L (ref 0–40)
BILIRUBIN TOTAL: 0.5 mg/dL (ref 0.0–1.2)
BUN / CREAT RATIO: 10 (ref 10–22)
BUN: 9 mg/dL (ref 8–27)
CHLORIDE: 103 mmol/L (ref 96–106)
CO2: 22 mmol/L (ref 18–29)
Calcium: 8.7 mg/dL (ref 8.6–10.2)
Creatinine, Ser: 0.86 mg/dL (ref 0.76–1.27)
GFR calc non Af Amer: 88 mL/min/{1.73_m2} (ref >59)
GFR, EST AFRICAN AMERICAN: 102 mL/min/{1.73_m2} (ref >59)
Globulin, Total: 2.2 g/dL (ref 1.5–4.5)
Glucose: 94 mg/dL (ref 65–99)
POTASSIUM: 4.1 mmol/L (ref 3.5–5.2)
Sodium: 141 mmol/L (ref 134–144)
TOTAL PROTEIN: 6.3 g/dL (ref 6.0–8.5)

## 2015-11-19 ENCOUNTER — Other Ambulatory Visit: Payer: Self-pay | Admitting: Family Medicine

## 2015-11-19 MED ORDER — HYDROCODONE-ACETAMINOPHEN 10-325 MG PO TABS: 1.0000 | ORAL_TABLET | Freq: Four times a day (QID) | ORAL | Status: DC | PRN

## 2015-11-19 NOTE — Telephone Encounter (Signed)
Last filled 10/22/15, last seen 08/21/15

## 2015-11-21 ENCOUNTER — Ambulatory Visit (INDEPENDENT_AMBULATORY_CARE_PROVIDER_SITE_OTHER): Payer: Medicare Other | Admitting: Family Medicine

## 2015-11-21 ENCOUNTER — Encounter: Payer: Self-pay | Admitting: Family Medicine

## 2015-11-21 VITALS — BP 166/65 | HR 63 | Temp 96.9°F | Ht 65.0 in | Wt 155.0 lb

## 2015-11-21 DIAGNOSIS — R0989 Other specified symptoms and signs involving the circulatory and respiratory systems: Secondary | ICD-10-CM

## 2015-11-21 DIAGNOSIS — R05 Cough: Secondary | ICD-10-CM | POA: Diagnosis not present

## 2015-11-21 DIAGNOSIS — R0789 Other chest pain: Secondary | ICD-10-CM

## 2015-11-21 DIAGNOSIS — J209 Acute bronchitis, unspecified: Secondary | ICD-10-CM

## 2015-11-21 DIAGNOSIS — J40 Bronchitis, not specified as acute or chronic: Secondary | ICD-10-CM | POA: Diagnosis not present

## 2015-11-21 DIAGNOSIS — I6521 Occlusion and stenosis of right carotid artery: Secondary | ICD-10-CM | POA: Diagnosis not present

## 2015-11-21 DIAGNOSIS — R059 Cough, unspecified: Secondary | ICD-10-CM

## 2015-11-21 MED ORDER — PREDNISONE 10 MG (21) PO TBPK: ORAL_TABLET | ORAL | Status: DC

## 2015-11-21 MED ORDER — METHYLPREDNISOLONE ACETATE 80 MG/ML IJ SUSP
60.0000 mg | Freq: Once | INTRAMUSCULAR | Status: AC
  Administered 2015-11-21: 60 mg via INTRAMUSCULAR

## 2015-11-21 NOTE — Progress Notes (Signed)
Subjective:    Patient ID: [name], male    DOB: 1946-08-24, 70 y.o.   MRN: [id number]  HPI Patient here today for continued cough, congestion and SOB. He has completed a round of antibiotics and cough syrup. The patient apparently had carotid angiography which showed a 60% carotid LOC is in late January and subsequently developed cough and congestion and was treated with Levaquin and strong cough medication. He did get some better from that but is still coughing up yellow plain. He is not running any fever. He says he has a dry cough. Still has some shortness of breath. He has electric heat at home and he keeps the thermostat at 72. He has no other symptoms as far as nausea vomiting diarrhea or trouble passing his water. He had a recent chest x-ray this month that was within normal limits and the patient said he had a CT scan prior to the procedure that was done and that that  was stable. The CT scan was asked to done in November by his oncologist and all was stable according this report.      Patient Active Problem List   Diagnosis Date Noted  . Right carotid bruit 11/23/2014  . Claudication ([place]) 11/23/2014  . Thrombocytopenia ([place]) 07/17/2014  . Radiation fibrosis of lung ([place]) 07/17/2014  . Peripheral vascular insufficiency ([place]) 07/17/2014  . Peripheral neuropathy due to chemotherapy ([place]) 07/17/2014  . Allergic rhinitis due to pollen 07/17/2014  . Adenocarcinoma of the right upper lobe 03/09/2014  . BPH (benign prostatic hyperplasia) 03/09/2014  . Need for prophylactic vaccination and inoculation against influenza 09/24/2013  . Hyperlipemia 09/24/2013  . Ecchymosis 09/24/2013  . Mild eczema 09/24/2013  . Chronic pain syndrome 06/21/2013  . Anxiety state, unspecified 03/17/2013  . FEMORAL BRUIT 12/04/2010  . HYPERTENSION, BENIGN 12/13/2008  . CAD, ARTERY BYPASS GRAFT 12/13/2008  . 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 MYOCARDIAL INFARCTION  10/16/2008  . Swelling, mass, or lump in chest 10/16/2008   Outpatient Encounter Prescriptions as of 11/21/2015  Medication Sig  . acetaminophen (TYLENOL) 500 MG tablet Take 500-1,000 mg by mouth 2 (two) times daily as needed for moderate pain.  Marland Kitchen ALPRAZolam (XANAX) 1 MG tablet TAKE 1 TABLET 3 TIMES A DAY AS NEEDED FOR ANXIETY  . amLODipine (NORVASC) 10 MG tablet TAKE 1 TABLET EVERY DAY  . aspirin EC 81 MG tablet Take 1 tablet (81 mg total) by mouth daily.  . cholecalciferol (VITAMIN D) 1000 units tablet Take 1,000 Units by mouth daily.  Marland Kitchen EFFIENT 10 MG TABS tablet TAKE 1 TABLET EVERY DAY  . fenofibrate 160 MG tablet TAKE 1 TABLET (160 MG TOTAL) BY MOUTH DAILY.  Marland Kitchen gabapentin (NEURONTIN) 300 MG capsule TAKE 1 CAPSULE (300 MG TOTAL) BY MOUTH EVERY 8 (EIGHT) HOURS. (Patient taking differently: Take 300 mg by mouth 3 (three) times daily. TAKE 1 CAPSULE (300 MG TOTAL) BY MOUTH EVERY 8 (EIGHT) HOURS.)  . hydrochlorothiazide (MICROZIDE) 12.5 MG capsule Take 2 capsules (25 mg total) by mouth daily.  Marland Kitchen HYDROcodone-acetaminophen (NORCO) 10-325 MG tablet Take 1 tablet by mouth every 6 (six) hours as needed for moderate pain.  . isosorbide mononitrate (IMDUR) 60 MG 24 hr tablet Take 1 tablet (60 mg total) by mouth daily.  Marland Kitchen KLOR-CON M20 20 MEQ tablet TAKE 1 TABLET (20 MEQ TOTAL) BY MOUTH DAILY.  . metoprolol (LOPRESSOR) 50 MG tablet TAKE 1 TABLET (50 MG TOTAL) BY MOUTH 2 (TWO) TIMES DAILY.  . Multiple  Vitamin (MULTIVITAMIN) capsule Take 1 capsule by mouth daily.    . nitroGLYCERIN (NITROSTAT) 0.4 MG SL tablet Place 0.4 mg under the tongue every 5 (five) minutes as needed. After 3 doses and no relief call 911  . prasugrel (EFFIENT) 10 MG TABS tablet Take 10 mg by mouth daily. Reported on 10/25/2015  . rosuvastatin (CRESTOR) 40 MG tablet TAKE 1 TABLET BY MOUTH EVERY DAY  . [DISCONTINUED] HYDROcodone-homatropine (HYCODAN) 5-1.5 MG/5ML syrup Take 5 mLs by mouth every 6 (six) hours as needed for cough.  .  [DISCONTINUED] levofloxacin (LEVAQUIN) 500 MG tablet Take 1 tablet (500 mg total) by mouth daily.   Facility-Administered Encounter Medications as of 11/21/2015  Medication  . cyanocobalamin ((VITAMIN B-12)) injection 1,000 mcg      Review of Systems  Constitutional: Negative.   HENT: Positive for congestion.   Eyes: Negative.   Respiratory: Positive for cough, chest tightness (from cough) and shortness of breath.   Cardiovascular: Negative.   Gastrointestinal: Negative.   Endocrine: Negative.   Genitourinary: Negative.   Musculoskeletal: Negative.   Skin: Negative.   Allergic/Immunologic: Negative.   Neurological: Negative.   Hematological: Negative.   Psychiatric/Behavioral: Negative.        Objective:   Physical Exam  Constitutional: He is oriented to person, place, and time. He appears well-developed and well-nourished. No distress.  HENT:  Head: Normocephalic and atraumatic.  Right Ear: External ear normal.  Left Ear: External ear normal.  Nose: Nose normal.  Mouth/Throat: Oropharynx is clear and moist. No oropharyngeal exudate.  Eyes: Conjunctivae and EOM are normal. Pupils are equal, round, and reactive to light. Right eye exhibits no discharge. Left eye exhibits no discharge. No scleral icterus.  Neck: Normal range of motion. Neck supple. No thyromegaly present.  No adenopathy  Cardiovascular: Normal rate, regular rhythm and normal heart sounds.   No murmur heard. The heart is regular at 63/m  Pulmonary/Chest: Effort normal and breath sounds normal. No respiratory distress. He has no wheezes. He has no rales. He exhibits no tenderness.  There were a few wheezes initially and the patient hasa tight cough without rales or rhonchi  Abdominal: Soft. Bowel sounds are normal. He exhibits no mass. There is no tenderness. There is no rebound and no guarding.  The groin site for the angiography was well-healed without any sign of infection or swelling or drainage    Musculoskeletal: Normal range of motion. He exhibits no edema.  Lymphadenopathy:    He has no cervical adenopathy.  Neurological: He is alert and oriented to person, place, and time.  Skin: Skin is warm and dry. No rash noted.  Psychiatric: He has a normal mood and affect. His behavior is normal. Judgment and thought content normal.  Nursing note and vitals reviewed.  BP 166/65 mmHg  Pulse 63  Temp(Src) 96.9 F (36.1 C) (Oral)  Ht 5' 5"  (1.651 m)  Wt 155 lb (70.308 kg)  BMI 25.79 kg/m2  SpO2 100%        Assessment & Plan:  1. Cough -The patient will be given a Brio inhaler and will be instructed by the nurse how to use this. He should use 1 puff once daily and rinse the mouth after using - BMP8+EGFR - CBC with Differential/Platelet - methylPREDNISolone acetate (DEPO-MEDROL) injection 60 mg; Inject 0.75 mLs (60 mg total) into the muscle once.  2. Chest congestion -Take Mucinex plain blue and white in color one twice daily with a large glass of water - BMP8+EGFR -  CBC with Differential/Platelet - methylPREDNISolone acetate (DEPO-MEDROL) injection 60 mg; Inject 0.75 mLs (60 mg total) into the muscle once.  3. Chest tightness -Keep the house as cool as possible and drink plenty of fluids - BMP8+EGFR - CBC with Differential/Platelet - methylPREDNISolone acetate (DEPO-MEDROL) injection 60 mg; Inject 0.75 mLs (60 mg total) into the muscle once.  4. Bronchitis with bronchospasm -Use prednisone and steroid inhaler as directed. Return to clinic in 2 weeks for recheck  Patient Instructions  The patient is encouraged to drink plenty of fluids and keep the house as cool as possible He should use nasal saline frequently in each nostril through the day and use nasal saline gel at nighttime in each nostril He should take the prednisone as directed He should use the inhaler 1 puff once daily and rinse mouth after using He should make sure that he is not running any overhead  fans   Arrie Senate MD

## 2015-11-21 NOTE — Patient Instructions (Signed)
The patient is encouraged to drink plenty of fluids and keep the house as cool as possible He should use nasal saline frequently in each nostril through the day and use nasal saline gel at nighttime in each nostril He should take the prednisone as directed He should use the inhaler 1 puff once daily and rinse mouth after using He should make sure that he is not running any overhead fans

## 2015-11-22 LAB — CBC WITH DIFFERENTIAL/PLATELET
BASOS ABS: 0 10*3/uL (ref 0.0–0.2)
BASOS: 0 %
EOS (ABSOLUTE): 0.2 10*3/uL (ref 0.0–0.4)
Eos: 5 %
Hematocrit: 39.6 % (ref 37.5–51.0)
Hemoglobin: 13 g/dL (ref 12.6–17.7)
Immature Grans (Abs): 0 10*3/uL (ref 0.0–0.1)
Immature Granulocytes: 0 %
Lymphocytes Absolute: 0.8 10*3/uL (ref 0.7–3.1)
Lymphs: 18 %
MCH: 28.6 pg (ref 26.6–33.0)
MCHC: 32.8 g/dL (ref 31.5–35.7)
MCV: 87 fL (ref 79–97)
MONOS ABS: 0.6 10*3/uL (ref 0.1–0.9)
Monocytes: 13 %
NEUTROS ABS: 2.9 10*3/uL (ref 1.4–7.0)
Neutrophils: 64 %
PLATELETS: 191 10*3/uL (ref 150–379)
RBC: 4.54 x10E6/uL (ref 4.14–5.80)
RDW: 13.5 % (ref 12.3–15.4)
WBC: 4.6 10*3/uL (ref 3.4–10.8)

## 2015-11-22 LAB — BMP8+EGFR
BUN/Creatinine Ratio: 11 (ref 10–22)
BUN: 9 mg/dL (ref 8–27)
CHLORIDE: 104 mmol/L (ref 96–106)
CO2: 21 mmol/L (ref 18–29)
Calcium: 8.7 mg/dL (ref 8.6–10.2)
Creatinine, Ser: 0.81 mg/dL (ref 0.76–1.27)
GFR, EST AFRICAN AMERICAN: 105 mL/min/{1.73_m2} (ref >59)
GFR, EST NON AFRICAN AMERICAN: 91 mL/min/{1.73_m2} (ref >59)
Glucose: 83 mg/dL (ref 65–99)
POTASSIUM: 3.9 mmol/L (ref 3.5–5.2)
SODIUM: 141 mmol/L (ref 134–144)

## 2015-11-30 ENCOUNTER — Other Ambulatory Visit: Payer: Self-pay | Admitting: Family Medicine

## 2015-12-04 ENCOUNTER — Encounter: Payer: Self-pay | Admitting: Family Medicine

## 2015-12-04 ENCOUNTER — Ambulatory Visit (INDEPENDENT_AMBULATORY_CARE_PROVIDER_SITE_OTHER): Payer: Medicare Other | Admitting: Family Medicine

## 2015-12-04 VITALS — BP 128/60 | HR 59 | Temp 96.9°F | Ht 65.0 in | Wt 155.0 lb

## 2015-12-04 DIAGNOSIS — J40 Bronchitis, not specified as acute or chronic: Secondary | ICD-10-CM

## 2015-12-04 DIAGNOSIS — I6521 Occlusion and stenosis of right carotid artery: Secondary | ICD-10-CM | POA: Diagnosis not present

## 2015-12-04 DIAGNOSIS — J209 Acute bronchitis, unspecified: Secondary | ICD-10-CM

## 2015-12-04 DIAGNOSIS — L309 Dermatitis, unspecified: Secondary | ICD-10-CM

## 2015-12-04 MED ORDER — MUPIROCIN CALCIUM 2 % EX CREA: 1.0000 "application " | TOPICAL_CREAM | Freq: Two times a day (BID) | CUTANEOUS | Status: DC

## 2015-12-04 NOTE — Progress Notes (Signed)
Subjective:    Patient ID: MAJESTIC BRISTER, male    DOB: 1946/05/16, 70 y.o.   MRN: [id number]  HPI Patient here today for 2 week follow up on cough and congestion. He is some better today and has finished all medications. She still has some congestion and his cough is somewhat better. He was recently treated with prednisone and a Brio inhaler. When he presented at the last visit his CBC had a normal white blood cell count. The patient is also concerned with some irritation on his left hand, dorsal aspect which is where he had an IV when he was in the hospital. His breathing is better he is still coughing up some slightly yellow tinged sputum. He has run out of the inhaler.     Patient Active Problem List   Diagnosis Date Noted  . Right carotid bruit 11/23/2014  . Claudication ([place]) 11/23/2014  . Thrombocytopenia ([place]) 07/17/2014  . Radiation fibrosis of lung ([place]) 07/17/2014  . Peripheral vascular insufficiency ([place]) 07/17/2014  . Peripheral neuropathy due to chemotherapy ([place]) 07/17/2014  . Allergic rhinitis due to pollen 07/17/2014  . Adenocarcinoma of the right upper lobe 03/09/2014  . BPH (benign prostatic hyperplasia) 03/09/2014  . Need for prophylactic vaccination and inoculation against influenza 09/24/2013  . Hyperlipemia 09/24/2013  . Ecchymosis 09/24/2013  . Mild eczema 09/24/2013  . Chronic pain syndrome 06/21/2013  . Anxiety state, unspecified 03/17/2013  . FEMORAL BRUIT 12/04/2010  . HYPERTENSION, BENIGN 12/13/2008  . CAD, ARTERY BYPASS GRAFT 12/13/2008  . 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 MYOCARDIAL INFARCTION 10/16/2008  . Swelling, mass, or lump in chest 10/16/2008   Outpatient Encounter Prescriptions as of 12/04/2015  Medication Sig  . acetaminophen (TYLENOL) 500 MG tablet Take 500-1,000 mg by mouth 2 (two) times daily as needed for moderate pain.  Marland Kitchen ALPRAZolam (XANAX) 1 MG tablet TAKE 1 TABLET 3 TIMES A DAY AS NEEDED FOR ANXIETY  .  amLODipine (NORVASC) 10 MG tablet TAKE 1 TABLET EVERY DAY  . aspirin EC 81 MG tablet Take 1 tablet (81 mg total) by mouth daily.  . cholecalciferol (VITAMIN D) 1000 units tablet Take 1,000 Units by mouth daily.  Marland Kitchen EFFIENT 10 MG TABS tablet TAKE 1 TABLET EVERY DAY  . fenofibrate 160 MG tablet TAKE 1 TABLET (160 MG TOTAL) BY MOUTH DAILY.  Marland Kitchen gabapentin (NEURONTIN) 300 MG capsule TAKE 1 CAPSULE (300 MG TOTAL) BY MOUTH EVERY 8 (EIGHT) HOURS. (Patient taking differently: Take 300 mg by mouth 3 (three) times daily. TAKE 1 CAPSULE (300 MG TOTAL) BY MOUTH EVERY 8 (EIGHT) HOURS.)  . hydrochlorothiazide (MICROZIDE) 12.5 MG capsule Take 2 capsules (25 mg total) by mouth daily.  Marland Kitchen HYDROcodone-acetaminophen (NORCO) 10-325 MG tablet Take 1 tablet by mouth every 6 (six) hours as needed for moderate pain.  . isosorbide mononitrate (IMDUR) 60 MG 24 hr tablet Take 1 tablet (60 mg total) by mouth daily.  Marland Kitchen KLOR-CON M20 20 MEQ tablet TAKE 1 TABLET (20 MEQ TOTAL) BY MOUTH DAILY.  . metoprolol (LOPRESSOR) 50 MG tablet TAKE 1 TABLET (50 MG TOTAL) BY MOUTH 2 (TWO) TIMES DAILY.  . Multiple Vitamin (MULTIVITAMIN) capsule Take 1 capsule by mouth daily.    . nitroGLYCERIN (NITROSTAT) 0.4 MG SL tablet Place 0.4 mg under the tongue every 5 (five) minutes as needed. After 3 doses and no relief call 911  . prasugrel (EFFIENT) 10 MG TABS tablet Take 10 mg by mouth daily. Reported on 10/25/2015  . rosuvastatin (CRESTOR)  40 MG tablet TAKE 1 TABLET BY MOUTH EVERY DAY  . [DISCONTINUED] predniSONE (STERAPRED UNI-PAK 21 TAB) 10 MG (21) TBPK tablet Take 1 tab QID x 2 days, then take 1 tab TID x 2 days, then take 1 tab BID x 2 days, then take 1 tab QD x 2 days.   Facility-Administered Encounter Medications as of 12/04/2015  Medication  . cyanocobalamin ((VITAMIN B-12)) injection 1,000 mcg      Review of Systems  Constitutional: Negative.   HENT: Positive for congestion.   Eyes: Negative.   Respiratory: Positive for cough (better).     Cardiovascular: Negative.   Gastrointestinal: Negative.   Endocrine: Negative.   Genitourinary: Negative.   Musculoskeletal: Negative.   Skin: Negative.   Allergic/Immunologic: Negative.   Neurological: Negative.   Hematological: Negative.   Psychiatric/Behavioral: Negative.        Objective:   Physical Exam  Constitutional: He is oriented to person, place, and time. He appears well-developed and well-nourished.  HENT:  Head: Normocephalic and atraumatic.  Right Ear: External ear normal.  Left Ear: External ear normal.  Nose: Nose normal.  Mouth/Throat: Oropharynx is clear and moist. No oropharyngeal exudate.  Eyes: Conjunctivae and EOM are normal. Pupils are equal, round, and reactive to light. Right eye exhibits no discharge. Left eye exhibits no discharge. No scleral icterus.  Neck: Normal range of motion. Neck supple. No thyromegaly present.  Cardiovascular: Normal rate, regular rhythm and normal heart sounds.   No murmur heard. Pulmonary/Chest: Effort normal and breath sounds normal. No respiratory distress. He has no wheezes. He has no rales. He exhibits no tenderness.  Clear anteriorly and posteriorly with a dry cough  Abdominal: Bowel sounds are normal.  Musculoskeletal: Normal range of motion. He exhibits no edema.  Lymphadenopathy:    He has no cervical adenopathy.  Neurological: He is alert and oriented to person, place, and time.  Skin: Skin is warm and dry. No rash noted. There is erythema.  Slight redness and irritation dorsal left hand  Psychiatric: He has a normal mood and affect. His behavior is normal. Judgment and thought content normal.  Nursing note and vitals reviewed.  BP 156/62 mmHg  Pulse 59  Temp(Src) 96.9 F (36.1 C) (Oral)  Ht '5\' 5"'$  (1.651 m)  Wt 155 lb (70.308 kg)  BMI 25.79 kg/m2        Assessment & Plan:  1. Eczema of left hand - mupirocin cream (BACTROBAN) 2 %; Apply 1 application topically 2 (two) times daily.  Dispense: 15 g;  Refill: 0  2. Bronchitis with bronchospasm -Continue with Breo inhaler and Mucinex with a large glass of water twice daily  Patient Instructions  Continue with the inhaler 1 puff once daily and rinse mouth after using Continue with the Mucinex one twice daily with a large glass of water Avoid large crowds of people and good hand hygiene and cleanliness is important Use the antibiotic ointment for 7-10 days sparingly on the copper the left hand were the irritation has been noted   Arrie Senate MD

## 2015-12-04 NOTE — Patient Instructions (Signed)
Continue with the inhaler 1 puff once daily and rinse mouth after using Continue with the Mucinex one twice daily with a large glass of water Avoid large crowds of people and good hand hygiene and cleanliness is important Use the antibiotic ointment for 7-10 days sparingly on the copper the left hand were the irritation has been noted

## 2015-12-05 ENCOUNTER — Ambulatory Visit: Payer: Medicare Other | Admitting: Pharmacist

## 2015-12-05 MED ORDER — MUPIROCIN 2 % EX OINT: 1.0000 "application " | TOPICAL_OINTMENT | Freq: Two times a day (BID) | CUTANEOUS | Status: DC

## 2015-12-05 NOTE — Addendum Note (Signed)
Addended by: Zannie Cove on: 12/05/2015 10:44 AM   Modules accepted: Orders

## 2015-12-10 ENCOUNTER — Ambulatory Visit (INDEPENDENT_AMBULATORY_CARE_PROVIDER_SITE_OTHER): Payer: Medicare Other | Admitting: Family Medicine

## 2015-12-10 DIAGNOSIS — E538 Deficiency of other specified B group vitamins: Secondary | ICD-10-CM | POA: Diagnosis not present

## 2015-12-10 NOTE — Progress Notes (Signed)
Pt given B12 injection IM right deltoid and tolerated well.

## 2015-12-17 ENCOUNTER — Other Ambulatory Visit: Payer: Self-pay | Admitting: Family Medicine

## 2015-12-17 MED ORDER — ALPRAZOLAM 1 MG PO TABS: ORAL_TABLET | ORAL | Status: DC

## 2015-12-17 MED ORDER — HYDROCODONE-ACETAMINOPHEN 10-325 MG PO TABS: 1.0000 | ORAL_TABLET | Freq: Four times a day (QID) | ORAL | Status: DC | PRN

## 2015-12-17 NOTE — Telephone Encounter (Signed)
Last filled 11/19/15, he has appt on 03/29. Both will print

## 2016-01-02 ENCOUNTER — Encounter: Payer: Self-pay | Admitting: Family Medicine

## 2016-01-02 ENCOUNTER — Ambulatory Visit (INDEPENDENT_AMBULATORY_CARE_PROVIDER_SITE_OTHER): Payer: Medicare Other | Admitting: Family Medicine

## 2016-01-02 VITALS — BP 141/60 | HR 52 | Temp 97.2°F | Ht 65.0 in | Wt 155.0 lb

## 2016-01-02 DIAGNOSIS — E785 Hyperlipidemia, unspecified: Secondary | ICD-10-CM | POA: Diagnosis not present

## 2016-01-02 DIAGNOSIS — N400 Enlarged prostate without lower urinary tract symptoms: Secondary | ICD-10-CM | POA: Diagnosis not present

## 2016-01-02 DIAGNOSIS — I6521 Occlusion and stenosis of right carotid artery: Secondary | ICD-10-CM | POA: Diagnosis not present

## 2016-01-02 DIAGNOSIS — E559 Vitamin D deficiency, unspecified: Secondary | ICD-10-CM | POA: Diagnosis not present

## 2016-01-02 DIAGNOSIS — G620 Drug-induced polyneuropathy: Secondary | ICD-10-CM

## 2016-01-02 DIAGNOSIS — I10 Essential (primary) hypertension: Secondary | ICD-10-CM

## 2016-01-02 DIAGNOSIS — J701 Chronic and other pulmonary manifestations due to radiation: Secondary | ICD-10-CM | POA: Diagnosis not present

## 2016-01-02 DIAGNOSIS — Z85118 Personal history of other malignant neoplasm of bronchus and lung: Secondary | ICD-10-CM | POA: Diagnosis not present

## 2016-01-02 DIAGNOSIS — J301 Allergic rhinitis due to pollen: Secondary | ICD-10-CM | POA: Diagnosis not present

## 2016-01-02 DIAGNOSIS — J9801 Acute bronchospasm: Secondary | ICD-10-CM

## 2016-01-02 DIAGNOSIS — T451X5A Adverse effect of antineoplastic and immunosuppressive drugs, initial encounter: Secondary | ICD-10-CM

## 2016-01-02 DIAGNOSIS — I739 Peripheral vascular disease, unspecified: Secondary | ICD-10-CM

## 2016-01-02 DIAGNOSIS — D696 Thrombocytopenia, unspecified: Secondary | ICD-10-CM

## 2016-01-02 MED ORDER — HYDROCODONE-ACETAMINOPHEN 10-325 MG PO TABS: 1.0000 | ORAL_TABLET | Freq: Four times a day (QID) | ORAL | Status: DC | PRN

## 2016-01-02 MED ORDER — FLUTICASONE PROPIONATE 50 MCG/ACT NA SUSP: 2.0000 | Freq: Every day | NASAL | Status: DC

## 2016-01-02 NOTE — Addendum Note (Signed)
Addended by: Zannie Cove on: 01/02/2016 11:39 AM   Modules accepted: Orders

## 2016-01-02 NOTE — Progress Notes (Signed)
Subjective:    Patient ID: [name], male    DOB: [birth year], 70 y.o.   MRN: [id number]  HPI Pt here for follow up and management of chronic medical problems which includes hypertension and hyperlipidemia. He is taking medications regularly.The patient was in the office the end of February with bronchitis and bronchospasm. He still has some sinus pressure and nasal drainage. The patient comes to the visit today with his wife. He is still recovering from his bronchitis and still has somewhat of a cough at times. He is better. He is not using any respiratory protection from the pollen. He denies any chest pain or significant shortness of breath. He is swallowing without problems not having any heartburn indigestion nausea vomiting diarrhea or blood in the stool. He is passing his water without problems.     Patient Active Problem List   Diagnosis Date Noted  . Right carotid bruit 11/23/2014  . 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 Thrombocytopenia ([place]) 07/17/2014  . Radiation fibrosis of lung ([place]) 07/17/2014  . Peripheral vascular insufficiency ([place]) 07/17/2014  . Peripheral neuropathy due to chemotherapy ([place]) 07/17/2014  . Allergic rhinitis due to pollen 07/17/2014  . Adenocarcinoma of the right upper lobe 03/09/2014  . BPH (benign prostatic hyperplasia) 03/09/2014  . Need for prophylactic vaccination and inoculation against influenza 09/24/2013  . Hyperlipemia 09/24/2013  . Ecchymosis 09/24/2013  . Mild eczema 09/24/2013  . Chronic pain syndrome 06/21/2013  . Anxiety state, unspecified 03/17/2013  . FEMORAL BRUIT 12/04/2010  . HYPERTENSION, BENIGN 12/13/2008  . CAD, ARTERY BYPASS GRAFT 12/13/2008  . 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 MYOCARDIAL INFARCTION 10/16/2008  . Swelling, mass, or lump in chest 10/16/2008   Outpatient Encounter Prescriptions as of 01/02/2016  Medication Sig  . acetaminophen (TYLENOL) 500 MG tablet Take 500-1,000 mg by mouth 2  (two) times daily as needed for moderate pain.  Marland Kitchen ALPRAZolam (XANAX) 1 MG tablet TAKE 1 TABLET 3 TIMES A DAY AS NEEDED FOR ANXIETY  . amLODipine (NORVASC) 10 MG tablet TAKE 1 TABLET EVERY DAY  . aspirin EC 81 MG tablet Take 1 tablet (81 mg total) by mouth daily.  . cholecalciferol (VITAMIN D) 1000 units tablet Take 1,000 Units by mouth daily.  . fenofibrate 160 MG tablet TAKE 1 TABLET (160 MG TOTAL) BY MOUTH DAILY.  Marland Kitchen gabapentin (NEURONTIN) 300 MG capsule TAKE 1 CAPSULE (300 MG TOTAL) BY MOUTH EVERY 8 (EIGHT) HOURS. (Patient taking differently: Take 300 mg by mouth 3 (three) times daily. TAKE 1 CAPSULE (300 MG TOTAL) BY MOUTH EVERY 8 (EIGHT) HOURS.)  . hydrochlorothiazide (MICROZIDE) 12.5 MG capsule Take 2 capsules (25 mg total) by mouth daily.  Marland Kitchen HYDROcodone-acetaminophen (NORCO) 10-325 MG tablet Take 1 tablet by mouth every 6 (six) hours as needed for moderate pain.  . isosorbide mononitrate (IMDUR) 60 MG 24 hr tablet Take 1 tablet (60 mg total) by mouth daily.  Marland Kitchen KLOR-CON M20 20 MEQ tablet TAKE 1 TABLET (20 MEQ TOTAL) BY MOUTH DAILY.  . metoprolol (LOPRESSOR) 50 MG tablet TAKE 1 TABLET (50 MG TOTAL) BY MOUTH 2 (TWO) TIMES DAILY.  . Multiple Vitamin (MULTIVITAMIN) capsule Take 1 capsule by mouth daily.    . mupirocin ointment (BACTROBAN) 2 % Apply 1 application topically 2 (two) times daily.  . nitroGLYCERIN (NITROSTAT) 0.4 MG SL tablet Place 0.4 mg under the tongue every 5 (five) minutes as needed. After 3 doses and no relief call 911  . prasugrel (EFFIENT) 10 MG TABS tablet  Take 10 mg by mouth daily. Reported on 10/25/2015  . rosuvastatin (CRESTOR) 40 MG tablet TAKE 1 TABLET BY MOUTH EVERY DAY  . [DISCONTINUED] EFFIENT 10 MG TABS tablet TAKE 1 TABLET EVERY DAY   Facility-Administered Encounter Medications as of 01/02/2016  Medication  . cyanocobalamin ((VITAMIN B-12)) injection 1,000 mcg     Review of Systems  Constitutional: Negative.   HENT: Positive for postnasal drip and sinus  pressure.   Eyes: Negative.   Respiratory: Negative.   Cardiovascular: Negative.   Gastrointestinal: Negative.   Endocrine: Negative.   Genitourinary: Negative.   Musculoskeletal: Negative.   Skin: Negative.   Allergic/Immunologic: Negative.   Neurological: Negative.   Hematological: Negative.   Psychiatric/Behavioral: Negative.        Objective:   Physical Exam  Constitutional: He is oriented to person, place, and time. He appears well-developed and well-nourished. No distress.  HENT:  Head: Normocephalic and atraumatic.  Right Ear: External ear normal.  Left Ear: External ear normal.  Mouth/Throat: Oropharynx is clear and moist. No oropharyngeal exudate.  Nasal congestion bilaterally  Eyes: Conjunctivae and EOM are normal. Pupils are equal, round, and reactive to light. Right eye exhibits no discharge. Left eye exhibits no discharge. No scleral icterus.  Neck: Normal range of motion. Neck supple. No thyromegaly present.  No carotid bruits  Cardiovascular: Normal rate, regular rhythm, normal heart sounds and intact distal pulses.   No murmur heard. Heart is regular at 60/m  Pulmonary/Chest: Effort normal and breath sounds normal. No respiratory distress. He has no wheezes. He has no rales. He exhibits no tenderness.  Dry cough no rales or wheezes.  Abdominal: Soft. Bowel sounds are normal. He exhibits no mass. There is no tenderness. There is no rebound and no guarding.  Musculoskeletal: Normal range of motion. He exhibits no edema.  Lymphadenopathy:    He has no cervical adenopathy.  Neurological: He is alert and oriented to person, place, and time. He has normal reflexes. No cranial nerve deficit.  Skin: Skin is warm and dry. No rash noted.  Psychiatric: He has a normal mood and affect. His behavior is normal. Judgment and thought content normal.  Nursing note and vitals reviewed.   BP 141/60 mmHg  Pulse 52  Temp(Src) 97.2 F (36.2 C) (Oral)  Ht 5' 5"  (1.651 m)  Wt  155 lb (70.308 kg)  BMI 25.79 kg/m2       Assessment & Plan:  1. Hyperlipemia -Continue current treatment pending results of lab work - CBC with Differential/Platelet - NMR, lipoprofile  2. Vitamin D deficiency -Continue current treatment pending results of lab work - CBC with Differential/Platelet - VITAMIN D 25 Hydroxy (Vit-D Deficiency, Fractures)  3. BPH (benign prostatic hyperplasia) -The patient has no complaints of this today with voiding - CBC with Differential/Platelet  4. HYPERTENSION, BENIGN -The blood pressure slightly elevated today but we will make no changes in his medicine he will continue to monitor blood pressure readings at home and watch sodium intake - CBC with Differential/Platelet - BMP8+EGFR - Hepatic function panel  5. Thrombocytopenia ([place]) -No bleeding issues noted in history. - CBC with Differential/Platelet  6. Radiation fibrosis of lung ([place]) -Practice good pulmonary hygiene  7. Peripheral vascular insufficiency ([facility]) -Continue walking  8. Peripheral neuropathy due to chemotherapy ([place])  9. Claudication [facility]) -This appears to be stable but walking is encouraged  10. Seasonal allergic rhinitis due to pollen -Flonase nasal spray 1 spray each nostril at bedtime and nasal saline during the  day  11. Adenocarcinoma of the right upper lobe -Continue to follow-up with pulmonology  12. Bronchospasm -Good pulmonary hygiene and wear mask when outside  Meds ordered this encounter  Medications  . HYDROcodone-acetaminophen (NORCO) 10-325 MG tablet    Sig: Take 1 tablet by mouth every 6 (six) hours as needed for moderate pain.    Dispense:  120 tablet    Refill:  0    DO NOT FILL UNTIL 01/17/16.   Patient Instructions                       Medicare Annual Wellness Visit  [facility] and the medical providers at [facility] strive to bring you the best medical care.  In doing so we not only want to address your current  medical conditions and concerns but also to detect new conditions early and prevent illness, disease and health-related problems.    Medicare offers a yearly Wellness Visit which allows our clinical staff to assess your need for preventative services including immunizations, lifestyle education, counseling to decrease risk of preventable diseases and screening for fall risk and other medical concerns.    This visit is provided free of charge (no copay) for all Medicare recipients. The clinical pharmacists at [facility] have begun to conduct these Wellness Visits which will also include a thorough review of all your medications.    As you primary medical provider recommend that you make an appointment for your Annual Wellness Visit if you have not done so already this year.  You may set up this appointment before you leave today or you may call back ([phone]) and schedule an appointment.  Please make sure when you call that you mention that you are scheduling your Annual Wellness Visit with the clinical pharmacist so that the appointment may be made for the proper length of time.     Continue current medications. Continue good therapeutic lifestyle changes which include good diet and exercise. Fall precautions discussed with patient. If an FOBT was given today- please return it to our front desk. If you are over 64 years old - you may need Prevnar 85 or the adult Pneumonia vaccine.  **Flu shots are available--- please call and schedule a FLU-CLINIC appointment**  After your visit with [place] today you will receive a survey in the mail or online from [employer] regarding your care with [place]. Please take a moment to fill this out. Your feedback is very important to [place] as you can help [place] better understand your patient needs as well as improve your experience and satisfaction. WE CARE ABOUT YOU!!!   Continue to drink plenty of fluids and stay well hydrated Practice good pulmonary  hygiene and use a mask when outside and when in large crowds of people use good hand cleansing. Use nasal saline frequently and continue to use Mucinex plain, 1 twice daily with a large glass of water We'll call in a prescription for Flonase for you to use at bedtime. 1 spray each nostril once daily as this will help with allergic rhinitis.   Arrie Senate MD

## 2016-01-02 NOTE — Patient Instructions (Addendum)
Medicare Annual Wellness Visit  [facility] and the medical providers at [facility] strive to bring you the best medical care.  In doing so we not only want to address your current medical conditions and concerns but also to detect new conditions early and prevent illness, disease and health-related problems.    Medicare offers a yearly Wellness Visit which allows our clinical staff to assess your need for preventative services including immunizations, lifestyle education, counseling to decrease risk of preventable diseases and screening for fall risk and other medical concerns.    This visit is provided free of charge (no copay) for all Medicare recipients. The clinical pharmacists at [facility] have begun to conduct these Wellness Visits which will also include a thorough review of all your medications.    As you primary medical provider recommend that you make an appointment for your Annual Wellness Visit if you have not done so already this year.  You may set up this appointment before you leave today or you may call back ([phone]) and schedule an appointment.  Please make sure when you call that you mention that you are scheduling your Annual Wellness Visit with the clinical pharmacist so that the appointment may be made for the proper length of time.     Continue current medications. Continue good therapeutic lifestyle changes which include good diet and exercise. Fall precautions discussed with patient. If an FOBT was given today- please return it to our front desk. If you are over 33 years old - you may need Prevnar 34 or the adult Pneumonia vaccine.  **Flu shots are available--- please call and schedule a FLU-CLINIC appointment**  After your visit with [place] today you will receive a survey in the mail or online from [employer] regarding your care with [place]. Please take a moment to fill this out. Your feedback is very  important to [place] as you can help [place] better understand your patient needs as well as improve your experience and satisfaction. WE CARE ABOUT YOU!!!   Continue to drink plenty of fluids and stay well hydrated Practice good pulmonary hygiene and use a mask when outside and when in large crowds of people use good hand cleansing. Use nasal saline frequently and continue to use Mucinex plain, 1 twice daily with a large glass of water We'll call in a prescription for Flonase for you to use at bedtime. 1 spray each nostril once daily as this will help with allergic rhinitis.

## 2016-01-03 LAB — BMP8+EGFR
BUN/Creatinine Ratio: 9 — ABNORMAL LOW (ref 10–22)
BUN: 7 mg/dL — ABNORMAL LOW (ref 8–27)
CO2: 21 mmol/L (ref 18–29)
Calcium: 9 mg/dL (ref 8.6–10.2)
Chloride: 102 mmol/L (ref 96–106)
Creatinine, Ser: 0.81 mg/dL (ref 0.76–1.27)
GFR calc Af Amer: 105 mL/min/{1.73_m2} (ref >59)
GFR calc non Af Amer: 91 mL/min/{1.73_m2} (ref >59)
GLUCOSE: 90 mg/dL (ref 65–99)
POTASSIUM: 4.1 mmol/L (ref 3.5–5.2)
SODIUM: 142 mmol/L (ref 134–144)

## 2016-01-03 LAB — NMR, LIPOPROFILE
Cholesterol: 124 mg/dL (ref 100–199)
HDL Cholesterol by NMR: 38 mg/dL — ABNORMAL LOW (ref >39)
HDL Particle Number: 34.2 umol/L (ref >=30.5)
LDL PARTICLE NUMBER: 1095 nmol/L — AB (ref <1000)
LDL SIZE: 20.5 nm (ref >20.5)
LDL-C: 65 mg/dL (ref 0–99)
LP-IR Score: 69 — ABNORMAL HIGH (ref <=45)
SMALL LDL PARTICLE NUMBER: 580 nmol/L — AB (ref <=527)
Triglycerides by NMR: 105 mg/dL (ref 0–149)

## 2016-01-03 LAB — CBC WITH DIFFERENTIAL/PLATELET
BASOS ABS: 0 10*3/uL (ref 0.0–0.2)
Basos: 1 %
EOS (ABSOLUTE): 0.1 10*3/uL (ref 0.0–0.4)
Eos: 2 %
Hematocrit: 38.3 % (ref 37.5–51.0)
Hemoglobin: 12.8 g/dL (ref 12.6–17.7)
Immature Grans (Abs): 0 10*3/uL (ref 0.0–0.1)
Immature Granulocytes: 0 %
LYMPHS ABS: 0.9 10*3/uL (ref 0.7–3.1)
Lymphs: 18 %
MCH: 28.9 pg (ref 26.6–33.0)
MCHC: 33.4 g/dL (ref 31.5–35.7)
MCV: 87 fL (ref 79–97)
MONOCYTES: 9 %
MONOS ABS: 0.5 10*3/uL (ref 0.1–0.9)
Neutrophils Absolute: 3.5 10*3/uL (ref 1.4–7.0)
Neutrophils: 70 %
Platelets: 160 10*3/uL (ref 150–379)
RBC: 4.43 x10E6/uL (ref 4.14–5.80)
RDW: 15.1 % (ref 12.3–15.4)
WBC: 4.9 10*3/uL (ref 3.4–10.8)

## 2016-01-03 LAB — HEPATIC FUNCTION PANEL
ALBUMIN: 4.5 g/dL (ref 3.6–4.8)
ALT: 16 IU/L (ref 0–44)
AST: 17 IU/L (ref 0–40)
Alkaline Phosphatase: 46 IU/L (ref 39–117)
Bilirubin Total: 0.3 mg/dL (ref 0.0–1.2)
Bilirubin, Direct: 0.11 mg/dL (ref 0.00–0.40)
TOTAL PROTEIN: 6.3 g/dL (ref 6.0–8.5)

## 2016-01-03 LAB — VITAMIN D 25 HYDROXY (VIT D DEFICIENCY, FRACTURES): VIT D 25 HYDROXY: 53.9 ng/mL (ref 30.0–100.0)

## 2016-01-11 ENCOUNTER — Ambulatory Visit (INDEPENDENT_AMBULATORY_CARE_PROVIDER_SITE_OTHER): Payer: Medicare Other | Admitting: *Deleted

## 2016-01-11 ENCOUNTER — Ambulatory Visit: Payer: Medicare Other

## 2016-01-11 DIAGNOSIS — E538 Deficiency of other specified B group vitamins: Secondary | ICD-10-CM

## 2016-01-11 NOTE — Patient Instructions (Signed)

## 2016-01-11 NOTE — Progress Notes (Signed)
Vitamin b12 injection given and tolerated well.  

## 2016-01-29 ENCOUNTER — Other Ambulatory Visit: Payer: Self-pay | Admitting: Cardiovascular Disease

## 2016-02-04 ENCOUNTER — Other Ambulatory Visit: Payer: Self-pay | Admitting: Cardiovascular Disease

## 2016-02-11 ENCOUNTER — Other Ambulatory Visit: Payer: Self-pay | Admitting: Family Medicine

## 2016-02-11 ENCOUNTER — Telehealth: Payer: Self-pay | Admitting: Family Medicine

## 2016-02-11 MED ORDER — HYDROCODONE-ACETAMINOPHEN 10-325 MG PO TABS: 1.0000 | ORAL_TABLET | Freq: Four times a day (QID) | ORAL | Status: DC | PRN

## 2016-02-11 NOTE — Telephone Encounter (Signed)
rx printed  - pt aware

## 2016-02-11 NOTE — Telephone Encounter (Signed)
Please be aware

## 2016-02-12 ENCOUNTER — Ambulatory Visit (INDEPENDENT_AMBULATORY_CARE_PROVIDER_SITE_OTHER): Payer: Medicare Other | Admitting: *Deleted

## 2016-02-12 DIAGNOSIS — E538 Deficiency of other specified B group vitamins: Secondary | ICD-10-CM

## 2016-02-12 NOTE — Patient Instructions (Signed)

## 2016-02-12 NOTE — Progress Notes (Signed)
Vitamin b12 injection given and tolerated well.  

## 2016-02-25 ENCOUNTER — Other Ambulatory Visit: Payer: Self-pay | Admitting: Family Medicine

## 2016-03-11 ENCOUNTER — Telehealth: Payer: Self-pay | Admitting: Family Medicine

## 2016-03-11 MED ORDER — HYDROCODONE-ACETAMINOPHEN 10-325 MG PO TABS: 1.0000 | ORAL_TABLET | Freq: Four times a day (QID) | ORAL | Status: DC | PRN

## 2016-03-11 MED ORDER — ALPRAZOLAM 1 MG PO TABS: ORAL_TABLET | ORAL | Status: DC

## 2016-03-11 NOTE — Telephone Encounter (Signed)
Pt aware xanax phoned in and pain rx up front for pick up

## 2016-03-14 ENCOUNTER — Ambulatory Visit (INDEPENDENT_AMBULATORY_CARE_PROVIDER_SITE_OTHER): Payer: Medicare Other | Admitting: *Deleted

## 2016-03-14 DIAGNOSIS — E538 Deficiency of other specified B group vitamins: Secondary | ICD-10-CM | POA: Diagnosis not present

## 2016-03-14 NOTE — Patient Instructions (Signed)

## 2016-03-14 NOTE — Progress Notes (Signed)
Vitamin b12 injection given and patient tolerated well.  

## 2016-03-24 NOTE — Progress Notes (Signed)
Erroneously entered in as office visit

## 2016-04-14 ENCOUNTER — Ambulatory Visit (INDEPENDENT_AMBULATORY_CARE_PROVIDER_SITE_OTHER): Payer: Medicare Other | Admitting: *Deleted

## 2016-04-14 ENCOUNTER — Other Ambulatory Visit: Payer: Self-pay | Admitting: Family Medicine

## 2016-04-14 DIAGNOSIS — E538 Deficiency of other specified B group vitamins: Secondary | ICD-10-CM

## 2016-04-14 MED ORDER — HYDROCODONE-ACETAMINOPHEN 10-325 MG PO TABS: 1.0000 | ORAL_TABLET | Freq: Four times a day (QID) | ORAL | Status: DC | PRN

## 2016-04-14 NOTE — Telephone Encounter (Signed)
Patient aware that rx ready to be picked up.

## 2016-04-14 NOTE — Telephone Encounter (Signed)
Last filled 03/11/16. Please advise

## 2016-04-14 NOTE — Progress Notes (Signed)
Pt given B12 injection IM left deltoid and tolerated well. °

## 2016-04-14 NOTE — Telephone Encounter (Signed)
Ok to refill, will need to talk to Dr Laurance Flatten for next refill

## 2016-05-12 ENCOUNTER — Other Ambulatory Visit: Payer: Self-pay | Admitting: Family Medicine

## 2016-05-12 ENCOUNTER — Telehealth: Payer: Self-pay | Admitting: Family Medicine

## 2016-05-12 NOTE — Telephone Encounter (Signed)
Last filled 04/11/16, last seen 01/02/16. [place] pt. Route to pool

## 2016-05-12 NOTE — Telephone Encounter (Signed)
Please forward to Dr. Laurance Flatten, inform patient that they will have to plan ahead a little bit more so they do not wait until the last day because he is not here today

## 2016-05-14 MED ORDER — HYDROCODONE-ACETAMINOPHEN 10-325 MG PO TABS: 1.0000 | ORAL_TABLET | Freq: Four times a day (QID) | ORAL | 0 refills | Status: DC | PRN

## 2016-05-14 NOTE — Telephone Encounter (Signed)
Script ready - aware

## 2016-05-16 ENCOUNTER — Ambulatory Visit (INDEPENDENT_AMBULATORY_CARE_PROVIDER_SITE_OTHER): Payer: Medicare Other

## 2016-05-16 DIAGNOSIS — E538 Deficiency of other specified B group vitamins: Secondary | ICD-10-CM | POA: Diagnosis not present

## 2016-05-16 MED ORDER — CYANOCOBALAMIN 1000 MCG/ML IJ SOLN
1000.0000 ug | INTRAMUSCULAR | Status: AC
  Administered 2016-05-16 – 2017-03-30 (×11): 1000 ug via INTRAMUSCULAR

## 2016-05-19 ENCOUNTER — Encounter: Payer: Self-pay | Admitting: Family Medicine

## 2016-05-19 ENCOUNTER — Ambulatory Visit (INDEPENDENT_AMBULATORY_CARE_PROVIDER_SITE_OTHER): Payer: Medicare Other | Admitting: Family Medicine

## 2016-05-19 ENCOUNTER — Other Ambulatory Visit: Payer: Self-pay | Admitting: Family Medicine

## 2016-05-19 VITALS — BP 135/62 | HR 52 | Temp 96.4°F | Ht 65.0 in | Wt 154.0 lb

## 2016-05-19 DIAGNOSIS — E538 Deficiency of other specified B group vitamins: Secondary | ICD-10-CM

## 2016-05-19 DIAGNOSIS — D696 Thrombocytopenia, unspecified: Secondary | ICD-10-CM

## 2016-05-19 DIAGNOSIS — Z85118 Personal history of other malignant neoplasm of bronchus and lung: Secondary | ICD-10-CM | POA: Diagnosis not present

## 2016-05-19 DIAGNOSIS — E559 Vitamin D deficiency, unspecified: Secondary | ICD-10-CM

## 2016-05-19 DIAGNOSIS — G620 Drug-induced polyneuropathy: Secondary | ICD-10-CM

## 2016-05-19 DIAGNOSIS — I6521 Occlusion and stenosis of right carotid artery: Secondary | ICD-10-CM | POA: Diagnosis not present

## 2016-05-19 DIAGNOSIS — I739 Peripheral vascular disease, unspecified: Secondary | ICD-10-CM

## 2016-05-19 DIAGNOSIS — E785 Hyperlipidemia, unspecified: Secondary | ICD-10-CM | POA: Diagnosis not present

## 2016-05-19 DIAGNOSIS — I10 Essential (primary) hypertension: Secondary | ICD-10-CM

## 2016-05-19 DIAGNOSIS — N400 Enlarged prostate without lower urinary tract symptoms: Secondary | ICD-10-CM | POA: Diagnosis not present

## 2016-05-19 DIAGNOSIS — T451X5A Adverse effect of antineoplastic and immunosuppressive drugs, initial encounter: Secondary | ICD-10-CM | POA: Diagnosis not present

## 2016-05-19 NOTE — Progress Notes (Signed)
Subjective:    Patient ID: [name], male    DOB: 1946/06/18, 70 y.o.   MRN: [id number]  HPI Pt here for follow up and management of chronic medical problems which includes hyperlipidemia. He is taking medications regularly.This patient does have a history of a CABG and lung cancer. He is also had a carotid endarterectomy. He has claudication thrombocytopenia radiation fibrosis and peripheral vascular insufficiency as well as peripheral neuropathy secondary to chemotherapy. He comes to the visit today with his wife. His blood pressure was good at 135/62 and weight is stable. The patient is also followed by the vascular surgeon for his carotid stenosis and gets Dopplers done through him. He sees his cardiologist every 6 months and this is Dr. Burt Knack. He is also followed by the radiation oncologist, Dr. Wilburt Finlay worth and she sees him yearly. He denies any chest pain tightness pressure or shortness of breath. He has no trouble with swallowing heartburn indigestion nausea vomiting diarrhea or blood in the stool. He is passing his water without problems.     Patient Active Problem List   Diagnosis Date Noted  . Right carotid bruit 11/23/2014  . Claudication ([place]) 11/23/2014  . Thrombocytopenia ([place]) 07/17/2014  . Radiation fibrosis of lung ([place]) 07/17/2014  . Peripheral vascular insufficiency ([place]) 07/17/2014  . Peripheral neuropathy due to chemotherapy ([place]) 07/17/2014  . Allergic rhinitis due to pollen 07/17/2014  . Adenocarcinoma of the right upper lobe 03/09/2014  . BPH (benign prostatic hyperplasia) 03/09/2014  . Need for prophylactic vaccination and inoculation against influenza 09/24/2013  . Hyperlipemia 09/24/2013  . Ecchymosis 09/24/2013  . Mild eczema 09/24/2013  . Chronic pain syndrome 06/21/2013  . Anxiety state, unspecified 03/17/2013  . FEMORAL BRUIT 12/04/2010  . HYPERTENSION, BENIGN 12/13/2008  . CAD, ARTERY BYPASS GRAFT 12/13/2008  . CAROTID ARTERY STENOSIS, WITHOUT  INFARCTION 12/13/2008  . MYOCARDIAL INFARCTION 10/16/2008  . Swelling, mass, or lump in chest 10/16/2008   Outpatient Encounter Prescriptions as of 05/19/2016  Medication Sig  . acetaminophen (TYLENOL) 500 MG tablet Take 500-1,000 mg by mouth 2 (two) times daily as needed for moderate pain.  Marland Kitchen ALPRAZolam (XANAX) 1 MG tablet TAKE 1 TAB 3 TIMESD AILY AS NEEDED  . amLODipine (NORVASC) 10 MG tablet TAKE 1 TABLET EVERY DAY  . aspirin EC 81 MG tablet Take 1 tablet (81 mg total) by mouth daily.  . cholecalciferol (VITAMIN D) 1000 units tablet Take 1,000 Units by mouth daily.  Marland Kitchen EFFIENT 10 MG TABS tablet TAKE 1 TABLET EVERY DAY  . fenofibrate 160 MG tablet TAKE 1 TABLET (160 MG TOTAL) BY MOUTH DAILY.  . fluticasone (FLONASE) 50 MCG/ACT nasal spray Place 2 sprays into both nostrils daily.  Marland Kitchen gabapentin (NEURONTIN) 300 MG capsule TAKE ONE CAPSULE BY MOUTH EVERY 8 HOURS  . hydrochlorothiazide (MICROZIDE) 12.5 MG capsule Take 2 capsules (25 mg total) by mouth daily.  Marland Kitchen HYDROcodone-acetaminophen (NORCO) 10-325 MG tablet Take 1 tablet by mouth every 6 (six) hours as needed for moderate pain.  . isosorbide mononitrate (IMDUR) 60 MG 24 hr tablet Take 1 tablet (60 mg total) by mouth daily.  Marland Kitchen KLOR-CON M20 20 MEQ tablet TAKE 1 TABLET (20 MEQ TOTAL) BY MOUTH DAILY.  . metoprolol (LOPRESSOR) 50 MG tablet TAKE 1 TABLET (50 MG TOTAL) BY MOUTH 2 (TWO) TIMES DAILY.  . Multiple Vitamin (MULTIVITAMIN) capsule Take 1 capsule by mouth daily.    . mupirocin ointment (BACTROBAN) 2 % Apply 1 application topically 2 (two) times daily.  Marland Kitchen  nitroGLYCERIN (NITROSTAT) 0.4 MG SL tablet Place 0.4 mg under the tongue every 5 (five) minutes as needed. After 3 doses and no relief call 911  . prasugrel (EFFIENT) 10 MG TABS tablet Take 10 mg by mouth daily. Reported on 10/25/2015  . rosuvastatin (CRESTOR) 40 MG tablet TAKE 1 TABLET BY MOUTH EVERY DAY  . [DISCONTINUED] nitroGLYCERIN (NITROSTAT) 0.4 MG SL tablet PLACE 1 TABLET (0.4 MG  TOTAL) UNDER THE TONGUE EVERY 5 (FIVE) MINUTES AS NEEDED.   Facility-Administered Encounter Medications as of 05/19/2016  Medication  . cyanocobalamin ((VITAMIN B-12)) injection 1,000 mcg      Review of Systems  Constitutional: Negative.   HENT: Negative.   Eyes: Negative.   Respiratory: Negative.   Cardiovascular: Negative.   Gastrointestinal: Negative.   Endocrine: Negative.   Genitourinary: Negative.   Musculoskeletal: Negative.   Skin: Negative.   Allergic/Immunologic: Negative.   Neurological: Negative.   Hematological: Negative.   Psychiatric/Behavioral: Negative.        Objective:   Physical Exam  Constitutional: He is oriented to person, place, and time. He appears well-developed and well-nourished. No distress.  HENT:  Head: Normocephalic and atraumatic.  Right Ear: External ear normal.  Nose: Nose normal.  Mouth/Throat: Oropharynx is clear and moist. No oropharyngeal exudate.  Periodontal disease Minimal ears cerumen left side  Eyes: Conjunctivae and EOM are normal. Pupils are equal, round, and reactive to light. Right eye exhibits no discharge. Left eye exhibits no discharge. No scleral icterus.  Neck: Normal range of motion. Neck supple. No thyromegaly present.  Bilateral carotid bruits. Previous carotid endarterectomy on the right  Cardiovascular: Normal rate, regular rhythm and normal heart sounds.   No murmur heard. Distal pulses were difficult to palpate The heart has a regular rate and rhythm at 60/m  Pulmonary/Chest: Effort normal and breath sounds normal. No respiratory distress. He has no wheezes. He has no rales. He exhibits no tenderness.  Lungs were clear anteriorly and posteriorly and there is no axillary adenopathy  Abdominal: Soft. Bowel sounds are normal. He exhibits no mass. There is no tenderness. There is no rebound and no guarding.  No abdominal tenderness masses or bruits.  Genitourinary: Rectum normal and penis normal.  Genitourinary  Comments: The prostate was enlarged but soft and smooth. There are no rectal masses. The external genitalia were within normal limits and no inguinal hernias were palpable.  Musculoskeletal: Normal range of motion. He exhibits no edema.  Lymphadenopathy:    He has no cervical adenopathy.  Neurological: He is alert and oriented to person, place, and time. He has normal reflexes. No cranial nerve deficit.  Skin: Skin is warm and dry. No rash noted.  Psychiatric: He has a normal mood and affect. His behavior is normal. Judgment and thought content normal.  Despite his history of anxiety he seemed to be very calm and content with his current health condition.  Nursing note and vitals reviewed.  BP (!) 141/67 (BP Location: Right Arm)   Pulse (!) 50   Temp (!) 96.4 F (35.8 C) (Oral)   Ht 5' 5"  (1.651 m)   Wt 154 lb (69.9 kg)   BMI 25.63 kg/m         Assessment & Plan:  1. Hyperlipemia -Continue with current statin therapy and with as aggressive therapeutic lifestyle changes as possible - CBC with Differential/Platelet - NMR, lipoprofile  2. Vitamin D deficiency -Continue with vitamin D replacement pending results of lab work - CBC with Differential/Platelet - VITAMIN D  25 Hydroxy (Vit-D Deficiency, Fractures)  3. Vitamin B12 deficiency -Continue with current B12 replacement pending results of lab work - CBC with Differential/Platelet  4. BPH (benign prostatic hyperplasia) -The patient continues to have BPH but no specific symptoms related to this. The prostate was smooth without masses. - CBC with Differential/Platelet - PSA, total and free - Urinalysis, Complete  5. HYPERTENSION, BENIGN -The second blood pressure today was good and within normal limits. - BMP8+EGFR - CBC with Differential/Platelet - Hepatic function panel  6. Thrombocytopenia ([place]) -The patient denies any bleeding issues - CBC with Differential/Platelet  7. Peripheral neuropathy due to chemotherapy  [facility]) -He continues to have numbness and tingling in his legs at times secondary to chemotherapy for his lung cancer  8. Peripheral vascular insufficiency ([facility]) -He has diminished pulses in both legs.  9. Claudication [facility]) -He walks regularly and is not limited by the claudication.  10. Adenocarcinoma of the right upper lobe -Continue to follow-up with radiation oncologist  Patient Instructions                       Medicare Annual Wellness Visit  [facility] and the medical providers at [facility] strive to bring you the best medical care.  In doing so we not only want to address your current medical conditions and concerns but also to detect new conditions early and prevent illness, disease and health-related problems.    Medicare offers a yearly Wellness Visit which allows our clinical staff to assess your need for preventative services including immunizations, lifestyle education, counseling to decrease risk of preventable diseases and screening for fall risk and other medical concerns.    This visit is provided free of charge (no copay) for all Medicare recipients. The clinical pharmacists at [facility] have begun to conduct these Wellness Visits which will also include a thorough review of all your medications.    As you primary medical provider recommend that you make an appointment for your Annual Wellness Visit if you have not done so already this year.  You may set up this appointment before you leave today or you may call back ([phone]) and schedule an appointment.  Please make sure when you call that you mention that you are scheduling your Annual Wellness Visit with the clinical pharmacist so that the appointment may be made for the proper length of time.     Continue current medications. Continue good therapeutic lifestyle changes which include good diet and exercise. Fall precautions discussed with patient. If an FOBT was  given today- please return it to our front desk. If you are over 78 years old - you may need Prevnar 72 or the adult Pneumonia vaccine.   After your visit with [place] today you will receive a survey in the mail or online from [employer] regarding your care with [place]. Please take a moment to fill this out. Your feedback is very important to [place] as you can help [place] better understand your patient needs as well as improve your experience and satisfaction. WE CARE ABOUT YOU!!!   The patient should continue to follow-up with his radiation oncologist The patient should continue to follow-up with his cardiologist every 6 months He should follow-up with vascular surgery because of bilateral carotid bruits and get Dopplers as recommended    Arrie Senate MD

## 2016-05-19 NOTE — Patient Instructions (Addendum)
Medicare Annual Wellness Visit  [facility] and the medical providers at [facility] strive to bring you the best medical care.  In doing so we not only want to address your current medical conditions and concerns but also to detect new conditions early and prevent illness, disease and health-related problems.    Medicare offers a yearly Wellness Visit which allows our clinical staff to assess your need for preventative services including immunizations, lifestyle education, counseling to decrease risk of preventable diseases and screening for fall risk and other medical concerns.    This visit is provided free of charge (no copay) for all Medicare recipients. The clinical pharmacists at [facility] have begun to conduct these Wellness Visits which will also include a thorough review of all your medications.    As you primary medical provider recommend that you make an appointment for your Annual Wellness Visit if you have not done so already this year.  You may set up this appointment before you leave today or you may call back ([phone]) and schedule an appointment.  Please make sure when you call that you mention that you are scheduling your Annual Wellness Visit with the clinical pharmacist so that the appointment may be made for the proper length of time.     Continue current medications. Continue good therapeutic lifestyle changes which include good diet and exercise. Fall precautions discussed with patient. If an FOBT was given today- please return it to our front desk. If you are over 62 years old - you may need Prevnar 82 or the adult Pneumonia vaccine.   After your visit with [place] today you will receive a survey in the mail or online from [employer] regarding your care with [place]. Please take a moment to fill this out. Your feedback is very important to [place] as you can help [place] better understand your patient needs as well as  improve your experience and satisfaction. WE CARE ABOUT YOU!!!   The patient should continue to follow-up with his radiation oncologist The patient should continue to follow-up with his cardiologist every 6 months He should follow-up with vascular surgery because of bilateral carotid bruits and get Dopplers as recommended

## 2016-05-20 LAB — PSA, TOTAL AND FREE
PSA, Free Pct: 50 %
PSA, Free: 0.2 ng/mL
Prostate Specific Ag, Serum: 0.4 ng/mL (ref 0.0–4.0)

## 2016-05-20 LAB — CBC WITH DIFFERENTIAL/PLATELET
BASOS ABS: 0 10*3/uL (ref 0.0–0.2)
Basos: 0 %
EOS (ABSOLUTE): 0.2 10*3/uL (ref 0.0–0.4)
Eos: 4 %
HEMATOCRIT: 37.9 % (ref 37.5–51.0)
Hemoglobin: 12.4 g/dL — ABNORMAL LOW (ref 12.6–17.7)
Immature Grans (Abs): 0 10*3/uL (ref 0.0–0.1)
Immature Granulocytes: 0 %
LYMPHS: 16 %
Lymphocytes Absolute: 0.7 10*3/uL (ref 0.7–3.1)
MCH: 29 pg (ref 26.6–33.0)
MCHC: 32.7 g/dL (ref 31.5–35.7)
MCV: 89 fL (ref 79–97)
MONOCYTES: 15 %
Monocytes Absolute: 0.6 10*3/uL (ref 0.1–0.9)
Neutrophils Absolute: 2.7 10*3/uL (ref 1.4–7.0)
Neutrophils: 65 %
PLATELETS: 122 10*3/uL — AB (ref 150–379)
RBC: 4.28 x10E6/uL (ref 4.14–5.80)
RDW: 14.1 % (ref 12.3–15.4)
WBC: 4.2 10*3/uL (ref 3.4–10.8)

## 2016-05-20 LAB — URINALYSIS, COMPLETE
BILIRUBIN UA: NEGATIVE
GLUCOSE, UA: NEGATIVE
KETONES UA: NEGATIVE
Leukocytes, UA: NEGATIVE
NITRITE UA: NEGATIVE
PROTEIN UA: NEGATIVE
RBC, UA: NEGATIVE
SPEC GRAV UA: 1.015 (ref 1.005–1.030)
UUROB: 1 mg/dL (ref 0.2–1.0)
pH, UA: 5.5 (ref 5.0–7.5)

## 2016-05-20 LAB — BMP8+EGFR
BUN/Creatinine Ratio: 11 (ref 10–24)
BUN: 10 mg/dL (ref 8–27)
CALCIUM: 8.8 mg/dL (ref 8.6–10.2)
CHLORIDE: 101 mmol/L (ref 96–106)
CO2: 25 mmol/L (ref 18–29)
Creatinine, Ser: 0.89 mg/dL (ref 0.76–1.27)
GFR calc Af Amer: 100 mL/min/{1.73_m2} (ref >59)
GFR calc non Af Amer: 87 mL/min/{1.73_m2} (ref >59)
GLUCOSE: 92 mg/dL (ref 65–99)
POTASSIUM: 3.7 mmol/L (ref 3.5–5.2)
Sodium: 142 mmol/L (ref 134–144)

## 2016-05-20 LAB — HEPATIC FUNCTION PANEL
ALBUMIN: 4.2 g/dL (ref 3.5–4.8)
ALT: 16 IU/L (ref 0–44)
AST: 21 IU/L (ref 0–40)
Alkaline Phosphatase: 42 IU/L (ref 39–117)
BILIRUBIN TOTAL: 0.5 mg/dL (ref 0.0–1.2)
Bilirubin, Direct: 0.14 mg/dL (ref 0.00–0.40)
TOTAL PROTEIN: 6.2 g/dL (ref 6.0–8.5)

## 2016-05-20 LAB — NMR, LIPOPROFILE
CHOLESTEROL: 104 mg/dL (ref 100–199)
HDL CHOLESTEROL BY NMR: 32 mg/dL — AB (ref >39)
HDL PARTICLE NUMBER: 32.8 umol/L (ref >=30.5)
LDL Particle Number: 900 nmol/L (ref <1000)
LDL Size: 19.9 nm (ref >20.5)
LDL-C: 47 mg/dL (ref 0–99)
LP-IR Score: 73 — ABNORMAL HIGH (ref <=45)
SMALL LDL PARTICLE NUMBER: 591 nmol/L — AB (ref <=527)
TRIGLYCERIDES BY NMR: 126 mg/dL (ref 0–149)

## 2016-05-20 LAB — MICROSCOPIC EXAMINATION
BACTERIA UA: NONE SEEN
Epithelial Cells (non renal): NONE SEEN /hpf (ref 0–10)
RBC, UA: NONE SEEN /hpf (ref 0–?)
WBC UA: NONE SEEN /HPF (ref 0–?)

## 2016-05-20 LAB — VITAMIN D 25 HYDROXY (VIT D DEFICIENCY, FRACTURES): VIT D 25 HYDROXY: 52.5 ng/mL (ref 30.0–100.0)

## 2016-06-01 ENCOUNTER — Other Ambulatory Visit: Payer: Self-pay | Admitting: Family Medicine

## 2016-06-02 ENCOUNTER — Other Ambulatory Visit: Payer: Self-pay | Admitting: Family Medicine

## 2016-06-11 ENCOUNTER — Telehealth: Payer: Self-pay | Admitting: Family Medicine

## 2016-06-13 ENCOUNTER — Telehealth: Payer: Self-pay | Admitting: Family Medicine

## 2016-06-13 MED ORDER — HYDROCODONE-ACETAMINOPHEN 10-325 MG PO TABS: 1.0000 | ORAL_TABLET | Freq: Four times a day (QID) | ORAL | 0 refills | Status: DC | PRN

## 2016-06-13 NOTE — Telephone Encounter (Signed)
Aware  - [facility] will sign on Monday and they can pick up on Monday 06/16/16

## 2016-06-16 ENCOUNTER — Other Ambulatory Visit: Payer: Self-pay | Admitting: Family Medicine

## 2016-06-17 ENCOUNTER — Ambulatory Visit (INDEPENDENT_AMBULATORY_CARE_PROVIDER_SITE_OTHER): Payer: Medicare Other | Admitting: *Deleted

## 2016-06-17 DIAGNOSIS — E538 Deficiency of other specified B group vitamins: Secondary | ICD-10-CM | POA: Diagnosis not present

## 2016-06-17 NOTE — Progress Notes (Signed)
Pt given Vit B12 1057mg Tolerated well

## 2016-07-02 ENCOUNTER — Other Ambulatory Visit: Payer: Self-pay | Admitting: Cardiovascular Disease

## 2016-07-02 DIAGNOSIS — I10 Essential (primary) hypertension: Secondary | ICD-10-CM

## 2016-07-02 DIAGNOSIS — R0602 Shortness of breath: Secondary | ICD-10-CM

## 2016-07-02 DIAGNOSIS — E785 Hyperlipidemia, unspecified: Secondary | ICD-10-CM

## 2016-07-02 NOTE — Telephone Encounter (Signed)
Rx(s) sent to pharmacy electronically.  

## 2016-07-02 NOTE — Telephone Encounter (Signed)
This is Dr. Gwenlyn Found pt. Please advise

## 2016-07-08 ENCOUNTER — Other Ambulatory Visit: Payer: Self-pay | Admitting: Family Medicine

## 2016-07-08 ENCOUNTER — Other Ambulatory Visit: Payer: Self-pay | Admitting: Cardiovascular Disease

## 2016-07-09 ENCOUNTER — Other Ambulatory Visit: Payer: Self-pay | Admitting: *Deleted

## 2016-07-09 MED ORDER — POTASSIUM CHLORIDE CRYS ER 20 MEQ PO TBCR: 20.0000 meq | EXTENDED_RELEASE_TABLET | Freq: Every day | ORAL | 11 refills | Status: AC

## 2016-07-14 ENCOUNTER — Other Ambulatory Visit: Payer: Self-pay | Admitting: Family Medicine

## 2016-07-14 MED ORDER — HYDROCODONE-ACETAMINOPHEN 10-325 MG PO TABS: 1.0000 | ORAL_TABLET | Freq: Four times a day (QID) | ORAL | 0 refills | Status: DC | PRN

## 2016-07-14 NOTE — Telephone Encounter (Signed)
Last filled 06/13/16, last seen 05/19/16

## 2016-07-18 ENCOUNTER — Ambulatory Visit (INDEPENDENT_AMBULATORY_CARE_PROVIDER_SITE_OTHER): Payer: Medicare Other | Admitting: *Deleted

## 2016-07-18 DIAGNOSIS — Z23 Encounter for immunization: Secondary | ICD-10-CM | POA: Diagnosis not present

## 2016-07-18 DIAGNOSIS — E538 Deficiency of other specified B group vitamins: Secondary | ICD-10-CM | POA: Diagnosis not present

## 2016-07-18 NOTE — Progress Notes (Signed)
Pt given Vit B12 and influenza vaccine Tolerated well

## 2016-07-24 ENCOUNTER — Other Ambulatory Visit: Payer: Self-pay | Admitting: Cardiovascular Disease

## 2016-08-03 ENCOUNTER — Other Ambulatory Visit: Payer: Self-pay | Admitting: Cardiovascular Disease

## 2016-08-03 DIAGNOSIS — E785 Hyperlipidemia, unspecified: Secondary | ICD-10-CM

## 2016-08-03 DIAGNOSIS — R0602 Shortness of breath: Secondary | ICD-10-CM

## 2016-08-03 DIAGNOSIS — I10 Essential (primary) hypertension: Secondary | ICD-10-CM

## 2016-08-04 ENCOUNTER — Other Ambulatory Visit: Payer: Self-pay | Admitting: Cardiovascular Disease

## 2016-08-04 DIAGNOSIS — I10 Essential (primary) hypertension: Secondary | ICD-10-CM

## 2016-08-04 DIAGNOSIS — R0602 Shortness of breath: Secondary | ICD-10-CM

## 2016-08-04 DIAGNOSIS — E785 Hyperlipidemia, unspecified: Secondary | ICD-10-CM

## 2016-08-04 NOTE — Telephone Encounter (Signed)
Dr. Berry pt. °

## 2016-08-07 ENCOUNTER — Other Ambulatory Visit: Payer: Self-pay | Admitting: Cardiovascular Disease

## 2016-08-07 DIAGNOSIS — I10 Essential (primary) hypertension: Secondary | ICD-10-CM

## 2016-08-07 DIAGNOSIS — R0602 Shortness of breath: Secondary | ICD-10-CM

## 2016-08-07 DIAGNOSIS — E785 Hyperlipidemia, unspecified: Secondary | ICD-10-CM

## 2016-08-09 ENCOUNTER — Other Ambulatory Visit: Payer: Self-pay | Admitting: Family Medicine

## 2016-08-12 ENCOUNTER — Other Ambulatory Visit: Payer: Self-pay | Admitting: *Deleted

## 2016-08-12 MED ORDER — HYDROCODONE-ACETAMINOPHEN 10-325 MG PO TABS: 1.0000 | ORAL_TABLET | Freq: Four times a day (QID) | ORAL | 0 refills | Status: DC | PRN

## 2016-08-13 ENCOUNTER — Ambulatory Visit (HOSPITAL_COMMUNITY)
Admission: RE | Admit: 2016-08-13 | Discharge: 2016-08-13 | Disposition: A | Discharge status: Home / Self Care | Payer: Medicare Other | Source: Ambulatory Visit | Attending: Radiation Oncology | Admitting: Radiation Oncology

## 2016-08-13 DIAGNOSIS — C3411 Malignant neoplasm of upper lobe, right bronchus or lung: Secondary | ICD-10-CM | POA: Diagnosis not present

## 2016-08-13 DIAGNOSIS — I700 Atherosclerosis of aorta: Secondary | ICD-10-CM | POA: Insufficient documentation

## 2016-08-13 DIAGNOSIS — Z923 Personal history of irradiation: Secondary | ICD-10-CM | POA: Insufficient documentation

## 2016-08-14 ENCOUNTER — Ambulatory Visit: Payer: Medicare Other | Admitting: Radiation Oncology

## 2016-08-19 ENCOUNTER — Ambulatory Visit (INDEPENDENT_AMBULATORY_CARE_PROVIDER_SITE_OTHER): Payer: Medicare Other | Admitting: *Deleted

## 2016-08-19 DIAGNOSIS — E538 Deficiency of other specified B group vitamins: Secondary | ICD-10-CM | POA: Diagnosis not present

## 2016-08-19 NOTE — Progress Notes (Signed)
Pt given Vit B12 inj Tolerated well 

## 2016-08-20 ENCOUNTER — Ambulatory Visit: Payer: Medicare Other | Admitting: Family Medicine

## 2016-09-02 ENCOUNTER — Other Ambulatory Visit: Payer: Self-pay | Admitting: Cardiovascular Disease

## 2016-09-02 DIAGNOSIS — R0602 Shortness of breath: Secondary | ICD-10-CM

## 2016-09-02 DIAGNOSIS — I10 Essential (primary) hypertension: Secondary | ICD-10-CM

## 2016-09-02 DIAGNOSIS — I6523 Occlusion and stenosis of bilateral carotid arteries: Secondary | ICD-10-CM

## 2016-09-02 DIAGNOSIS — E785 Hyperlipidemia, unspecified: Secondary | ICD-10-CM

## 2016-09-08 ENCOUNTER — Ambulatory Visit (INDEPENDENT_AMBULATORY_CARE_PROVIDER_SITE_OTHER): Payer: Medicare Other | Admitting: Family Medicine

## 2016-09-08 ENCOUNTER — Encounter: Payer: Self-pay | Admitting: Family Medicine

## 2016-09-08 VITALS — BP 158/56 | HR 63 | Temp 96.8°F | Ht 65.0 in

## 2016-09-08 DIAGNOSIS — Z79891 Long term (current) use of opiate analgesic: Secondary | ICD-10-CM | POA: Diagnosis not present

## 2016-09-08 DIAGNOSIS — I6523 Occlusion and stenosis of bilateral carotid arteries: Secondary | ICD-10-CM | POA: Diagnosis not present

## 2016-09-08 DIAGNOSIS — R52 Pain, unspecified: Secondary | ICD-10-CM

## 2016-09-08 MED ORDER — ALPRAZOLAM 1 MG PO TABS: 1.0000 mg | ORAL_TABLET | Freq: Three times a day (TID) | ORAL | 2 refills | Status: DC | PRN

## 2016-09-08 MED ORDER — HYDROCODONE-ACETAMINOPHEN 10-325 MG PO TABS: 1.0000 | ORAL_TABLET | Freq: Four times a day (QID) | ORAL | 0 refills | Status: DC | PRN

## 2016-09-08 NOTE — Progress Notes (Signed)
Subjective:    Patient ID: [name], male    DOB: 12-25-1945, 70 y.o.   MRN: [id number]  HPI Patient here today for pain management contract and urine drug screen. He states that the majority of his pain is in his arms and legs. He is currently taking Hydrocodone 10 / 325- one tab every 6 hours as needed. This patient has a long medical history. He is had lung cancer. He is had a CABG. He has claudication secondary to peripheral vascular insufficiency. He has generally chronic pain syndrome and poor for neuropathy. He also has a history of thrombocytopenia. He also has ongoing anxiety. Wife comes with him to the visit today.    Patient Active Problem List   Diagnosis Date Noted  . Right carotid bruit 11/23/2014  . Claudication ([place]) 11/23/2014  . Thrombocytopenia ([place]) 07/17/2014  . Radiation fibrosis of lung ([place]) 07/17/2014  . Peripheral vascular insufficiency ([place]) 07/17/2014  . Peripheral neuropathy due to chemotherapy ([place]) 07/17/2014  . Allergic rhinitis due to pollen 07/17/2014  . Adenocarcinoma of the right upper lobe 03/09/2014  . BPH (benign prostatic hyperplasia) 03/09/2014  . Need for prophylactic vaccination and inoculation against influenza 09/24/2013  . Hyperlipemia 09/24/2013  . Ecchymosis 09/24/2013  . Mild eczema 09/24/2013  . Chronic pain syndrome 06/21/2013  . Anxiety state, unspecified 03/17/2013  . FEMORAL BRUIT 12/04/2010  . HYPERTENSION, BENIGN 12/13/2008  . CAD, ARTERY BYPASS GRAFT 12/13/2008  . 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 MYOCARDIAL INFARCTION 10/16/2008  . Swelling, mass, or lump in chest 10/16/2008   Outpatient Encounter Prescriptions as of 09/08/2016  Medication Sig  . acetaminophen (TYLENOL) 500 MG tablet Take 500-1,000 mg by mouth 2 (two) times daily as needed for moderate pain.  Marland Kitchen ALPRAZolam (XANAX) 1 MG tablet TAKE 1 TABLET THREE TIMES A DAY AS NEEDED  . amLODipine (NORVASC) 10 MG tablet Take 1 tablet (10 mg  total) by mouth daily.  Marland Kitchen aspirin EC 81 MG tablet Take 1 tablet (81 mg total) by mouth daily.  . cholecalciferol (VITAMIN D) 1000 units tablet Take 1,000 Units by mouth daily.  . fenofibrate 160 MG tablet TAKE 1 TABLET (160 MG TOTAL) BY MOUTH DAILY.  . fluticasone (FLONASE) 50 MCG/ACT nasal spray PLACE 2 SPRAYS INTO BOTH NOSTRILS DAILY.  Marland Kitchen gabapentin (NEURONTIN) 300 MG capsule TAKE ONE CAPSULE BY MOUTH EVERY 8 HOURS  . hydrochlorothiazide (MICROZIDE) 12.5 MG capsule Take 2 capsules (25 mg total) by mouth daily.  Marland Kitchen HYDROcodone-acetaminophen (NORCO) 10-325 MG tablet Take 1 tablet by mouth every 6 (six) hours as needed for moderate pain.  . isosorbide mononitrate (IMDUR) 60 MG 24 hr tablet Take 1 tablet (60 mg total) by mouth daily. PLEASE CONTACT OFFICE FOR ADDITIONAL REFILLS  . metoprolol (LOPRESSOR) 50 MG tablet TAKE 1 TABLET (50 MG TOTAL) BY MOUTH 2 (TWO) TIMES DAILY.  . Multiple Vitamin (MULTIVITAMIN) capsule Take 1 capsule by mouth daily.    . nitroGLYCERIN (NITROSTAT) 0.4 MG SL tablet Place 0.4 mg under the tongue every 5 (five) minutes as needed. After 3 doses and no relief call 911  . potassium chloride SA (KLOR-CON M20) 20 MEQ tablet Take 1 tablet (20 mEq total) by mouth daily.  . prasugrel (EFFIENT) 10 MG TABS tablet Take 10 mg by mouth daily. Reported on 10/25/2015  . prasugrel (EFFIENT) 10 MG TABS tablet TAKE 1 TABLET EVERY DAY  . rosuvastatin (CRESTOR) 40 MG tablet TAKE 1 TABLET BY MOUTH EVERY DAY  . [DISCONTINUED] mupirocin  ointment (BACTROBAN) 2 % Apply 1 application topically 2 (two) times daily.   Facility-Administered Encounter Medications as of 09/08/2016  Medication  . cyanocobalamin ((VITAMIN B-12)) injection 1,000 mcg      Review of Systems  Constitutional: Negative.   HENT: Negative.   Eyes: Negative.   Respiratory: Negative.   Cardiovascular: Negative.   Gastrointestinal: Negative.   Endocrine: Negative.   Genitourinary: Negative.   Musculoskeletal: Positive for  arthralgias (bilateral arms and legs).  Skin: Negative.   Allergic/Immunologic: Negative.   Neurological: Negative.   Hematological: Negative.   Psychiatric/Behavioral: Negative.        Objective:   Physical Exam  Constitutional: He is oriented to person, place, and time. He appears well-developed and well-nourished. No distress.  HENT:  Head: Normocephalic and atraumatic.  Mouth/Throat: No oropharyngeal exudate.  Eyes: Conjunctivae and EOM are normal. Pupils are equal, round, and reactive to light. Right eye exhibits no discharge. Left eye exhibits no discharge. No scleral icterus.  Neck: Normal range of motion.  Pulmonary/Chest: Effort normal.  Musculoskeletal: Normal range of motion. He exhibits no edema or tenderness.  Neurological: He is alert and oriented to person, place, and time.  Skin: Skin is warm and dry. No rash noted. No erythema.  Psychiatric: He has a normal mood and affect. His behavior is normal. Judgment and thought content normal.  Nursing note and vitals reviewed.  BP (!) 158/56 (BP Location: Left Arm)   Pulse 63   Temp (!) 96.8 F (36 C) (Oral)   Ht '5\' 5"'$  (1.651 m)   The patient comes in today to sign a pain contract for taking hydrocodone 10/325 4 times daily for chronic pain mostly in his arms and legs. It is felt that a lot of this pain may be due 2 previous chemotherapy. The reasoning behind the visit was explained to the patient and his wife and they both understood this.       Assessment & Plan:  1. Pain management -Prescriptions given for hydrocodone 10/325 one 4 times daily as needed for severe pain. - ToxASSURE Select 13 (MW), Urine  Meds ordered this encounter  Medications  . HYDROcodone-acetaminophen (NORCO) 10-325 MG tablet    Sig: Take 1 tablet by mouth every 6 (six) hours as needed.    Dispense:  120 tablet    Refill:  0    DO NOT FILL UNTIL 10/11/16.  Marland Kitchen HYDROcodone-acetaminophen (NORCO) 10-325 MG tablet    Sig: Take 1 tablet by mouth  every 6 (six) hours as needed.    Dispense:  120 tablet    Refill:  0    DO NOT FILL UNTIL 11/10/16.  Marland Kitchen HYDROcodone-acetaminophen (NORCO) 10-325 MG tablet    Sig: Take 1 tablet by mouth every 6 (six) hours as needed for moderate pain.    Dispense:  120 tablet    Refill:  0    DO NOT FILL UNTIL 09/11/16.   Patient Instructions  The patient may continue to take the pain medicine. He'll be encouraged to take as little as possible. He understands the reasoning for his coming in today and signing a pain contract in order to continue to take the medication. He also takes Xanax and we will encourage him to try to reduce this also.  Arrie Senate MD

## 2016-09-08 NOTE — Patient Instructions (Signed)
The patient may continue to take the pain medicine. He'll be encouraged to take as little as possible. He understands the reasoning for his coming in today and signing a pain contract in order to continue to take the medication. He also takes Xanax and we will encourage him to try to reduce this also.

## 2016-09-08 NOTE — Addendum Note (Signed)
Addended by: Zannie Cove on: 09/08/2016 02:25 PM   Modules accepted: Orders

## 2016-09-10 ENCOUNTER — Ambulatory Visit (HOSPITAL_COMMUNITY)
Admission: RE | Admit: 2016-09-10 | Discharge: 2016-09-10 | Disposition: A | Discharge status: Home / Self Care | Payer: Medicare Other | Source: Ambulatory Visit | Attending: Cardiovascular Disease | Admitting: Cardiovascular Disease

## 2016-09-10 DIAGNOSIS — I6523 Occlusion and stenosis of bilateral carotid arteries: Secondary | ICD-10-CM

## 2016-09-11 ENCOUNTER — Ambulatory Visit
Admission: RE | Admit: 2016-09-11 | Discharge: 2016-09-11 | Disposition: A | Discharge status: Home / Self Care | Payer: Medicare Other | Source: Ambulatory Visit | Attending: Radiation Oncology | Admitting: Radiation Oncology

## 2016-09-11 ENCOUNTER — Encounter: Payer: Self-pay | Admitting: Radiation Oncology

## 2016-09-11 VITALS — BP 141/61 | HR 64 | Temp 98.7°F | Resp 20 | Ht 65.5 in | Wt 155.4 lb

## 2016-09-11 DIAGNOSIS — Z886 Allergy status to analgesic agent status: Secondary | ICD-10-CM | POA: Insufficient documentation

## 2016-09-11 DIAGNOSIS — Z9221 Personal history of antineoplastic chemotherapy: Secondary | ICD-10-CM | POA: Diagnosis not present

## 2016-09-11 DIAGNOSIS — Z7982 Long term (current) use of aspirin: Secondary | ICD-10-CM | POA: Insufficient documentation

## 2016-09-11 DIAGNOSIS — Z885 Allergy status to narcotic agent status: Secondary | ICD-10-CM | POA: Insufficient documentation

## 2016-09-11 DIAGNOSIS — Z08 Encounter for follow-up examination after completed treatment for malignant neoplasm: Secondary | ICD-10-CM | POA: Diagnosis not present

## 2016-09-11 DIAGNOSIS — I252 Old myocardial infarction: Secondary | ICD-10-CM | POA: Insufficient documentation

## 2016-09-11 DIAGNOSIS — Z85118 Personal history of other malignant neoplasm of bronchus and lung: Secondary | ICD-10-CM

## 2016-09-11 DIAGNOSIS — Z880 Allergy status to penicillin: Secondary | ICD-10-CM | POA: Insufficient documentation

## 2016-09-11 DIAGNOSIS — Z888 Allergy status to other drugs, medicaments and biological substances status: Secondary | ICD-10-CM | POA: Diagnosis not present

## 2016-09-11 DIAGNOSIS — Z923 Personal history of irradiation: Secondary | ICD-10-CM | POA: Diagnosis not present

## 2016-09-11 DIAGNOSIS — C3411 Malignant neoplasm of upper lobe, right bronchus or lung: Secondary | ICD-10-CM | POA: Diagnosis not present

## 2016-09-11 NOTE — Progress Notes (Signed)
Follow up s/p lung radiation 6 years , here to get results CT Chest done on 08/13/2016  No c/o coughing,difiulty swallowing appetite good, energy level  Back and forth, difficultly breathing if too cold or too hot outside, generalized aches room air today 100% BP (!) 141/61 (BP Location: Left Arm, Patient Position: Sitting, Cuff Size: Normal)   Pulse 64   Temp 98.7 F (37.1 C) (Oral)   Resp 20   Ht 5' 5.5" (1.664 m)   Wt 155 lb 6.4 oz (70.5 kg)   SpO2 100% Comment: room air  BMI 25.47 kg/m   Wt Readings from Last 3 Encounters:  09/11/16 155 lb 6.4 oz (70.5 kg)  05/19/16 154 lb (69.9 kg)  01/02/16 155 lb (70.3 kg)

## 2016-09-11 NOTE — Progress Notes (Signed)
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 Patient notes waxing and waning energy, difficulty breathing in extreme hot or cold temperature, and generalized aches. Patient denies coughing, or difficulty swallowing.  Patient is concerned about ruining his teeth after receiving chemotherapy. He needs cardiac clearance before getting his teeth replaced given his history of heart attack.  Allergies:  Allergies  Allergen Reactions  . Penicillins Shortness Of Breath and Rash    Has patient had a PCN reaction causing immediate rash, facial/tongue/throat swelling, SOB or lightheadedness with hypotension: Yes Has patient had a PCN reaction causing severe rash involving mucus membranes or skin necrosis: No Has patient had a PCN reaction that required hospitalization No Has patient had a PCN reaction occurring within the last 10 years: No If all of the above answers are "NO", then may proceed with Cephalosporin use.   . Demerol Nausea And Vomiting  . Meperidine Hcl Other (See Comments)    unknown   . Propoxyphene N-Acetaminophen Nausea Only  . Clopidogrel Bisulfate Rash    Severe rash    Medications:  Current Outpatient Prescriptions  Medication Sig Dispense Refill  . acetaminophen (TYLENOL) 500 MG tablet Take 500-1,000 mg by mouth 2 (two) times daily as needed for moderate pain.    Marland Kitchen ALPRAZolam (XANAX) 1 MG tablet Take 1 tablet (1 mg total) by mouth 3 (three) times daily as needed. 90 tablet 2  . amLODipine (NORVASC) 10 MG tablet Take 1 tablet (10 mg total) by mouth daily. 30 tablet 2  .  aspirin EC 81 MG tablet Take 1 tablet (81 mg total) by mouth daily. 30 tablet 4  . cholecalciferol (VITAMIN D) 1000 units tablet Take 1,000 Units by mouth daily.    . fenofibrate 160 MG tablet TAKE 1 TABLET (160 MG TOTAL) BY MOUTH DAILY. 30 tablet 4  . fluticasone (FLONASE) 50 MCG/ACT nasal spray PLACE 2 SPRAYS INTO BOTH NOSTRILS DAILY. 16 g 3  . gabapentin (NEURONTIN) 300 MG capsule TAKE ONE CAPSULE BY MOUTH EVERY 8 HOURS 270 capsule 1  . hydrochlorothiazide (MICROZIDE) 12.5 MG capsule Take 2 capsules (25 mg total) by mouth daily. 60 capsule 1  . HYDROcodone-acetaminophen (NORCO) 10-325 MG tablet Take 1 tablet by mouth every 6 (six) hours as needed. 120 tablet 0  . HYDROcodone-acetaminophen (NORCO) 10-325 MG tablet Take 1 tablet by mouth every 6 (six) hours as needed. 120 tablet 0  . HYDROcodone-acetaminophen (NORCO) 10-325 MG tablet Take 1 tablet by mouth every 6 (six) hours as needed for moderate pain. 120 tablet 0  . isosorbide mononitrate (IMDUR) 60 MG 24 hr tablet Take 1 tablet (60 mg total) by mouth daily. PLEASE CONTACT OFFICE FOR ADDITIONAL REFILLS 30 tablet 2  . metoprolol (LOPRESSOR) 50 MG tablet TAKE 1 TABLET (50 MG TOTAL) BY MOUTH 2 (TWO) TIMES DAILY. 180 tablet 0  . Multiple Vitamin (MULTIVITAMIN) capsule Take 1 capsule by mouth daily.      . potassium chloride SA (KLOR-CON M20) 20 MEQ tablet Take 1 tablet (20 mEq total) by mouth daily. 30 tablet 11  . prasugrel (EFFIENT) 10 MG  TABS tablet Take 10 mg by mouth daily. Reported on 10/25/2015    . prasugrel (EFFIENT) 10 MG TABS tablet TAKE 1 TABLET EVERY DAY 30 tablet 2  . rosuvastatin (CRESTOR) 40 MG tablet TAKE 1 TABLET BY MOUTH EVERY DAY 90 tablet 1  . nitroGLYCERIN (NITROSTAT) 0.4 MG SL tablet Place 0.4 mg under the tongue every 5 (five) minutes as needed. After 3 doses and no relief call 911     Current Facility-Administered Medications  Medication Dose Route Frequency Provider Last Rate Last Dose  . cyanocobalamin ((VITAMIN B-12))  injection 1,000 mcg  1,000 mcg Intramuscular Q30 days Chipper Herb, MD   1,000 mcg at 08/19/16 0917    Physical Exam:  Vitals:   09/11/16 1100  BP: (!) 141/61  Pulse: 64  Resp: 20  Temp: 98.7 F (37.1 C)  Lungs are clear to auscultation bilaterally. Heart has regular rate and rhythm. No palpable cervical, supraclavicular, or axillary adenopathy. Abdomen soft, non-tender, normal bowel sounds.   IMPRESSION: [name] is a 70 y.o. male s/p RT alone for stage II Lung cancer. No evidence of recurrence on the clinical exam and recent CT scan.  PLAN: Patient will follow up in 1 year with a non-contrast CT of the chest. We are going to refer him to Dr. Enrique Sack for consideration of dentures and evaluation concerning this issue. Patient says chemotherapy caused him to lose his teeth.    This document serves as a record of services personally performed by Gery Pray, MD. It was created on his behalf by Bethann Humble, a trained medical scribe. The creation of this record is based on the scribe's personal observations and the provider's statements to them. This document has been checked and approved by the attending provider.

## 2016-09-12 LAB — TOXASSURE SELECT 13 (MW), URINE

## 2016-09-18 ENCOUNTER — Ambulatory Visit (INDEPENDENT_AMBULATORY_CARE_PROVIDER_SITE_OTHER): Payer: Medicare Other | Admitting: *Deleted

## 2016-09-18 DIAGNOSIS — E538 Deficiency of other specified B group vitamins: Secondary | ICD-10-CM | POA: Diagnosis not present

## 2016-09-18 NOTE — Progress Notes (Signed)
Pt given Vit B12 inj Tolerated well 

## 2016-09-22 ENCOUNTER — Ambulatory Visit: Payer: Medicare Other | Admitting: Family Medicine

## 2016-10-08 ENCOUNTER — Other Ambulatory Visit: Payer: Self-pay

## 2016-10-09 ENCOUNTER — Other Ambulatory Visit: Payer: Self-pay

## 2016-10-09 DIAGNOSIS — I10 Essential (primary) hypertension: Secondary | ICD-10-CM

## 2016-10-09 DIAGNOSIS — R0602 Shortness of breath: Secondary | ICD-10-CM

## 2016-10-09 MED ORDER — ISOSORBIDE MONONITRATE ER 60 MG PO TB24: 60.0000 mg | ORAL_TABLET | Freq: Every day | ORAL | 0 refills | Status: AC

## 2016-10-09 NOTE — Telephone Encounter (Signed)
Rx(s) sent to pharmacy electronically.  

## 2016-10-21 ENCOUNTER — Ambulatory Visit (INDEPENDENT_AMBULATORY_CARE_PROVIDER_SITE_OTHER): Payer: Medicare Other | Admitting: *Deleted

## 2016-10-21 DIAGNOSIS — E538 Deficiency of other specified B group vitamins: Secondary | ICD-10-CM | POA: Diagnosis not present

## 2016-10-21 NOTE — Progress Notes (Signed)
Pt given Vit B12 inj tolerated well

## 2016-10-27 ENCOUNTER — Other Ambulatory Visit: Payer: Self-pay | Admitting: Cardiovascular Disease

## 2016-10-28 ENCOUNTER — Other Ambulatory Visit: Payer: Self-pay | Admitting: Family Medicine

## 2016-11-03 ENCOUNTER — Encounter: Payer: Self-pay | Admitting: Cardiovascular Disease

## 2016-11-03 ENCOUNTER — Other Ambulatory Visit: Payer: Self-pay | Admitting: Family Medicine

## 2016-11-03 ENCOUNTER — Other Ambulatory Visit: Payer: Self-pay | Admitting: Cardiovascular Disease

## 2016-11-03 ENCOUNTER — Ambulatory Visit (INDEPENDENT_AMBULATORY_CARE_PROVIDER_SITE_OTHER): Payer: Medicare Other | Admitting: Cardiovascular Disease

## 2016-11-03 VITALS — BP 150/60 | HR 66 | Ht 65.0 in | Wt 156.1 lb

## 2016-11-03 DIAGNOSIS — I25119 Atherosclerotic heart disease of native coronary artery with unspecified angina pectoris: Secondary | ICD-10-CM

## 2016-11-03 DIAGNOSIS — R0602 Shortness of breath: Secondary | ICD-10-CM

## 2016-11-03 DIAGNOSIS — E785 Hyperlipidemia, unspecified: Secondary | ICD-10-CM

## 2016-11-03 DIAGNOSIS — I10 Essential (primary) hypertension: Secondary | ICD-10-CM

## 2016-11-03 DIAGNOSIS — I209 Angina pectoris, unspecified: Secondary | ICD-10-CM | POA: Diagnosis not present

## 2016-11-03 DIAGNOSIS — I25118 Atherosclerotic heart disease of native coronary artery with other forms of angina pectoris: Secondary | ICD-10-CM

## 2016-11-03 MED ORDER — NITROGLYCERIN 0.4 MG SL SUBL: 0.4000 mg | SUBLINGUAL_TABLET | SUBLINGUAL | 5 refills | Status: AC | PRN

## 2016-11-03 NOTE — Progress Notes (Signed)
Cardiology Office Note Date:  11/03/2016   ID:  [name], DOB [birth year], MRN [id number]  PCP:  Redge Gainer, MD  Cardiologist:  [name], MD    Chief Complaint  Patient presents with  . Coronary Artery Disease    follow up  . Chest Pain     History of Present Illness: [name] is a 71 y.o. male who presents for  follow-up evaluation. He has a history of coronary artery disease status post CABG, carotid stenosis status post right carotid endarterectomy, hypertension, and hyperlipidemia. The patient also has a history of lung cancer. He has chronic occlusion of the left Internal carotid artery followed by serial duplex studies.   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 He underwent PCI of a totally occluded vein graft to the obtuse marginal. He then underwent staged PCI of another saphenous vein graft a few days later.   He presented in 2015 with progressive anginal symptoms and underwent cardiac catheterization. This demonstrated continued patency of the LIMA to LAD and vein graft sequential to OM 3 and left posterolateral. The vein graft sequence to the OM1 and OM 2 branches was totally occluded. He was noted to have mild LV dysfunction with an ejection fraction of 45%. Medical therapy was recommended.  The patient is here with his wife today. He reports 2 recent 'attacks' of angina requiring sublingual NTG. Both times his chest pain was relieved with NTG, but the last episode was severe and required 3 SL NTG. He hasn't been exercising recently - blames this on the cold weather. Doesn't use his treadmill because he gets 'too hot.' Other complaints include shortness of breath and heart palpitations. He had limiting chest pain just walking to his mailbox recently. He had to stop and rest and symptoms resolved after several minutes. He describes his pain as a pressure-like sensation in the  center of the chest, nonradiating. Symptoms feel like his previous angina.   Past Medical History:  Diagnosis Date  . Anxiety    Dr. Corrinne Eagle    . ASCVD (arteriosclerotic cardiovascular disease)   . CAD (coronary artery disease)    Dr. Verl Blalock  - Cardiologist   . CHF (congestive heart failure) ([place])   . Dementia   . History of radiation therapy 11/15/08-02/23/09   RUL 66Gy/24f  . Hyperlipidemia   . Hypertension   . Lung cancer ([facility]    lung ca dx 1/10  . Neuropathic pain   . OA (osteoarthritis)   . [place]    carotid artery disease  . Radiation May 2010   Right upper lobe lung/Adenocarcinoma    Past Surgical History:  Procedure Laterality Date  . CARDIAC CATHETERIZATION  12/20/04   recurrent   . CAROTID ENDARTERECTOMY  12/98   right ([place])  . CORONARY ARTERY BYPASS GRAFT  7/95   x5 ([place] )  . LEFT HEART CATHETERIZATION WITH CORONARY ANGIOGRAM N/A 12/16/2013   Procedure: LEFT HEART CATHETERIZATION WITH CORONARY ANGIOGRAM;  Surgeon: MBlane Ohara MD;  [facility];  Service: Cardiovascular;  Laterality: N/A;  . LUNG CANCER SURGERY     had ratiation of right lung  . PERIPHERAL VASCULAR CATHETERIZATION Right 10/30/2015   Procedure: Carotid PTA/Stent Intervention;  Surgeon: JLorretta Harp MD;  [facility];  Service: Cardiovascular;  Laterality: Right;    Current Outpatient Prescriptions  Medication Sig Dispense Refill  .  acetaminophen (TYLENOL) 500 MG tablet Take 500-1,000 mg by mouth 2 (two) times daily as needed for moderate pain.    Marland Kitchen ALPRAZolam (XANAX) 1 MG tablet Take 1 tablet (1 mg total) by mouth 3 (three) times daily as needed. 90 tablet 2  . amLODipine (NORVASC) 10 MG tablet Take 1 tablet (10 mg total) by mouth daily. 30 tablet 2  . aspirin EC 81 MG tablet Take 1 tablet (81 mg total) by mouth daily. 30 tablet 4  . cholecalciferol (VITAMIN D) 1000 units tablet Take 1,000 Units by mouth daily.    .  fenofibrate 160 MG tablet TAKE 1 TABLET (160 MG TOTAL) BY MOUTH DAILY. 30 tablet 1  . fluticasone (FLONASE) 50 MCG/ACT nasal spray PLACE 2 SPRAYS INTO BOTH NOSTRILS DAILY. 16 g 3  . gabapentin (NEURONTIN) 300 MG capsule TAKE ONE CAPSULE BY MOUTH EVERY 8 HOURS 270 capsule 1  . hydrochlorothiazide (MICROZIDE) 12.5 MG capsule Take 2 capsules (25 mg total) by mouth daily. 60 capsule 1  . HYDROcodone-acetaminophen (NORCO) 10-325 MG tablet Take 1 tablet by mouth every 6 (six) hours as needed (pain).    . isosorbide mononitrate (IMDUR) 60 MG 24 hr tablet Take 1 tablet (60 mg total) by mouth daily. NEEDS APPOINTMENT FOR FUTURE REFILLS OR 90-DAY SUPPLY 30 tablet 0  . metoprolol (LOPRESSOR) 50 MG tablet Take 1 tablet (50 mg total) by mouth 2 (two) times daily. Please keep upcoming appointment for further refills 60 tablet 0  . Multiple Vitamin (MULTIVITAMIN) capsule Take 1 capsule by mouth daily.      . nitroGLYCERIN (NITROSTAT) 0.4 MG SL tablet Place 1 tablet (0.4 mg total) under the tongue every 5 (five) minutes as needed for chest pain. 25 tablet 5  . potassium chloride SA (KLOR-CON M20) 20 MEQ tablet Take 1 tablet (20 mEq total) by mouth daily. 30 tablet 11  . prasugrel (EFFIENT) 10 MG TABS tablet Take 10 mg by mouth daily. Reported on 10/25/2015    . rosuvastatin (CRESTOR) 40 MG tablet TAKE 1 TABLET BY MOUTH EVERY DAY 90 tablet 1   Current Facility-Administered Medications  Medication Dose Route Frequency Provider Last Rate Last Dose  . cyanocobalamin ((VITAMIN B-12)) injection 1,000 mcg  1,000 mcg Intramuscular Q30 days Chipper Herb, MD   1,000 mcg at 10/21/16 6213    Allergies:   Penicillins; Demerol; Meperidine hcl; Propoxyphene n-acetaminophen; and Clopidogrel bisulfate   Social History:  The patient  reports that he has quit smoking. He has quit using smokeless tobacco. He reports that he does not drink alcohol or use drugs.   Family History:  The patient's  family history includes Coronary  artery disease in his other; Heart attack in his mother; Heart disease in his brother, father, and sister.    ROS:  Please see the history of present illness.  Otherwise, review of systems is positive for Easy bruising.  All other systems are reviewed and negative.    PHYSICAL EXAM: VS:  BP (!) 150/60   Pulse 66   Ht '5\' 5"'$  (1.651 m)   Wt 156 lb 1.9 oz (70.8 kg)   BMI 25.98 kg/m  , BMI Body mass index is 25.98 kg/m. GEN: Well nourished, well developed, in no acute distress  HEENT: normal  Neck: no JVD, no masses. Bilateral carotid bruits Cardiac: RRR without murmur or gallop                Respiratory:  clear to auscultation bilaterally, normal work of breathing GI:  soft, nontender, nondistended, + BS MS: no deformity or atrophy  Ext: no pretibial edema, pedal pulses 2+= bilaterally Skin: warm and dry, no rash Neuro:  Strength and sensation are intact Psych: euthymic mood, full affect  EKG:  EKG is ordered today. The ekg ordered today shows NSR 66 bpm, ST and T wave changes consider inferior and anterolateral ischemia, unchanged from tracing 10-30-2015  Recent Labs: 05/19/2016: ALT 16; BUN 10; Creatinine, Ser 0.89; Platelets 122; Potassium 3.7; Sodium 142   Lipid Panel     Component Value Date/Time   CHOL 104 05/19/2016 1011   CHOL 127 03/17/2013 0928   TRIG 126 05/19/2016 1011   TRIG 148 03/17/2013 0928   HDL 32 (L) 05/19/2016 1011   HDL 34 (L) 03/17/2013 0928   CHOLHDL 3.3 11/16/2010 0400   VLDL 27 11/16/2010 0400   LDLCALC 72 07/17/2014 0943   LDLCALC 63 03/17/2013 0928      Wt Readings from Last 3 Encounters:  11/03/16 156 lb 1.9 oz (70.8 kg)  09/11/16 155 lb 6.4 oz (70.5 kg)  05/19/16 154 lb (69.9 kg)     ASSESSMENT AND PLAN: 1.  CAD, native vessel and bypass graft disease, with progressive angina CCS class III: The patient has significant angina on a good medical program that includes amlodipine, isosorbide, and metoprolol. Considering his known bypass graft  disease and progressive angina, I have recommended cardiac catheterization and possible PCI. I have reviewed the risks, indications, and alternatives to cardiac catheterization, possible angioplasty, and stenting with the patient. Risks include but are not limited to bleeding, infection, vascular injury, stroke, myocardial infection, arrhythmia, kidney injury, radiation-related injury in the case of prolonged fluoroscopy use, emergency cardiac surgery, and death. The patient understands the risks of serious complication is 1-2 in 7289 with diagnostic cardiac cath and 1-2% or less with angioplasty/stenting. Note the patient is treated with long-term dual antiplatelet therapy using aspirin and Effient. He is allergic to Plavix.  2. Hypertension: The patient will continue on amlodipine, isosorbide, hydrochlorothiazide, and metoprolol  3. Hyperlipidemia: The patient is treated with Crestor 40 mg daily.  4. Carotid artery stenosis without history of stroke: He has undergone carotid endarterectomy and carotid stenting for treatment of restenosis and his endarterectomy site. He has 60-79% stenosis on the right and chronic occlusion of the left carotid artery. Most recent carotid Doppler is reviewed today.  Current medicines are reviewed with the patient today.  The patient does not have concerns regarding medicines.  Labs/ tests ordered today include:   Orders Placed This Encounter  Procedures  . Basic Metabolic Panel (BMET)  . CBC  . INR/PT  . EKG 12-Lead    Disposition:   Cardiac Cath at next available time.   Deatra James, MD  11/03/2016 1:47 PM    [facility] [address], [place], [place]  [address] Phone: [phone]; Fax: [phone]

## 2016-11-03 NOTE — Patient Instructions (Signed)
Medication Instructions:  Your physician recommends that you continue on your current medications as directed. Please refer to the Current Medication list given to you today.  Labwork: Your physician recommends that you have lab work today: BMP, CBC and PT/INR  Testing/Procedures: Your physician has requested that you have a cardiac catheterization. Cardiac catheterization is used to diagnose and/or treat various heart conditions. Doctors may recommend this procedure for a number of different reasons. The most common reason is to evaluate chest pain. Chest pain can be a symptom of coronary artery disease (CAD), and cardiac catheterization can show whether plaque is narrowing or blocking your heart's arteries. This procedure is also used to evaluate the valves, as well as measure the blood flow and oxygen levels in different parts of your heart. For further information please visit [email or web address]. Please follow instruction sheet, as given.  Follow-Up: We will arrange further follow-up after your cardiac catheterization.   Any Other Special Instructions Will Be Listed Below (If Applicable).     If you need a refill on your cardiac medications before your next appointment, please call your pharmacy.

## 2016-11-04 ENCOUNTER — Ambulatory Visit (HOSPITAL_COMMUNITY)
Admission: RE | Admit: 2016-11-04 | Discharge: 2016-11-05 | Disposition: A | Discharge status: Home / Self Care | Payer: Medicare Other | Source: Ambulatory Visit | Attending: Cardiovascular Disease | Admitting: Cardiovascular Disease

## 2016-11-04 ENCOUNTER — Encounter (HOSPITAL_COMMUNITY)
Admission: RE | Disposition: A | Discharge status: Home / Self Care | Payer: Self-pay | Source: Ambulatory Visit | Attending: Cardiovascular Disease

## 2016-11-04 DIAGNOSIS — E785 Hyperlipidemia, unspecified: Secondary | ICD-10-CM | POA: Insufficient documentation

## 2016-11-04 DIAGNOSIS — Z955 Presence of coronary angioplasty implant and graft: Secondary | ICD-10-CM

## 2016-11-04 DIAGNOSIS — M1990 Unspecified osteoarthritis, unspecified site: Secondary | ICD-10-CM | POA: Diagnosis not present

## 2016-11-04 DIAGNOSIS — F0390 Unspecified dementia without behavioral disturbance: Secondary | ICD-10-CM | POA: Insufficient documentation

## 2016-11-04 DIAGNOSIS — I2584 Coronary atherosclerosis due to calcified coronary lesion: Secondary | ICD-10-CM | POA: Diagnosis not present

## 2016-11-04 DIAGNOSIS — I25718 Atherosclerosis of autologous vein coronary artery bypass graft(s) with other forms of angina pectoris: Secondary | ICD-10-CM

## 2016-11-04 DIAGNOSIS — Z8249 Family history of ischemic heart disease and other diseases of the circulatory system: Secondary | ICD-10-CM | POA: Insufficient documentation

## 2016-11-04 DIAGNOSIS — Z87891 Personal history of nicotine dependence: Secondary | ICD-10-CM | POA: Insufficient documentation

## 2016-11-04 DIAGNOSIS — Z7982 Long term (current) use of aspirin: Secondary | ICD-10-CM | POA: Insufficient documentation

## 2016-11-04 DIAGNOSIS — Z880 Allergy status to penicillin: Secondary | ICD-10-CM | POA: Diagnosis not present

## 2016-11-04 DIAGNOSIS — I6523 Occlusion and stenosis of bilateral carotid arteries: Secondary | ICD-10-CM | POA: Diagnosis not present

## 2016-11-04 DIAGNOSIS — Z7951 Long term (current) use of inhaled steroids: Secondary | ICD-10-CM | POA: Insufficient documentation

## 2016-11-04 DIAGNOSIS — I252 Old myocardial infarction: Secondary | ICD-10-CM | POA: Diagnosis not present

## 2016-11-04 DIAGNOSIS — F419 Anxiety disorder, unspecified: Secondary | ICD-10-CM | POA: Diagnosis not present

## 2016-11-04 DIAGNOSIS — I25710 Atherosclerosis of autologous vein coronary artery bypass graft(s) with unstable angina pectoris: Secondary | ICD-10-CM | POA: Diagnosis present

## 2016-11-04 DIAGNOSIS — Z923 Personal history of irradiation: Secondary | ICD-10-CM | POA: Insufficient documentation

## 2016-11-04 DIAGNOSIS — I2582 Chronic total occlusion of coronary artery: Secondary | ICD-10-CM | POA: Diagnosis not present

## 2016-11-04 DIAGNOSIS — I25118 Atherosclerotic heart disease of native coronary artery with other forms of angina pectoris: Secondary | ICD-10-CM

## 2016-11-04 DIAGNOSIS — I739 Peripheral vascular disease, unspecified: Secondary | ICD-10-CM | POA: Diagnosis not present

## 2016-11-04 DIAGNOSIS — I509 Heart failure, unspecified: Secondary | ICD-10-CM | POA: Insufficient documentation

## 2016-11-04 DIAGNOSIS — I110 Hypertensive heart disease with heart failure: Secondary | ICD-10-CM | POA: Diagnosis not present

## 2016-11-04 DIAGNOSIS — Z85118 Personal history of other malignant neoplasm of bronchus and lung: Secondary | ICD-10-CM | POA: Insufficient documentation

## 2016-11-04 HISTORY — PX: CARDIAC CATHETERIZATION: SHX172

## 2016-11-04 LAB — POCT ACTIVATED CLOTTING TIME: Activated Clotting Time: 296 seconds

## 2016-11-04 LAB — BASIC METABOLIC PANEL
BUN / CREAT RATIO: 10 (ref 10–24)
BUN: 9 mg/dL (ref 8–27)
CHLORIDE: 107 mmol/L — AB (ref 96–106)
CO2: 23 mmol/L (ref 18–29)
Calcium: 9.6 mg/dL (ref 8.6–10.2)
Creatinine, Ser: 0.88 mg/dL (ref 0.76–1.27)
GFR calc non Af Amer: 87 mL/min/{1.73_m2} (ref >59)
GFR, EST AFRICAN AMERICAN: 101 mL/min/{1.73_m2} (ref >59)
Glucose: 123 mg/dL — ABNORMAL HIGH (ref 65–99)
Potassium: 4.1 mmol/L (ref 3.5–5.2)
SODIUM: 146 mmol/L — AB (ref 134–144)

## 2016-11-04 LAB — CBC
HEMATOCRIT: 41.2 % (ref 37.5–51.0)
Hemoglobin: 13.7 g/dL (ref 13.0–17.7)
MCH: 29.6 pg (ref 26.6–33.0)
MCHC: 33.3 g/dL (ref 31.5–35.7)
MCV: 89 fL (ref 79–97)
Platelets: 259 10*3/uL (ref 150–379)
RBC: 4.63 x10E6/uL (ref 4.14–5.80)
RDW: 14.6 % (ref 12.3–15.4)
WBC: 8.4 10*3/uL (ref 3.4–10.8)

## 2016-11-04 LAB — PROTIME-INR
INR: 1 (ref 0.8–1.2)
PROTHROMBIN TIME: 10.7 s (ref 9.1–12.0)

## 2016-11-04 SURGERY — LEFT HEART CATH AND CORS/GRAFTS ANGIOGRAPHY

## 2016-11-04 MED ORDER — ACETAMINOPHEN 325 MG PO TABS: 650.0000 mg | ORAL_TABLET | ORAL | Status: DC | PRN

## 2016-11-04 MED ORDER — NITROGLYCERIN 0.3 MG SL SUBL
SUBLINGUAL_TABLET | SUBLINGUAL | Status: DC | PRN
Start: 2016-11-04 — End: 2016-11-04
  Administered 2016-11-04: 0.4 mg via SUBLINGUAL

## 2016-11-04 MED ORDER — SODIUM CHLORIDE 0.9 % WEIGHT BASED INFUSION
3.0000 mL/kg/h | INTRAVENOUS | Status: DC
  Administered 2016-11-04: 3 mL/kg/h via INTRAVENOUS

## 2016-11-04 MED ORDER — HYDROCHLOROTHIAZIDE 12.5 MG PO CAPS
12.5000 mg | ORAL_CAPSULE | Freq: Two times a day (BID) | ORAL | Status: DC
  Administered 2016-11-04 – 2016-11-05 (×2): 12.5 mg via ORAL
  Filled 2016-11-04 (×2): qty 1

## 2016-11-04 MED ORDER — SODIUM CHLORIDE 0.9 % WEIGHT BASED INFUSION
1.0000 mL/kg/h | INTRAVENOUS | Status: AC
  Administered 2016-11-04: 1 mL/kg/h via INTRAVENOUS

## 2016-11-04 MED ORDER — HYDRALAZINE HCL 20 MG/ML IJ SOLN
5.0000 mg | INTRAMUSCULAR | Status: AC | PRN
  Administered 2016-11-04: 16:00:00 5 mg via INTRAVENOUS
  Filled 2016-11-04: qty 1

## 2016-11-04 MED ORDER — FENTANYL CITRATE (PF) 100 MCG/2ML IJ SOLN
INTRAMUSCULAR | Status: AC
  Filled 2016-11-04: qty 2

## 2016-11-04 MED ORDER — ONDANSETRON HCL 4 MG/2ML IJ SOLN: 4.0000 mg | Freq: Four times a day (QID) | INTRAMUSCULAR | Status: DC | PRN

## 2016-11-04 MED ORDER — SODIUM CHLORIDE 0.9 % WEIGHT BASED INFUSION: 1.0000 mL/kg/h | INTRAVENOUS | Status: DC

## 2016-11-04 MED ORDER — ALPRAZOLAM 0.5 MG PO TABS
1.0000 mg | ORAL_TABLET | Freq: Three times a day (TID) | ORAL | Status: DC | PRN
  Administered 2016-11-04 – 2016-11-05 (×2): 1 mg via ORAL
  Filled 2016-11-04 (×2): qty 2

## 2016-11-04 MED ORDER — PRASUGREL HCL 10 MG PO TABS
10.0000 mg | ORAL_TABLET | Freq: Every day | ORAL | Status: DC
  Administered 2016-11-05: 10 mg via ORAL
  Filled 2016-11-04: qty 1

## 2016-11-04 MED ORDER — LIDOCAINE HCL (PF) 1 % IJ SOLN
INTRAMUSCULAR | Status: DC | PRN
Start: 2016-11-04 — End: 2016-11-04
  Administered 2016-11-04: 3 mL

## 2016-11-04 MED ORDER — VERAPAMIL HCL 2.5 MG/ML IV SOLN
INTRAVENOUS | Status: DC | PRN
  Administered 2016-11-04 (×2): 10 mL via INTRA_ARTERIAL

## 2016-11-04 MED ORDER — LIDOCAINE HCL (PF) 1 % IJ SOLN
INTRAMUSCULAR | Status: AC
  Filled 2016-11-04: qty 30

## 2016-11-04 MED ORDER — MIDAZOLAM HCL 2 MG/2ML IJ SOLN
INTRAMUSCULAR | Status: DC | PRN
  Administered 2016-11-04: 1 mg via INTRAVENOUS
  Administered 2016-11-04 (×2): 2 mg via INTRAVENOUS
  Administered 2016-11-04: 1 mg via INTRAVENOUS

## 2016-11-04 MED ORDER — MIDAZOLAM HCL 2 MG/2ML IJ SOLN
INTRAMUSCULAR | Status: AC
  Filled 2016-11-04: qty 2

## 2016-11-04 MED ORDER — LABETALOL HCL 5 MG/ML IV SOLN: 10.0000 mg | INTRAVENOUS | Status: AC | PRN

## 2016-11-04 MED ORDER — VERAPAMIL HCL 2.5 MG/ML IV SOLN
INTRAVENOUS | Status: AC
  Filled 2016-11-04: qty 2

## 2016-11-04 MED ORDER — ISOSORBIDE MONONITRATE ER 60 MG PO TB24
60.0000 mg | ORAL_TABLET | Freq: Every day | ORAL | Status: DC
  Administered 2016-11-05: 09:00:00 60 mg via ORAL
  Filled 2016-11-04: qty 1

## 2016-11-04 MED ORDER — LABETALOL HCL 5 MG/ML IV SOLN
INTRAVENOUS | Status: DC | PRN
  Administered 2016-11-04 (×2): 20 mg via INTRAVENOUS

## 2016-11-04 MED ORDER — SODIUM CHLORIDE 0.9% FLUSH: 3.0000 mL | INTRAVENOUS | Status: DC | PRN

## 2016-11-04 MED ORDER — METOPROLOL TARTRATE 25 MG PO TABS
50.0000 mg | ORAL_TABLET | Freq: Two times a day (BID) | ORAL | Status: DC
Start: 2016-11-04 — End: 2016-11-05
  Administered 2016-11-04 – 2016-11-05 (×2): 50 mg via ORAL
  Filled 2016-11-04 (×2): qty 2

## 2016-11-04 MED ORDER — FLUTICASONE PROPIONATE 50 MCG/ACT NA SUSP
2.0000 | Freq: Every day | NASAL | Status: DC
  Administered 2016-11-05: 2 via NASAL
  Filled 2016-11-04: qty 16

## 2016-11-04 MED ORDER — GABAPENTIN 300 MG PO CAPS
300.0000 mg | ORAL_CAPSULE | Freq: Three times a day (TID) | ORAL | Status: DC
  Administered 2016-11-04 – 2016-11-05 (×3): 300 mg via ORAL
  Filled 2016-11-04 (×3): qty 1

## 2016-11-04 MED ORDER — SODIUM CHLORIDE 0.9% FLUSH
3.0000 mL | Freq: Two times a day (BID) | INTRAVENOUS | Status: DC
  Administered 2016-11-04: 20:00:00 3 mL via INTRAVENOUS

## 2016-11-04 MED ORDER — ADENOSINE 12 MG/4ML IV SOLN
INTRAVENOUS | Status: AC
  Filled 2016-11-04: qty 4

## 2016-11-04 MED ORDER — LABETALOL HCL 5 MG/ML IV SOLN
INTRAVENOUS | Status: AC
Start: 2016-11-04 — End: ?
  Filled 2016-11-04: qty 4

## 2016-11-04 MED ORDER — SODIUM CHLORIDE 0.9 % IV SOLN: 250.0000 mL | INTRAVENOUS | Status: DC | PRN

## 2016-11-04 MED ORDER — HYDROCODONE-ACETAMINOPHEN 10-325 MG PO TABS
1.0000 | ORAL_TABLET | Freq: Four times a day (QID) | ORAL | Status: DC | PRN
  Administered 2016-11-04 – 2016-11-05 (×2): 1 via ORAL
  Filled 2016-11-04 (×2): qty 1

## 2016-11-04 MED ORDER — POTASSIUM CHLORIDE CRYS ER 20 MEQ PO TBCR
20.0000 meq | EXTENDED_RELEASE_TABLET | Freq: Every day | ORAL | Status: DC
  Administered 2016-11-05: 20 meq via ORAL
  Filled 2016-11-04: qty 1

## 2016-11-04 MED ORDER — ADENOSINE (DIAGNOSTIC) 140MCG/KG/MIN
INTRAVENOUS | Status: DC | PRN
  Administered 2016-11-04: 140 ug/kg/min via INTRAVENOUS

## 2016-11-04 MED ORDER — HEPARIN SODIUM (PORCINE) 1000 UNIT/ML IJ SOLN
INTRAMUSCULAR | Status: AC
  Filled 2016-11-04: qty 1

## 2016-11-04 MED ORDER — FENOFIBRATE 54 MG PO TABS
54.0000 mg | ORAL_TABLET | Freq: Every day | ORAL | Status: DC
  Administered 2016-11-05: 09:00:00 54 mg via ORAL
  Filled 2016-11-04: qty 1

## 2016-11-04 MED ORDER — VITAMIN D 1000 UNITS PO TABS
1000.0000 [IU] | ORAL_TABLET | Freq: Every day | ORAL | Status: DC
  Administered 2016-11-05: 1000 [IU] via ORAL
  Filled 2016-11-04: qty 1

## 2016-11-04 MED ORDER — ROSUVASTATIN CALCIUM 20 MG PO TABS
40.0000 mg | ORAL_TABLET | Freq: Every day | ORAL | Status: DC
  Administered 2016-11-05: 09:00:00 40 mg via ORAL
  Filled 2016-11-04: qty 2

## 2016-11-04 MED ORDER — LABETALOL HCL 5 MG/ML IV SOLN
INTRAVENOUS | Status: AC
  Filled 2016-11-04: qty 4

## 2016-11-04 MED ORDER — HEPARIN SODIUM (PORCINE) 1000 UNIT/ML IJ SOLN
INTRAMUSCULAR | Status: DC | PRN
  Administered 2016-11-04: 5000 [IU] via INTRAVENOUS
  Administered 2016-11-04: 3000 [IU] via INTRAVENOUS

## 2016-11-04 MED ORDER — AMLODIPINE BESYLATE 10 MG PO TABS
10.0000 mg | ORAL_TABLET | Freq: Every day | ORAL | Status: DC
  Administered 2016-11-05: 09:00:00 10 mg via ORAL
  Filled 2016-11-04: qty 1

## 2016-11-04 MED ORDER — SODIUM CHLORIDE 0.9% FLUSH: 3.0000 mL | Freq: Two times a day (BID) | INTRAVENOUS | Status: DC

## 2016-11-04 MED ORDER — HEPARIN (PORCINE) IN NACL 2-0.9 UNIT/ML-% IJ SOLN
INTRAMUSCULAR | Status: DC | PRN
  Administered 2016-11-04: 1000 mL

## 2016-11-04 MED ORDER — IOPAMIDOL (ISOVUE-370) INJECTION 76%
INTRAVENOUS | Status: DC | PRN
  Administered 2016-11-04: 100 mL via INTRA_ARTERIAL

## 2016-11-04 MED ORDER — SODIUM CHLORIDE 0.9 % IV SOLN
250.0000 mL | INTRAVENOUS | Status: DC | PRN
Start: 2016-11-04 — End: 2016-11-05

## 2016-11-04 MED ORDER — FENTANYL CITRATE (PF) 100 MCG/2ML IJ SOLN
INTRAMUSCULAR | Status: DC | PRN
  Administered 2016-11-04: 25 ug via INTRAVENOUS
  Administered 2016-11-04: 50 ug via INTRAVENOUS
  Administered 2016-11-04 (×2): 25 ug via INTRAVENOUS

## 2016-11-04 MED ORDER — ASPIRIN EC 81 MG PO TBEC
81.0000 mg | DELAYED_RELEASE_TABLET | Freq: Every day | ORAL | Status: DC
  Administered 2016-11-05: 81 mg via ORAL
  Filled 2016-11-04: qty 1

## 2016-11-04 MED ORDER — NITROGLYCERIN 1 MG/10 ML FOR IR/CATH LAB
INTRA_ARTERIAL | Status: DC | PRN
  Administered 2016-11-04: 200 ug via INTRACORONARY
  Administered 2016-11-04 (×2): 200 ug via INTRA_ARTERIAL

## 2016-11-04 MED ORDER — NITROGLYCERIN 0.4 MG SL SUBL
SUBLINGUAL_TABLET | SUBLINGUAL | Status: AC
  Filled 2016-11-04: qty 1

## 2016-11-04 MED ORDER — NITROGLYCERIN 1 MG/10 ML FOR IR/CATH LAB
INTRA_ARTERIAL | Status: AC
  Filled 2016-11-04: qty 10

## 2016-11-04 MED ORDER — IOPAMIDOL (ISOVUE-370) INJECTION 76%
INTRAVENOUS | Status: AC
  Filled 2016-11-04: qty 125

## 2016-11-04 MED ORDER — HEPARIN (PORCINE) IN NACL 2-0.9 UNIT/ML-% IJ SOLN
INTRAMUSCULAR | Status: AC
  Filled 2016-11-04: qty 500

## 2016-11-04 SURGICAL SUPPLY — 22 items
BALLN ~~LOC~~ EUPHORA RX 3.75X8 (BALLOONS) ×3
BALLOON ~~LOC~~ EUPHORA RX 3.75X8 (BALLOONS) IMPLANT
CATH EXPO 5FR AL2 (CATHETERS) ×2 IMPLANT
CATH EXPO 5FR ANG PIGTAIL 145 (CATHETERS) ×2 IMPLANT
CATH EXPO 5FR FR4 (CATHETERS) ×2 IMPLANT
CATH LAUNCHER 5F AL2 (CATHETERS) IMPLANT
CATH MICROCATH NAVVUS (MICROCATHETER) IMPLANT
CATHETER LAUNCHER 5F AL2 (CATHETERS) ×3
DEVICE RAD COMP TR BAND LRG (VASCULAR PRODUCTS) ×2 IMPLANT
GLIDESHEATH SLEND SS 6F .021 (SHEATH) ×2 IMPLANT
GUIDE CATH RUNWAY 6FR AL 1 (CATHETERS) ×2 IMPLANT
GUIDEWIRE INQWIRE 1.5J.035X260 (WIRE) IMPLANT
INQWIRE 1.5J .035X260CM (WIRE) ×3
KIT ENCORE 26 ADVANTAGE (KITS) ×2 IMPLANT
KIT HEART LEFT (KITS) ×3 IMPLANT
MICROCATHETER NAVVUS (MICROCATHETER) ×3
PACK CARDIAC CATHETERIZATION (CUSTOM PROCEDURE TRAY) ×3 IMPLANT
STENT PROMUS PREM MR 3.5X16 (Permanent Stent) ×2 IMPLANT
SYR MEDRAD MARK V 150ML (SYRINGE) ×3 IMPLANT
TRANSDUCER W/STOPCOCK (MISCELLANEOUS) ×3 IMPLANT
TUBING CIL FLEX 10 FLL-RA (TUBING) ×3 IMPLANT
WIRE COUGAR XT STRL 190CM (WIRE) ×2 IMPLANT

## 2016-11-04 NOTE — Progress Notes (Signed)
TR BAND REMOVAL  LOCATION:  left radial  DEFLATED PER PROTOCOL:  Yes.    TIME BAND OFF / DRESSING APPLIED:   1915   SITE UPON ARRIVAL:   Level 0  SITE AFTER BAND REMOVAL:  Level 0  CIRCULATION SENSATION AND MOVEMENT:  Within Normal Limits  Yes.    COMMENTS:

## 2016-11-04 NOTE — Care Management Note (Signed)
Case Management Note  Patient Details  Name: [name] MRN: [id number] Date of Birth: August 05, 1946  Subjective/Objective:   S/p coronary stent intervention, he has effient as a home medication, NCM will cont to follow for dc needs.                  Action/Plan:   Expected Discharge Date:                  Expected Discharge Plan:  Home/Self Care  In-House Referral:     Discharge planning Services  CM Consult  Post Acute Care Choice:    Choice offered to:     DME Arranged:    DME Agency:     HH Arranged:    HH Agency:     Status of Service:  In process, will continue to follow  If discussed at Long Length of Stay Meetings, dates discussed:    Additional Comments:  Zenon Mayo, RN 11/04/2016, 3:45 PM

## 2016-11-04 NOTE — Interval H&P Note (Signed)
Cath Lab Visit (complete for each Cath Lab visit)  Clinical Evaluation Leading to the Procedure:   ACS: No.  Non-ACS:    Anginal Classification: CCS III  Anti-ischemic medical therapy: Maximal Therapy (2 or more classes of medications)  Non-Invasive Test Results: No non-invasive testing performed  Prior CABG: Previous CABG      History and Physical Interval Note:  11/04/2016 1:33 PM  [name]  has presented today for surgery, with the diagnosis of cad - angina  The various methods of treatment have been discussed with the patient and family. After consideration of risks, benefits and other options for treatment, the patient has consented to  Procedure(s): Left Heart Cath and Cors/Grafts Angiography (N/A) as a surgical intervention .  The patient's history has been reviewed, patient examined, no change in status, stable for surgery.  I have reviewed the patient's chart and labs.  Questions were answered to the patient's satisfaction.     [name]

## 2016-11-04 NOTE — H&P (View-Only) (Signed)
Cardiology Office Note Date:  11/03/2016   ID:  [name], DOB September 27, 1946, MRN [id number]  PCP:  Redge Gainer, MD  Cardiologist:  [name], MD    Chief Complaint  Patient presents with  . Coronary Artery Disease    follow up  . Chest Pain     History of Present Illness: [name] is a 71 y.o. male who presents for  follow-up evaluation. He has a history of coronary artery disease status post CABG, carotid stenosis status post right carotid endarterectomy, hypertension, and hyperlipidemia. The patient also has a history of lung cancer. He has chronic occlusion of the left Internal carotid artery followed by serial duplex studies.   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 He underwent PCI of a totally occluded vein graft to the obtuse marginal. He then underwent staged PCI of another saphenous vein graft a few days later.   He presented in 2015 with progressive anginal symptoms and underwent cardiac catheterization. This demonstrated continued patency of the LIMA to LAD and vein graft sequential to OM 3 and left posterolateral. The vein graft sequence to the OM1 and OM 2 branches was totally occluded. He was noted to have mild LV dysfunction with an ejection fraction of 45%. Medical therapy was recommended.  The patient is here with his wife today. He reports 2 recent 'attacks' of angina requiring sublingual NTG. Both times his chest pain was relieved with NTG, but the last episode was severe and required 3 SL NTG. He hasn't been exercising recently - blames this on the cold weather. Doesn't use his treadmill because he gets 'too hot.' Other complaints include shortness of breath and heart palpitations. He had limiting chest pain just walking to his mailbox recently. He had to stop and rest and symptoms resolved after several minutes. He describes his pain as a pressure-like sensation in the  center of the chest, nonradiating. Symptoms feel like his previous angina.   Past Medical History:  Diagnosis Date  . Anxiety    Dr. Corrinne Eagle    . ASCVD (arteriosclerotic cardiovascular disease)   . CAD (coronary artery disease)    Dr. Verl Blalock  - Cardiologist   . CHF (congestive heart failure) ([place])   . Dementia   . History of radiation therapy 11/15/08-02/23/09   RUL 66Gy/59f  . Hyperlipidemia   . Hypertension   [place]    lung ca dx 1/10  . Neuropathic pain   . OA (osteoarthritis)   . PVD [place]ipheral vascular disease) [place]    carotid artery disease  . Radiation May 2010   Right upper lobe lung/Adenocarcinoma    Past Surgical History:  Procedure Laterality Date  . CARDIAC CATHETERIZATION  12/20/04   recurrent   . CAROTID ENDARTERECTOMY  12/98   right ([place])  . CORONARY ARTERY BYPASS GRAFT  7/95   x5 ([place] )  . LEFT HEART CATHETERIZATION WITH CORONARY ANGIOGRAM N/A 12/16/2013   Procedure: LEFT HEART CATHETERIZATION WITH CORONARY ANGIOGRAM;  Surgeon: MBlane Ohara MD;  [facility];  Service: Cardiovascular;  Laterality: N/A;  . LUNG CANCER SURGERY     had ratiation of right lung  . PERIPHERAL VASCULAR CATHETERIZATION Right 10/30/2015   Procedure: Carotid PTA/Stent Intervention;  Surgeon: JLorretta Harp MD;  [facility];  Service: Cardiovascular;  Laterality: Right;    Current Outpatient Prescriptions  Medication Sig Dispense Refill  .  acetaminophen (TYLENOL) 500 MG tablet Take 500-1,000 mg by mouth 2 (two) times daily as needed for moderate pain.    Marland Kitchen ALPRAZolam (XANAX) 1 MG tablet Take 1 tablet (1 mg total) by mouth 3 (three) times daily as needed. 90 tablet 2  . amLODipine (NORVASC) 10 MG tablet Take 1 tablet (10 mg total) by mouth daily. 30 tablet 2  . aspirin EC 81 MG tablet Take 1 tablet (81 mg total) by mouth daily. 30 tablet 4  . cholecalciferol (VITAMIN D) 1000 units tablet Take 1,000 Units by mouth daily.    .  fenofibrate 160 MG tablet TAKE 1 TABLET (160 MG TOTAL) BY MOUTH DAILY. 30 tablet 1  . fluticasone (FLONASE) 50 MCG/ACT nasal spray PLACE 2 SPRAYS INTO BOTH NOSTRILS DAILY. 16 g 3  . gabapentin (NEURONTIN) 300 MG capsule TAKE ONE CAPSULE BY MOUTH EVERY 8 HOURS 270 capsule 1  . hydrochlorothiazide (MICROZIDE) 12.5 MG capsule Take 2 capsules (25 mg total) by mouth daily. 60 capsule 1  . HYDROcodone-acetaminophen (NORCO) 10-325 MG tablet Take 1 tablet by mouth every 6 (six) hours as needed (pain).    . isosorbide mononitrate (IMDUR) 60 MG 24 hr tablet Take 1 tablet (60 mg total) by mouth daily. NEEDS APPOINTMENT FOR FUTURE REFILLS OR 90-DAY SUPPLY 30 tablet 0  . metoprolol (LOPRESSOR) 50 MG tablet Take 1 tablet (50 mg total) by mouth 2 (two) times daily. Please keep upcoming appointment for further refills 60 tablet 0  . Multiple Vitamin (MULTIVITAMIN) capsule Take 1 capsule by mouth daily.      . nitroGLYCERIN (NITROSTAT) 0.4 MG SL tablet Place 1 tablet (0.4 mg total) under the tongue every 5 (five) minutes as needed for chest pain. 25 tablet 5  . potassium chloride SA (KLOR-CON M20) 20 MEQ tablet Take 1 tablet (20 mEq total) by mouth daily. 30 tablet 11  . prasugrel (EFFIENT) 10 MG TABS tablet Take 10 mg by mouth daily. Reported on 10/25/2015    . rosuvastatin (CRESTOR) 40 MG tablet TAKE 1 TABLET BY MOUTH EVERY DAY 90 tablet 1   Current Facility-Administered Medications  Medication Dose Route Frequency Provider Last Rate Last Dose  . cyanocobalamin ((VITAMIN B-12)) injection 1,000 mcg  1,000 mcg Intramuscular Q30 days Chipper Herb, MD   1,000 mcg at 10/21/16 8676    Allergies:   Penicillins; Demerol; Meperidine hcl; Propoxyphene n-acetaminophen; and Clopidogrel bisulfate   Social History:  The patient  reports that he has quit smoking. He has quit using smokeless tobacco. He reports that he does not drink alcohol or use drugs.   Family History:  The patient's  family history includes Coronary  artery disease in his other; Heart attack in his mother; Heart disease in his brother, father, and sister.    ROS:  Please see the history of present illness.  Otherwise, review of systems is positive for Easy bruising.  All other systems are reviewed and negative.    PHYSICAL EXAM: VS:  BP (!) 150/60   Pulse 66   Ht '5\' 5"'$  (1.651 m)   Wt 156 lb 1.9 oz (70.8 kg)   BMI 25.98 kg/m  , BMI Body mass index is 25.98 kg/m. GEN: Well nourished, well developed, in no acute distress  HEENT: normal  Neck: no JVD, no masses. Bilateral carotid bruits Cardiac: RRR without murmur or gallop                Respiratory:  clear to auscultation bilaterally, normal work of breathing GI:  soft, nontender, nondistended, + BS MS: no deformity or atrophy  Ext: no pretibial edema, pedal pulses 2+= bilaterally Skin: warm and dry, no rash Neuro:  Strength and sensation are intact Psych: euthymic mood, full affect  EKG:  EKG is ordered today. The ekg ordered today shows NSR 66 bpm, ST and T wave changes consider inferior and anterolateral ischemia, unchanged from tracing 10-30-2015  Recent Labs: 05/19/2016: ALT 16; BUN 10; Creatinine, Ser 0.89; Platelets 122; Potassium 3.7; Sodium 142   Lipid Panel     Component Value Date/Time   CHOL 104 05/19/2016 1011   CHOL 127 03/17/2013 0928   TRIG 126 05/19/2016 1011   TRIG 148 03/17/2013 0928   HDL 32 (L) 05/19/2016 1011   HDL 34 (L) 03/17/2013 0928   CHOLHDL 3.3 11/16/2010 0400   VLDL 27 11/16/2010 0400   LDLCALC 72 07/17/2014 0943   LDLCALC 63 03/17/2013 0928      Wt Readings from Last 3 Encounters:  11/03/16 156 lb 1.9 oz (70.8 kg)  09/11/16 155 lb 6.4 oz (70.5 kg)  05/19/16 154 lb (69.9 kg)     ASSESSMENT AND PLAN: 1.  CAD, native vessel and bypass graft disease, with progressive angina CCS class III: The patient has significant angina on a good medical program that includes amlodipine, isosorbide, and metoprolol. Considering his known bypass graft  disease and progressive angina, I have recommended cardiac catheterization and possible PCI. I have reviewed the risks, indications, and alternatives to cardiac catheterization, possible angioplasty, and stenting with the patient. Risks include but are not limited to bleeding, infection, vascular injury, stroke, myocardial infection, arrhythmia, kidney injury, radiation-related injury in the case of prolonged fluoroscopy use, emergency cardiac surgery, and death. The patient understands the risks of serious complication is 1-2 in 5449 with diagnostic cardiac cath and 1-2% or less with angioplasty/stenting. Note the patient is treated with long-term dual antiplatelet therapy using aspirin and Effient. He is allergic to Plavix.  2. Hypertension: The patient will continue on amlodipine, isosorbide, hydrochlorothiazide, and metoprolol  3. Hyperlipidemia: The patient is treated with Crestor 40 mg daily.  4. Carotid artery stenosis without history of stroke: He has undergone carotid endarterectomy and carotid stenting for treatment of restenosis and his endarterectomy site. He has 60-79% stenosis on the right and chronic occlusion of the left carotid artery. Most recent carotid Doppler is reviewed today.  Current medicines are reviewed with the patient today.  The patient does not have concerns regarding medicines.  Labs/ tests ordered today include:   Orders Placed This Encounter  Procedures  . Basic Metabolic Panel (BMET)  . CBC  . INR/PT  . EKG 12-Lead    Disposition:   Cardiac Cath at next available time.   Deatra James, MD  11/03/2016 1:47 PM    [facility] [address], [place], [place]  [address] Phone: [phone]; Fax: [phone]

## 2016-11-05 ENCOUNTER — Encounter (HOSPITAL_COMMUNITY): Payer: Self-pay | Admitting: Cardiovascular Disease

## 2016-11-05 ENCOUNTER — Other Ambulatory Visit: Payer: Self-pay | Admitting: Cardiovascular Disease

## 2016-11-05 DIAGNOSIS — I200 Unstable angina: Secondary | ICD-10-CM

## 2016-11-05 DIAGNOSIS — I25118 Atherosclerotic heart disease of native coronary artery with other forms of angina pectoris: Secondary | ICD-10-CM | POA: Diagnosis not present

## 2016-11-05 DIAGNOSIS — I25710 Atherosclerosis of autologous vein coronary artery bypass graft(s) with unstable angina pectoris: Secondary | ICD-10-CM | POA: Diagnosis not present

## 2016-11-05 DIAGNOSIS — Z951 Presence of aortocoronary bypass graft: Secondary | ICD-10-CM | POA: Diagnosis not present

## 2016-11-05 DIAGNOSIS — I10 Essential (primary) hypertension: Secondary | ICD-10-CM

## 2016-11-05 DIAGNOSIS — I6523 Occlusion and stenosis of bilateral carotid arteries: Secondary | ICD-10-CM | POA: Diagnosis not present

## 2016-11-05 DIAGNOSIS — I509 Heart failure, unspecified: Secondary | ICD-10-CM | POA: Diagnosis not present

## 2016-11-05 DIAGNOSIS — I2584 Coronary atherosclerosis due to calcified coronary lesion: Secondary | ICD-10-CM | POA: Diagnosis not present

## 2016-11-05 DIAGNOSIS — E782 Mixed hyperlipidemia: Secondary | ICD-10-CM

## 2016-11-05 DIAGNOSIS — I252 Old myocardial infarction: Secondary | ICD-10-CM | POA: Diagnosis not present

## 2016-11-05 DIAGNOSIS — I2582 Chronic total occlusion of coronary artery: Secondary | ICD-10-CM | POA: Diagnosis not present

## 2016-11-05 DIAGNOSIS — I110 Hypertensive heart disease with heart failure: Secondary | ICD-10-CM | POA: Diagnosis not present

## 2016-11-05 DIAGNOSIS — E785 Hyperlipidemia, unspecified: Secondary | ICD-10-CM | POA: Diagnosis not present

## 2016-11-05 LAB — CBC
HCT: 36.9 % — ABNORMAL LOW (ref 39.0–52.0)
Hemoglobin: 12.2 g/dL — ABNORMAL LOW (ref 13.0–17.0)
MCH: 28.9 pg (ref 26.0–34.0)
MCHC: 33.1 g/dL (ref 30.0–36.0)
MCV: 87.4 fL (ref 78.0–100.0)
PLATELETS: 193 10*3/uL (ref 150–400)
RBC: 4.22 MIL/uL (ref 4.22–5.81)
RDW: 14.6 % (ref 11.5–15.5)
WBC: 5.8 10*3/uL (ref 4.0–10.5)

## 2016-11-05 LAB — BASIC METABOLIC PANEL
Anion gap: 9 (ref 5–15)
BUN: 9 mg/dL (ref 6–20)
CALCIUM: 8.8 mg/dL — AB (ref 8.9–10.3)
CO2: 24 mmol/L (ref 22–32)
CREATININE: 0.77 mg/dL (ref 0.61–1.24)
Chloride: 106 mmol/L (ref 101–111)
Glucose, Bld: 105 mg/dL — ABNORMAL HIGH (ref 65–99)
Potassium: 3.2 mmol/L — ABNORMAL LOW (ref 3.5–5.1)
SODIUM: 139 mmol/L (ref 135–145)

## 2016-11-05 MED ORDER — ANGIOPLASTY BOOK
Freq: Once | Status: DC
  Filled 2016-11-05: qty 1

## 2016-11-05 MED FILL — Nitroglycerin SL Tab 0.4 MG: SUBLINGUAL | Qty: 1 | Status: AC

## 2016-11-05 NOTE — Progress Notes (Addendum)
CARDIAC REHAB PHASE I   PRE:  Rate/Rhythm: 84 SR  BP:  Supine:   Sitting: 142/46  Standing:    SaO2:   MODE:  Ambulation: 800 ft   POST:  Rate/Rhythm: 93 SR paired PVCs, scattered PVCs  BP:  Supine:   Sitting: 218/72 dinamapp, 200/80 manual       After sitting for ed 128/60  Standing:    SaO2: 95%RA 0805-0902 Pt walked 800 ft with steady gait. Stopped three times to rest with some DOE. BP elevated after walk. Pt deconditioned. Stated has not been able to walk much due to CP. Discussed importance of building his walking up to get stronger. Discussed using treadmill or stationary bike in a week to give wrist time to heal. Reviewed importance of effient with stent, NTG use, heart healthy diet and ex ed. Discussed CRP 2 and will refer to [place]. Think it would be really good for pt to monitor HR and BP. Doubt pt will go as he stated he has that equipment at home. Cardiologist aware of BP. Pt stated if he had had his BP meds already his BP would be much better with activity.   Graylon Good, RN BSN  11/05/2016 8:57 AM

## 2016-11-05 NOTE — Progress Notes (Addendum)
Progress Note  Patient Name: [name] Date of Encounter: 11/05/2016  Primary Cardiologist: Dr. Burt Knack  Subjective   Feeling well. No chest pain, sob or palpitations.   Inpatient Medications    Scheduled Meds: . amLODipine  10 mg Oral Daily  . angioplasty book   Does not apply Once  . aspirin EC  81 mg Oral Daily  . cholecalciferol  1,000 Units Oral Daily  . fenofibrate  54 mg Oral Daily  . fluticasone  2 spray Each Nare Daily  . gabapentin  300 mg Oral TID  . hydrochlorothiazide  12.5 mg Oral BID  . isosorbide mononitrate  60 mg Oral Daily  . metoprolol  50 mg Oral BID  . potassium chloride SA  20 mEq Oral Daily  . prasugrel  10 mg Oral Daily  . rosuvastatin  40 mg Oral Daily  . sodium chloride flush  3 mL Intravenous Q12H   Continuous Infusions:  PRN Meds: sodium chloride, acetaminophen, ALPRAZolam, HYDROcodone-acetaminophen, ondansetron (ZOFRAN) IV, sodium chloride flush   Vital Signs    Vitals:   11/04/16 2115 11/05/16 0000 11/05/16 0345 11/05/16 0748  BP: (!) 140/48  (!) 129/48 (!) 142/46  Pulse: 69 69 73 77  Resp: '19 20 14 15  '$ Temp:  98.1 F (36.7 C) 97.9 F (36.6 C) 97.8 F (36.6 C)  TempSrc:  Oral Oral Oral  SpO2: 97% 96% 98% 99%  Weight:   156 lb (70.8 kg)   Height:        Intake/Output Summary (Last 24 hours) at 11/05/16 0801 Last data filed at 11/05/16 0756  Gross per 24 hour  Intake             1659 ml  Output             1950 ml  Net             -291 ml   Filed Weights   11/04/16 1131 11/05/16 0345  Weight: 156 lb (70.8 kg) 156 lb (70.8 kg)    Telemetry    NSR with PVCS - Personally Reviewed  ECG    NSR with inferior lateral TWI - Personally Reviewed  Physical Exam   GEN: No acute distress.   Neck: No JVD Cardiac: RRR, no murmurs, rubs, or gallops. L radial small hematoma without bruit.  Respiratory: Clear to auscultation bilaterally. GI: Soft, nontender, non-distended  MS: No edema; No deformity. Neuro:  Nonfocal    Psych: Normal affect   Labs    Chemistry Recent Labs Lab 11/03/16 1045 11/05/16 0200  NA 146* 139  K 4.1 3.2*  CL 107* 106  CO2 23 24  GLUCOSE 123* 105*  BUN 9 9  CREATININE 0.88 0.77  CALCIUM 9.6 8.8*  GFRNONAA 87 >60  GFRAA 101 >60  ANIONGAP  --  9     Hematology Recent Labs Lab 11/03/16 1045 11/05/16 0200  WBC 8.4 5.8  RBC 4.63 4.22  HGB  --  12.2*  HCT 41.2 36.9*  MCV 89 87.4  MCH 29.6 28.9  MCHC 33.3 33.1  RDW 14.6 14.6  PLT 259 193    Cardiac EnzymesNo results for input(s): TROPONINI in the last 168 hours. No results for input(s): TROPIPOC in the last 168 hours.   BNPNo results for input(s): BNP, PROBNP in the last 168 hours.   DDimer No results for input(s): DDIMER in the last 168 hours.   Radiology    No results found.  Cardiac Studies  Coronary Stent Intervention  Intravascular Pressure Wire/FFR Study-seq SVG-OM3, PLA  Left Heart Cath and Cors/Grafts Angiography  Conclusion   1. Severe native vessel CAD with patency of a small nondominant RCA and chronic occlusion of the left coronary artery.  2. Mild contraction abnormality of the LV with preserved LVEF 3. S/P CABG with continued patency of the LIMA-LAD, chronic occlusion of the SVG-OM1 and OM2, and patency of the SVG-OM3/Left PDA.  4. Severe stenosis of the SVG-PDA/OM3 documented by FFR, treated successfully with a DES (3.5x16 mm Promus DES)  Continue home medical therapy. Anticipate DC home tomorrow.      Patient Profile     71 y.o. male CAD s/p CABG, carotid artery disease s/p R CEA,  HLD, HTN presents for schedule cath.   Assessment & Plan    1. CAD s/p CABG - detailed cath as above. S/p DES tp SVG-PDA/OMs. Continue ASA, Effient and statin. Mild hematoma at cath site without bruit. Ambulate with cardiac rehab. Discharge later today.   2. HTN - Stable and well controlled.   Signed, Leanor Kail, PA  11/05/2016, 8:01 AM    The patient was seen, examined and discussed  with Bhagat,Bhavinkumar PA-C and I agree with the above.   71 y.o. male CAD s/p CABG, carotid artery disease s/p R CEA,  HLD, HTN post cath yesterday for unstable angina that showed  severe native vessel CAD with patency of a small nondominant RCA and chronic occlusion of the left coronary artery. Mild contraction abnormality of the LV with preserved LVEF. S/P CABG with continued patency of the LIMA-LAD, chronic occlusion of the SVG-OM1 and OM2, and patency of the SVG-OM3/Left PDA.  Severe stenosis of the SVG-PDA/OM3 documented by FFR, treated successfully with a DES (3.5x16 mm Promus DES)  Continue home medical therapy. BP elevated this am after walking with PT. Asymptomatic. He hasn't received his morning meds yet. Follow up with Dr Burt Knack within 7-10 days.   Discharge home today.   Ena Dawley, MD 11/05/2016

## 2016-11-05 NOTE — Discharge Summary (Signed)
Discharge Summary    Patient ID: [name],  MRN: [id number], DOB/AGE: Dec 11, 1945 71 y.o.  Admit date: 11/04/2016 Discharge date: 11/05/2016  Primary Care Provider: Redge Gainer Primary Cardiologist: Dr. Burt Knack  Discharge Diagnoses    Active Problems:   Coronary artery disease with exertional angina [facility])   Carotid artery disease s/p R CEA   HLD   HTN  Allergies Allergies  Allergen Reactions  . Penicillins Shortness Of Breath and Rash    Has patient had a PCN reaction causing immediate rash, facial/tongue/throat swelling, SOB or lightheadedness with hypotension: Yes Has patient had a PCN reaction causing severe rash involving mucus membranes or skin necrosis: No Has patient had a PCN reaction that required hospitalization No Has patient had a PCN reaction occurring within the last 10 years: No If all of the above answers are "NO", then may proceed with Cephalosporin use.   Marland Kitchen Clopidogrel Bisulfate Rash    Severe rash  . Demerol Nausea And Vomiting  . Propoxyphene N-Acetaminophen Nausea Only    Diagnostic Studies/Procedures    Coronary Stent Intervention  Intravascular Pressure Wire/FFR Study-seq SVG-OM3, PLA  Left Heart Cath and Cors/Grafts Angiography  Conclusion   1. Severe native vessel CAD with patency of a small nondominant RCA and chronic occlusion of the left coronary artery.  2. Mild contraction abnormality of the LV with preserved LVEF 3. S/P CABG with continued patency of the LIMA-LAD, chronic occlusion of the SVG-OM1 and OM2, and patency of the SVG-OM3/Left PDA.  4. Severe stenosis of the SVG-PDA/OM3 documented by FFR, treated successfully with a DES (3.5x16 mm Promus DES)  Continue home medical therapy. Anticipate DC home tomorrow.       History of Present Illness     71 y.o. male CAD s/p CABG, carotid artery disease s/p R CEA,  HLD, HTN presents for schedule cath.   Hospital Course     Consultants: None  Cath showed  severe native  vessel CAD with patency of a small nondominant RCA and chronic occlusion of the left coronary artery. Mild contraction abnormality of the LV with preserved LVEF. S/P CABG with continued patency of the LIMA-LAD, chronic occlusion of the SVG-OM1 and OM2, and patency of the SVG-OM3/Left PDA.  Severe stenosis of the SVG-PDA/OM3 documented by FFR, treated successfully with a DES (3.5x16 mm Promus DES). Mild hematoma at cath site without bruit. Ambulated without chest pain.   The patient has been seen by Dr. Meda Coffee today and deemed ready for discharge home. All follow-up appointments have been scheduled. Discharge medications are listed below.  _____________   Discharge Vitals Blood pressure 128/60, pulse 74, temperature 97.8 F (36.6 C), temperature source Oral, resp. rate 15, height '5\' 5"'$  (1.651 m), weight 156 lb (70.8 kg), SpO2 99 %.  Filed Weights   11/04/16 1131 11/05/16 0345  Weight: 156 lb (70.8 kg) 156 lb (70.8 kg)    Labs & Radiologic Studies     CBC  Recent Labs  11/03/16 1045 11/05/16 0200  WBC 8.4 5.8  HGB  --  12.2*  HCT 41.2 36.9*  MCV 89 87.4  PLT 259 419   Basic Metabolic Panel  Recent Labs  11/03/16 1045 11/05/16 0200  NA 146* 139  K 4.1 3.2*  CL 107* 106  CO2 23 24  GLUCOSE 123* 105*  BUN 9 9  CREATININE 0.88 0.77  CALCIUM 9.6 8.8*   Liver Function Tests No results for input(s): AST, ALT, ALKPHOS, BILITOT, PROT, ALBUMIN in the  last 72 hours. No results for input(s): LIPASE, AMYLASE in the last 72 hours. Cardiac Enzymes No results for input(s): CKTOTAL, CKMB, CKMBINDEX, TROPONINI in the last 72 hours. BNP Invalid input(s): POCBNP D-Dimer No results for input(s): DDIMER in the last 72 hours. Hemoglobin A1C No results for input(s): HGBA1C in the last 72 hours. Fasting Lipid Panel No results for input(s): CHOL, HDL, LDLCALC, TRIG, CHOLHDL, LDLDIRECT in the last 72 hours. Thyroid Function Tests No results for input(s): TSH, T4TOTAL, T3FREE, THYROIDAB in  the last 72 hours.  Invalid input(s): FREET3  No results found.  Disposition   Pt is being discharged home today in good condition.  Follow-up Plans & Appointments    Follow-up Information    [place], [place]. Go on 11/19/2016.   Specialty:  Cardiology Why:  '@9'$ :30 for post hospital Contact information: [address] [place] [address] [phone]          Discharge Instructions    Amb Referral to Cardiac Rehabilitation    Complete by:  As directed    Diagnosis:  Coronary Stents   Diet - low sodium heart healthy    Complete by:  As directed    Discharge instructions    Complete by:  As directed    No driving for 48 hours. No lifting over 5 lbs for 1 week. No sexual activity for 1 week. Keep procedure site clean & dry. If you notice increased pain, swelling, bleeding or pus, call/return!  You may shower, but no soaking baths/hot tubs/pools for 1 week.   Increase activity slowly    Complete by:  As directed       Discharge Medications   Current Discharge Medication List    CONTINUE these medications which have NOT CHANGED   Details  acetaminophen (TYLENOL) 500 MG tablet Take 500-1,000 mg by mouth 2 (two) times daily as needed for moderate pain.    ALPRAZolam (XANAX) 1 MG tablet Take 1 tablet (1 mg total) by mouth 3 (three) times daily as needed. Qty: 90 tablet, Refills: 2    amLODipine (NORVASC) 10 MG tablet Take 1 tablet (10 mg total) by mouth daily. Qty: 30 tablet, Refills: 2    aspirin EC 81 MG tablet Take 1 tablet (81 mg total) by mouth daily. Qty: 30 tablet, Refills: 4    cholecalciferol (VITAMIN D) 1000 units tablet Take 1,000 Units by mouth daily.    fenofibrate 160 MG tablet TAKE 1 TABLET (160 MG TOTAL) BY MOUTH DAILY. Qty: 30 tablet, Refills: 1    fluticasone (FLONASE) 50 MCG/ACT nasal spray PLACE 2 SPRAYS INTO BOTH NOSTRILS DAILY. Qty: 16 g, Refills: 3    gabapentin (NEURONTIN) 300 MG capsule TAKE ONE CAPSULE BY MOUTH EVERY 8  HOURS Qty: 270 capsule, Refills: 1    hydrochlorothiazide (MICROZIDE) 12.5 MG capsule Take 2 capsules (25 mg total) by mouth daily. Qty: 60 capsule, Refills: 1   Associated Diagnoses: Essential hypertension, benign; Hyperlipemia; SOB (shortness of breath)    HYDROcodone-acetaminophen (NORCO) 10-325 MG tablet Take 1 tablet by mouth every 6 (six) hours as needed (pain).    !! isosorbide mononitrate (IMDUR) 60 MG 24 hr tablet Take 1 tablet (60 mg total) by mouth daily. NEEDS APPOINTMENT FOR FUTURE REFILLS OR 90-DAY SUPPLY Qty: 30 tablet, Refills: 0   Associated Diagnoses: Essential hypertension, benign; SOB (shortness of breath)    metoprolol (LOPRESSOR) 50 MG tablet Take 1 tablet (50 mg total) by mouth 2 (two) times daily. Please keep upcoming appointment for  further refills Qty: 60 tablet, Refills: 0    Multiple Vitamin (MULTIVITAMIN) capsule Take 1 capsule by mouth daily.      nitroGLYCERIN (NITROSTAT) 0.4 MG SL tablet Place 1 tablet (0.4 mg total) under the tongue every 5 (five) minutes as needed for chest pain. Qty: 25 tablet, Refills: 5   Associated Diagnoses: Coronary artery disease involving native coronary artery of native heart with angina pectoris (HCC)    Polyvinyl Alcohol (LUBRICANT DROPS OP) Apply 1 drop to eye daily as needed (dry eyes).    potassium chloride SA (KLOR-CON M20) 20 MEQ tablet Take 1 tablet (20 mEq total) by mouth daily. Qty: 30 tablet, Refills: 11    !! prasugrel (EFFIENT) 10 MG TABS tablet Take 10 mg by mouth daily. Reported on 10/25/2015    rosuvastatin (CRESTOR) 40 MG tablet TAKE 1 TABLET BY MOUTH EVERY DAY Qty: 90 tablet, Refills: 1    !! isosorbide mononitrate (IMDUR) 60 MG 24 hr tablet TAKE 1 TABLET (60 MG TOTAL) BY MOUTH DAILY. PLEASE CONTACT OFFICE FOR ADDITIONAL REFILLS Qty: 30 tablet, Refills: 11   Associated Diagnoses: Essential hypertension, benign; Hyperlipemia; SOB (shortness of breath)    !! prasugrel (EFFIENT) 10 MG TABS tablet TAKE 1  TABLET EVERY DAY Qty: 30 tablet, Refills: 4     !! - Potential duplicate medications found. Please discuss with provider.       Outstanding Labs/Studies   None  Duration of Discharge Encounter   Greater than 30 minutes including physician time.  Signed, Dionisios Ricci PA-C 11/05/2016, 9:13 AM

## 2016-11-13 ENCOUNTER — Encounter: Payer: Self-pay | Admitting: Physician Assistant

## 2016-11-17 NOTE — Progress Notes (Signed)
Cardiology Office Note    Date:  11/19/2016   ID:  [name], DOB [birth year], MRN [id number]  PCP:  Redge Gainer, MD  Cardiologist:  Dr. Burt Knack  Chief Complaint: Hospital follow up s/p   Cath  History of Present Illness:   [name] is a 71 y.o. male CAD s/p CABG, carotid artery disease s/p R CEA, HLD, HTN and lung cancer present for follow up.   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 He underwent PCI of a totally occluded vein graft to the obtuse marginal. He then underwent staged PCI of another saphenous vein graft a few days later.   He presented in 2015 with progressive anginal symptoms and underwent cardiac catheterization. This demonstrated continued patency of the LIMA to LAD and vein graft sequential to OM 3 and left posterolateral. The vein graft sequence to the OM1 and OM 2 branches was totally occluded. He was noted to have mild LV dysfunction with an ejection fraction of 45%. Medical therapy was recommended.  Seen by Dr. Burt Knack 11/03/16 for symptoms concerning for progressive angina CCS class III. Cath showed continued patency of the LIMA-LAD, chronic occlusion of the SVG-OM1 and OM2, and patency of the SVG-OM3/Left PDA. Severe stenosis of the SVG-PDA/OM3 documented by FFR, treated successfully with a DES (3.5x16 mm Promus DES). Mild hematoma at cath site without bruit. Ambulated without chest pain.  Here today for follow-up. He has been walking without any chest pain or shortness of breath. Felt like a new man. Denies orthopnea, PND, syncope, lower extremity edema, palpitation, melena, blood in his stool or urine. He is not interested in cardiac rehabilitation phase II. He is planning to use safety treadmill at home and bicycle riding.  Past Medical History:  Diagnosis Date  . Anxiety    Dr. Corrinne Eagle    . ASCVD (arteriosclerotic cardiovascular disease)   . CAD (coronary artery  disease)    Dr. Verl Blalock  - Cardiologist   . CHF (congestive heart failure) ([place])   . Dementia   . History of radiation therapy 11/15/08-02/23/09   RUL 66Gy/48f  . Hyperlipidemia   . Hypertension   . Lung cancer ([facility]    lung ca dx 1/10  . Neuropathic pain   . OA (osteoarthritis)   . PVD (peripheral vascular disease) ([place]    carotid artery disease  . Radiation May 2010   Right upper lobe lung/Adenocarcinoma    Past Surgical History:  Procedure Laterality Date  . CARDIAC CATHETERIZATION  12/20/04   recurrent   . CARDIAC CATHETERIZATION N/A 11/04/2016   Procedure: Left Heart Cath and Cors/Grafts Angiography;  Surgeon: MSherren Mocha MD;  [facility];  Service: Cardiovascular;  Laterality: N/A;  . CARDIAC CATHETERIZATION N/A 11/04/2016   Procedure: Intravascular Pressure Wire/FFR Study-seq SVG-OM3, PLA;  Surgeon: MSherren Mocha MD;  [facility];  Service: Cardiovascular;  Laterality: N/A;  . CARDIAC CATHETERIZATION N/A 11/04/2016   Procedure: Coronary Stent Intervention;  Surgeon: MSherren Mocha MD;  [facility];  Service: Cardiovascular;  Laterality: N/A;  . CAROTID ENDARTERECTOMY  12/98   right ([place])  . CORONARY ARTERY BYPASS GRAFT  7/95   x5 ([place] )  . LEFT HEART CATHETERIZATION WITH CORONARY ANGIOGRAM N/A 12/16/2013   Procedure: LEFT HEART CATHETERIZATION WITH CORONARY ANGIOGRAM;  Surgeon: MBlane Ohara MD;  [facility];  Service: Cardiovascular;  Laterality: N/A;  .  LUNG CANCER SURGERY     had ratiation of right lung  . PERIPHERAL VASCULAR CATHETERIZATION Right 10/30/2015   Procedure: Carotid PTA/Stent Intervention;  Surgeon: Lorretta Harp, MD;  Location: [facility];  Service: Cardiovascular;  Laterality: Right;    Current Medications: Prior to Admission medications   Medication Sig Start Date End Date Taking? Authorizing Provider  acetaminophen (TYLENOL) 500 MG tablet Take 500-1,000 mg by mouth 2  (two) times daily as needed for moderate pain.    Historical Provider, MD  ALPRAZolam Duanne Moron) 1 MG tablet Take 1 tablet (1 mg total) by mouth 3 (three) times daily as needed. Patient taking differently: Take 1 mg by mouth 3 (three) times daily as needed for anxiety.  09/08/16   Chipper Herb, MD  amLODipine (NORVASC) 10 MG tablet TAKE 1 TABLET (10 MG TOTAL) BY MOUTH DAILY. 11/06/16   [name], MD  aspirin EC 81 MG tablet Take 1 tablet (81 mg total) by mouth daily. 12/09/13   [name], MD  cholecalciferol (VITAMIN D) 1000 units tablet Take 1,000 Units by mouth daily.    Historical Provider, MD  fenofibrate 160 MG tablet TAKE 1 TABLET (160 MG TOTAL) BY MOUTH DAILY. 10/29/16   Chipper Herb, MD  fluticasone (FLONASE) 50 MCG/ACT nasal spray PLACE 2 SPRAYS INTO BOTH NOSTRILS DAILY. 08/11/16   Chipper Herb, MD  gabapentin (NEURONTIN) 300 MG capsule TAKE ONE CAPSULE BY MOUTH EVERY 8 HOURS 06/03/16   Chipper Herb, MD  hydrochlorothiazide (MICROZIDE) 12.5 MG capsule Take 2 capsules (25 mg total) by mouth daily. Patient taking differently: Take 12.5 mg by mouth 2 (two) times daily.  09/03/16   Lorretta Harp, MD  HYDROcodone-acetaminophen (NORCO) 10-325 MG tablet Take 1 tablet by mouth every 6 (six) hours as needed (pain).    Historical Provider, MD  isosorbide mononitrate (IMDUR) 60 MG 24 hr tablet Take 1 tablet (60 mg total) by mouth daily. NEEDS APPOINTMENT FOR FUTURE REFILLS OR 90-DAY SUPPLY 10/09/16   Lorretta Harp, MD  isosorbide mononitrate (IMDUR) 60 MG 24 hr tablet TAKE 1 TABLET (60 MG TOTAL) BY MOUTH DAILY. PLEASE CONTACT OFFICE FOR ADDITIONAL REFILLS 11/03/16   Lorretta Harp, MD  metoprolol (LOPRESSOR) 50 MG tablet Take 1 tablet (50 mg total) by mouth 2 (two) times daily. Please keep upcoming appointment for further refills 10/28/16   [name], MD  Multiple Vitamin (MULTIVITAMIN) capsule Take 1 capsule by mouth daily.      Historical Provider, MD  nitroGLYCERIN (NITROSTAT) 0.4 MG  SL tablet Place 1 tablet (0.4 mg total) under the tongue every 5 (five) minutes as needed for chest pain. 11/03/16   [name], MD  Polyvinyl Alcohol (LUBRICANT DROPS OP) Apply 1 drop to eye daily as needed (dry eyes).    Historical Provider, MD  potassium chloride SA (KLOR-CON M20) 20 MEQ tablet Take 1 tablet (20 mEq total) by mouth daily. 07/09/16   [name], MD  prasugrel (EFFIENT) 10 MG TABS tablet Take 10 mg by mouth daily. Reported on 10/25/2015    Historical Provider, MD  prasugrel (EFFIENT) 10 MG TABS tablet TAKE 1 TABLET EVERY DAY 11/03/16   Chipper Herb, MD  rosuvastatin (CRESTOR) 40 MG tablet TAKE 1 TABLET BY MOUTH EVERY DAY 06/02/16   Chipper Herb, MD    Allergies:   Penicillins; Clopidogrel bisulfate; Demerol; and Propoxyphene n-acetaminophen   Social History   Social History  . Marital status: Married    Spouse  name: N/A  . Number of children: N/A  . Years of education: N/A   Social History Main Topics  . Smoking status: Former Research scientist (life sciences)  . Smokeless tobacco: Former Systems developer  . Alcohol use No  . Drug use: No  . Sexual activity: Not Asked   Other Topics Concern  . None   Social History Narrative  . None     Family History:  The patient's family history includes Coronary artery disease in his other; Heart attack in his mother; Heart disease in his brother, father, and sister.   ROS:   Please see the history of present illness.    ROS All other systems reviewed and are negative.   PHYSICAL EXAM:   VS:  BP (!) 120/50   Pulse 62   Ht '5\' 5"'$  (1.651 m)   Wt 157 lb 6.4 oz (71.4 kg)   BMI 26.19 kg/m    GEN: Well nourished, well developed, in no acute distress  HEENT: normal  Neck: no JVD, carotid bruits, or masses Cardiac: RRR; no murmurs, rubs, or gallops,no edema. Left radial cath site without hematoma. Respiratory:  clear to auscultation bilaterally, normal work of breathing GI: soft, nontender, nondistended, + BS MS: no deformity or atrophy  Skin: warm  and dry, no rash Neuro:  Alert and Oriented x 3, Strength and sensation are intact Psych: euthymic mood, full affect  Wt Readings from Last 3 Encounters:  11/19/16 157 lb 6.4 oz (71.4 kg)  11/05/16 156 lb (70.8 kg)  11/03/16 156 lb 1.9 oz (70.8 kg)      Studies/Labs Reviewed:   EKG:  EKG is not ordered today.    Recent Labs: 05/19/2016: ALT 16 11/05/2016: BUN 9; Creatinine, Ser 0.77; Hemoglobin 12.2; Platelets 193; Potassium 3.2; Sodium 139   Lipid Panel    Component Value Date/Time   CHOL 104 05/19/2016 1011   CHOL 127 03/17/2013 0928   TRIG 126 05/19/2016 1011   TRIG 148 03/17/2013 0928   HDL 32 (L) 05/19/2016 1011   HDL 34 (L) 03/17/2013 0928   CHOLHDL 3.3 11/16/2010 0400   VLDL 27 11/16/2010 0400   LDLCALC 72 07/17/2014 0943   LDLCALC 63 03/17/2013 0928    Additional studies/ records that were reviewed today include:     Cardiac Catheterization: 11/04/16 Coronary Stent Intervention  Intravascular Pressure Wire/FFR Study-seq SVG-OM3, PLA  Left Heart Cath and Cors/Grafts Angiography  Conclusion   1. Severe native vessel CAD with patency of a small nondominant RCA and chronic occlusion of the left coronary artery.  2. Mild contraction abnormality of the LV with preserved LVEF 3. S/P CABG with continued patency of the LIMA-LAD, chronic occlusion of the SVG-OM1 and OM2, and patency of the SVG-OM3/Left PDA.  4. Severe stenosis of the SVG-PDA/OM3 documented by FFR, treated successfully with a DES (3.5x16 mm Promus DES)  Continue home medical therapy. Anticipate DC home tomorrow.        ASSESSMENT & PLAN:    1. CAD s/p CABG - Recent cath showed continued patency of the LIMA-LAD, chronic occlusion of the SVG-OM1 and OM2, and patency of the SVG-OM3/Left PDA. Severe stenosis of the SVG-PDA/OM3 documented by FFR, treated successfully with a DES (3.5x16 mm Promus DES). - No further chest pain or dyspnea. Continue aspirin and Effient.  2. HTN - stable and well  controlled on current medication.  3. HLD - 05/19/2016: Cholesterol 104; HDL Cholesterol by NMR 32; Triglycerides by NMR 126  - Continue statin   4. Carotid artery  diease s/p R CEA He has 60-79% stenosis on the right and chronic occlusion of the left carotid artery. - Recent doppler 09/2016 showed stable finding. F/u in 1 year.   Medication Adjustments/Labs and Tests Ordered: Current medicines are reviewed at length with the patient today.  Concerns regarding medicines are outlined above.  Medication changes, Labs and Tests ordered today are listed in the Patient Instructions below. Patient Instructions  Your physician recommends that you continue on your current medications as directed. Please refer to the Current Medication list given to you today.   Your physician recommends that you schedule a follow-up appointment in: [place], [place], [place]  11/19/2016 9:33 AM    [address], [place], [place]  [address] Phone: [phone]; Fax: [phone]

## 2016-11-19 ENCOUNTER — Encounter: Payer: Self-pay | Admitting: Physician Assistant

## 2016-11-19 ENCOUNTER — Ambulatory Visit (INDEPENDENT_AMBULATORY_CARE_PROVIDER_SITE_OTHER): Payer: Medicare Other | Admitting: Physician Assistant

## 2016-11-19 VITALS — BP 120/50 | HR 62 | Ht 65.0 in | Wt 157.4 lb

## 2016-11-19 DIAGNOSIS — I6523 Occlusion and stenosis of bilateral carotid arteries: Secondary | ICD-10-CM | POA: Diagnosis not present

## 2016-11-19 DIAGNOSIS — I25119 Atherosclerotic heart disease of native coronary artery with unspecified angina pectoris: Secondary | ICD-10-CM

## 2016-11-19 DIAGNOSIS — I209 Angina pectoris, unspecified: Secondary | ICD-10-CM

## 2016-11-19 DIAGNOSIS — E785 Hyperlipidemia, unspecified: Secondary | ICD-10-CM | POA: Diagnosis not present

## 2016-11-19 DIAGNOSIS — I10 Essential (primary) hypertension: Secondary | ICD-10-CM | POA: Diagnosis not present

## 2016-11-19 MED ORDER — METOPROLOL TARTRATE 50 MG PO TABS: 50.0000 mg | ORAL_TABLET | Freq: Two times a day (BID) | ORAL | 3 refills | Status: AC

## 2016-11-19 NOTE — Patient Instructions (Signed)
Your physician recommends that you continue on your current medications as directed. Please refer to the Current Medication list given to you today.   Your physician recommends that you schedule a follow-up appointment in: [place]

## 2016-11-24 ENCOUNTER — Ambulatory Visit (INDEPENDENT_AMBULATORY_CARE_PROVIDER_SITE_OTHER): Payer: Medicare Other | Admitting: *Deleted

## 2016-11-24 DIAGNOSIS — E538 Deficiency of other specified B group vitamins: Secondary | ICD-10-CM

## 2016-11-24 NOTE — Progress Notes (Signed)
Pt given Vit B12 inj Tolerated well 

## 2016-11-27 ENCOUNTER — Other Ambulatory Visit: Payer: Self-pay | Admitting: Family Medicine

## 2016-12-04 ENCOUNTER — Ambulatory Visit (INDEPENDENT_AMBULATORY_CARE_PROVIDER_SITE_OTHER): Payer: Medicare Other | Admitting: Pediatrics

## 2016-12-04 ENCOUNTER — Encounter: Payer: Self-pay | Admitting: Pediatrics

## 2016-12-04 ENCOUNTER — Ambulatory Visit: Payer: Medicare Other | Admitting: Family Medicine

## 2016-12-04 VITALS — BP 131/59 | HR 48 | Temp 97.3°F | Ht 65.0 in | Wt 156.0 lb

## 2016-12-04 DIAGNOSIS — G894 Chronic pain syndrome: Secondary | ICD-10-CM

## 2016-12-04 DIAGNOSIS — I6523 Occlusion and stenosis of bilateral carotid arteries: Secondary | ICD-10-CM | POA: Diagnosis not present

## 2016-12-04 DIAGNOSIS — F419 Anxiety disorder, unspecified: Secondary | ICD-10-CM

## 2016-12-04 DIAGNOSIS — E876 Hypokalemia: Secondary | ICD-10-CM | POA: Diagnosis not present

## 2016-12-04 LAB — BMP8+EGFR
BUN/Creatinine Ratio: 13 (ref 10–24)
BUN: 11 mg/dL (ref 8–27)
CALCIUM: 9.3 mg/dL (ref 8.6–10.2)
CHLORIDE: 101 mmol/L (ref 96–106)
CO2: 25 mmol/L (ref 18–29)
Creatinine, Ser: 0.85 mg/dL (ref 0.76–1.27)
GFR calc non Af Amer: 88 mL/min/{1.73_m2} (ref >59)
GFR, EST AFRICAN AMERICAN: 102 mL/min/{1.73_m2} (ref >59)
Glucose: 87 mg/dL (ref 65–99)
POTASSIUM: 4 mmol/L (ref 3.5–5.2)
Sodium: 141 mmol/L (ref 134–144)

## 2016-12-04 MED ORDER — HYDROCODONE-ACETAMINOPHEN 10-325 MG PO TABS: 1.0000 | ORAL_TABLET | Freq: Four times a day (QID) | ORAL | 0 refills | Status: DC | PRN

## 2016-12-04 MED ORDER — ALPRAZOLAM 1 MG PO TABS: 1.0000 mg | ORAL_TABLET | Freq: Two times a day (BID) | ORAL | 0 refills | Status: DC | PRN

## 2016-12-04 MED ORDER — ALPRAZOLAM 1 MG PO TABS: 1.0000 mg | ORAL_TABLET | Freq: Three times a day (TID) | ORAL | 2 refills | Status: DC | PRN

## 2016-12-04 MED ORDER — HYDROCODONE-ACETAMINOPHEN 10-325 MG PO TABS: 1.0000 | ORAL_TABLET | Freq: Three times a day (TID) | ORAL | 0 refills | Status: DC | PRN

## 2016-12-04 MED ORDER — ALPRAZOLAM 1 MG PO TABS: 1.0000 mg | ORAL_TABLET | Freq: Every evening | ORAL | 0 refills | Status: DC | PRN

## 2016-12-04 NOTE — Progress Notes (Signed)
  Subjective:   Patient ID: [name], male    DOB: 12/12/1945, 71 y.o.   MRN: [id number] CC: Follow-up (3 month) pain medication, anxiety HPI: [name] is a 71 y.o. 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 Runs from legs up to hips. Arms have tingling Pain on scale of 1-10- 9/10 at its worst, with pain medication down to 5-6/10 Frequency- every day, night What increases pain- sometimes getting up moving around makes it worse What makes pain Better- narcotic medication helping some Effects on ADL - helps him get through the day, up doing the things he wants Any change in general medical condition- no  Current medications- hydrocodone 10 mg QID Effectiveness of current meds- helping a lot Adverse reactions form pain meds-no s/e, regular bowel movements Morphine equivalent   Pill count performed-Yes Urine drug screen- Yes, completed 09/2016 Was the [place] reviewed- yes  If yes were their any concerning findings? - yes  Also with ongoing anxiety symptoms Takes xanax three times a day Has been tried on alternate medications in the past  Relevant past medical, surgical, family and social history reviewed. Allergies and medications reviewed and updated. History  Smoking Status  . Former Smoker  Smokeless Tobacco  . Former Systems developer   ROS: Per HPI   Objective:    BP (!) 131/59   Pulse (!) 48   Temp 97.3 F (36.3 C) (Oral)   Ht _0  (1.651 m)   Wt 156 lb (70.8 kg)   BMI 25.96 kg/m   Wt Readings from Last 3 Encounters:  12/04/16 156 lb (70.8 kg)  11/19/16 157 lb 6.4 oz (71.4 kg)  11/05/16 156 lb (70.8 kg)    Gen: NAD, alert, cooperative with exam, NCAT EYES: EOMI, no conjunctival injection, or no icterus CV: NRRR, normal S1/S2, no murmur Resp: CTABL, no wheezes, normal WOB Abd: +BS, soft, NTND. no guarding or organomegaly Ext: No edema, warm Neuro: Alert  and oriented, strength equal b/l UE and LE, coordination grossly normal MSK: normal muscle bulk  Assessment & Plan:  [name] was seen today for follow-up multiple med problems  Diagnoses and all orders for this visit:  Hypokalemia Recheck K level -     BMP8+EGFR  Anxiety Takes when feeling anxious, has been on for years -     ALPRAZolam (XANAX) 1 MG tablet; Take 1 tablet (1 mg total) by mouth 3 (three) times daily as needed for anxiety.  Chronic pain syndrome Pain medication improves quality of life, up moving around more Discussed risk of being on both hydrocodone and xanax with pt and his wife. He says he takes only as needed, has been on stable doses, does not take at the same time. Will continue for now 3 Rx of below written dated 3/1, 4/1, 5/1 for #120 tabs -     HYDROcodone-acetaminophen (NORCO) 10-325 MG tablet; Take 1 tablet by mouth every 8 (eight) hours as needed.  Follow up plan: 3 mo Assunta Found, MD [place]

## 2016-12-06 ENCOUNTER — Other Ambulatory Visit: Payer: Self-pay | Admitting: Cardiovascular Disease

## 2016-12-06 DIAGNOSIS — R0602 Shortness of breath: Secondary | ICD-10-CM

## 2016-12-06 DIAGNOSIS — I10 Essential (primary) hypertension: Secondary | ICD-10-CM

## 2016-12-06 DIAGNOSIS — E785 Hyperlipidemia, unspecified: Secondary | ICD-10-CM

## 2016-12-08 NOTE — Telephone Encounter (Signed)
Rx has been sent to the pharmacy electronically. ° °

## 2016-12-23 ENCOUNTER — Ambulatory Visit (INDEPENDENT_AMBULATORY_CARE_PROVIDER_SITE_OTHER): Payer: Medicare Other | Admitting: *Deleted

## 2016-12-23 DIAGNOSIS — E538 Deficiency of other specified B group vitamins: Secondary | ICD-10-CM

## 2016-12-23 NOTE — Progress Notes (Signed)
Pt given Vit B12 inj Tolerated well 

## 2016-12-28 ENCOUNTER — Other Ambulatory Visit: Payer: Self-pay | Admitting: Family Medicine

## 2017-01-09 ENCOUNTER — Other Ambulatory Visit: Payer: Self-pay | Admitting: Family Medicine

## 2017-01-26 ENCOUNTER — Ambulatory Visit (INDEPENDENT_AMBULATORY_CARE_PROVIDER_SITE_OTHER): Payer: Medicare Other

## 2017-01-26 DIAGNOSIS — E538 Deficiency of other specified B group vitamins: Secondary | ICD-10-CM

## 2017-02-03 ENCOUNTER — Other Ambulatory Visit: Payer: Self-pay | Admitting: *Deleted

## 2017-02-03 ENCOUNTER — Other Ambulatory Visit: Payer: Self-pay | Admitting: Nurse Practitioner

## 2017-02-03 DIAGNOSIS — G894 Chronic pain syndrome: Secondary | ICD-10-CM

## 2017-02-03 MED ORDER — HYDROCODONE-ACETAMINOPHEN 10-325 MG PO TABS: 1.0000 | ORAL_TABLET | Freq: Four times a day (QID) | ORAL | 0 refills | Status: DC | PRN

## 2017-02-03 NOTE — Telephone Encounter (Signed)
Patient's wife came by today stating that Hyrdocodone Rx was not written correct.   Wife states that Rx is suppose to be for every 6 hours.  Reprinted Rx and given to Rx

## 2017-02-26 ENCOUNTER — Ambulatory Visit (INDEPENDENT_AMBULATORY_CARE_PROVIDER_SITE_OTHER): Payer: Medicare Other | Admitting: *Deleted

## 2017-02-26 DIAGNOSIS — E538 Deficiency of other specified B group vitamins: Secondary | ICD-10-CM

## 2017-02-26 NOTE — Progress Notes (Signed)
Pt given B12 injection IM right deltoid and tolerated well.

## 2017-03-03 ENCOUNTER — Other Ambulatory Visit: Payer: Self-pay | Admitting: Family Medicine

## 2017-03-03 ENCOUNTER — Other Ambulatory Visit: Payer: Self-pay | Admitting: Pediatrics

## 2017-03-03 DIAGNOSIS — F419 Anxiety disorder, unspecified: Secondary | ICD-10-CM

## 2017-03-04 NOTE — Telephone Encounter (Signed)
Last seen 12/04/16- vincent

## 2017-03-04 NOTE — Telephone Encounter (Signed)
Last filled 02/02/17, has appt 03/10/17. Call in

## 2017-03-04 NOTE — Telephone Encounter (Signed)
Rx called in 

## 2017-03-10 ENCOUNTER — Ambulatory Visit (INDEPENDENT_AMBULATORY_CARE_PROVIDER_SITE_OTHER): Payer: Medicare Other | Admitting: Family Medicine

## 2017-03-10 ENCOUNTER — Encounter: Payer: Self-pay | Admitting: Family Medicine

## 2017-03-10 VITALS — BP 160/65 | HR 59 | Temp 97.0°F | Ht 65.0 in | Wt 154.0 lb

## 2017-03-10 DIAGNOSIS — R52 Pain, unspecified: Secondary | ICD-10-CM

## 2017-03-10 DIAGNOSIS — R0989 Other specified symptoms and signs involving the circulatory and respiratory systems: Secondary | ICD-10-CM | POA: Diagnosis not present

## 2017-03-10 DIAGNOSIS — J701 Chronic and other pulmonary manifestations due to radiation: Secondary | ICD-10-CM | POA: Diagnosis not present

## 2017-03-10 DIAGNOSIS — I6523 Occlusion and stenosis of bilateral carotid arteries: Secondary | ICD-10-CM

## 2017-03-10 DIAGNOSIS — G894 Chronic pain syndrome: Secondary | ICD-10-CM | POA: Diagnosis not present

## 2017-03-10 DIAGNOSIS — I739 Peripheral vascular disease, unspecified: Secondary | ICD-10-CM | POA: Diagnosis not present

## 2017-03-10 DIAGNOSIS — D696 Thrombocytopenia, unspecified: Secondary | ICD-10-CM | POA: Diagnosis not present

## 2017-03-10 DIAGNOSIS — Z85118 Personal history of other malignant neoplasm of bronchus and lung: Secondary | ICD-10-CM | POA: Diagnosis not present

## 2017-03-10 DIAGNOSIS — I10 Essential (primary) hypertension: Secondary | ICD-10-CM

## 2017-03-10 DIAGNOSIS — G620 Drug-induced polyneuropathy: Secondary | ICD-10-CM | POA: Diagnosis not present

## 2017-03-10 DIAGNOSIS — N400 Enlarged prostate without lower urinary tract symptoms: Secondary | ICD-10-CM

## 2017-03-10 DIAGNOSIS — E559 Vitamin D deficiency, unspecified: Secondary | ICD-10-CM

## 2017-03-10 DIAGNOSIS — T451X5A Adverse effect of antineoplastic and immunosuppressive drugs, initial encounter: Secondary | ICD-10-CM

## 2017-03-10 DIAGNOSIS — E7800 Pure hypercholesterolemia, unspecified: Secondary | ICD-10-CM

## 2017-03-10 MED ORDER — HYDROCODONE-ACETAMINOPHEN 10-325 MG PO TABS: 1.0000 | ORAL_TABLET | Freq: Four times a day (QID) | ORAL | 0 refills | Status: AC | PRN

## 2017-03-10 MED ORDER — HYDROCODONE-ACETAMINOPHEN 10-325 MG PO TABS: 1.0000 | ORAL_TABLET | Freq: Four times a day (QID) | ORAL | 0 refills | Status: DC | PRN

## 2017-03-10 NOTE — Progress Notes (Signed)
Subjective:    Patient ID: [name], male    DOB: 20-Dec-1945, 71 y.o.   MRN: [id number]  HPI  Pt here for follow up and management of chronic medical problems which includes hypertension and hyperlipidemia. He is taking medication regularly.The patient comes in for his pain management treatment. 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 He had to take chemotherapy for this. 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 He also has aortic atherosclerosis. Most of the pain is due to the side effects of the chemotherapy in his legs. He is followed regularly by the cardiologist because of his coronary artery disease. He has severe native coronary artery disease and has had bypass surgery. The cardiac were reviewed prior to seeing him in the exam room today. He still sees the radiation oncologist regularly. Since the patient has had recent stents done in January he is had only one episode of pain. The cardiologist has to see him back again in the middle of this month. He denies any shortness of breath. He denies any trouble with his stomach including nausea vomiting diarrhea or blood in the stool he keeps a close check on this he says. He's passing his water without problems. His blood pressure was elevated on both occasions today and he will be seen the cardiologist soon so we will not make any changes with his blood pressure medicine. He has been out of his pain medicine for a few days and this could possibly panel role with his elevated blood pressure because he's had more pain.      Patient Active Problem List   Diagnosis Date Noted  . Coronary artery disease with exertional angina ([place])   . Right carotid bruit 11/23/2014  . Claudication ([place]) 11/23/2014  . Thrombocytopenia ([place]) 07/17/2014  . Radiation fibrosis of lung ([place]) 07/17/2014  . Peripheral vascular insufficiency ([place])  07/17/2014  . Peripheral neuropathy due to chemotherapy ([place]) 07/17/2014  . Allergic rhinitis due to pollen 07/17/2014  . Adenocarcinoma of the right upper lobe 03/09/2014  . BPH (benign prostatic hyperplasia) 03/09/2014  . Need for prophylactic vaccination and inoculation against influenza 09/24/2013  . Hyperlipemia 09/24/2013  . Ecchymosis 09/24/2013  . Mild eczema 09/24/2013  . Chronic pain syndrome 06/21/2013  . Anxiety 03/17/2013  . FEMORAL BRUIT 12/04/2010  . HYPERTENSION, BENIGN 12/13/2008  . CAD, ARTERY BYPASS GRAFT 12/13/2008  . 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 MYOCARDIAL INFARCTION 10/16/2008  . Swelling, mass, or lump in chest 10/16/2008   Outpatient Encounter Prescriptions as of 03/10/2017  Medication Sig  . acetaminophen (TYLENOL) 500 MG tablet Take 500-1,000 mg by mouth 2 (two) times daily as needed for moderate pain.  Marland Kitchen ALPRAZolam (XANAX) 1 MG tablet TAKE 1 TABLET BY MOUTH THREE TIMES A DAY AS NEEDED FOR ANXIETY  . amLODipine (NORVASC) 10 MG tablet TAKE 1 TABLET (10 MG TOTAL) BY MOUTH DAILY.  Marland Kitchen aspirin EC 81 MG tablet Take 1 tablet (81 mg total) by mouth daily.  . cholecalciferol (VITAMIN D) 1000 units tablet Take 1,000 Units by mouth daily.  . fenofibrate 160 MG tablet TAKE 1 TABLET (160 MG TOTAL) BY MOUTH DAILY.  . fluticasone (FLONASE) 50 MCG/ACT nasal spray PLACE 2 SPRAYS INTO BOTH NOSTRILS DAILY.  Marland Kitchen gabapentin (NEURONTIN) 300 MG capsule TAKE ONE CAPSULE BY MOUTH EVERY 8 HOURS  . hydrochlorothiazide (MICROZIDE) 12.5 MG capsule Take 12.5  mg by mouth 2 (two) times daily.  . hydrochlorothiazide (MICROZIDE) 12.5 MG capsule TAKE 2 CAPSULES (25 MG TOTAL) BY MOUTH DAILY.  Marland Kitchen HYDROcodone-acetaminophen (NORCO) 10-325 MG tablet Take 1 tablet by mouth every 8 (eight) hours as needed.  Marland Kitchen HYDROcodone-acetaminophen (NORCO) 10-325 MG tablet Take 1 tablet by mouth every 6 (six) hours as needed (pain).  Marland Kitchen HYDROcodone-acetaminophen (NORCO) 10-325 MG tablet Take  1 tablet by mouth every 6 (six) hours as needed.  . isosorbide mononitrate (IMDUR) 60 MG 24 hr tablet Take 1 tablet (60 mg total) by mouth daily. NEEDS APPOINTMENT FOR FUTURE REFILLS OR 90-DAY SUPPLY  . metoprolol (LOPRESSOR) 50 MG tablet Take 1 tablet (50 mg total) by mouth 2 (two) times daily.  . Multiple Vitamin (MULTIVITAMIN) capsule Take 1 capsule by mouth daily.    . nitroGLYCERIN (NITROSTAT) 0.4 MG SL tablet Place 1 tablet (0.4 mg total) under the tongue every 5 (five) minutes as needed for chest pain.  . Polyvinyl Alcohol (LUBRICANT DROPS OP) Apply 1 drop to eye daily as needed (dry eyes).  . potassium chloride SA (KLOR-CON M20) 20 MEQ tablet Take 1 tablet (20 mEq total) by mouth daily.  . prasugrel (EFFIENT) 10 MG TABS tablet Take 10 mg by mouth daily. Reported on 10/25/2015  . rosuvastatin (CRESTOR) 40 MG tablet TAKE 1 TABLET BY MOUTH EVERY DAY   Facility-Administered Encounter Medications as of 03/10/2017  Medication  . cyanocobalamin ((VITAMIN B-12)) injection 1,000 mcg     Review of Systems  Constitutional: Negative.   HENT: Negative.   Eyes: Negative.   Respiratory: Negative.   Cardiovascular: Negative.   Gastrointestinal: Negative.   Endocrine: Negative.   Genitourinary: Negative.   Musculoskeletal: Positive for arthralgias (knee, hips and arms).  Skin: Negative.   Allergic/Immunologic: Negative.   Neurological: Negative.   Hematological: Negative.   Psychiatric/Behavioral: Negative.        Objective:   Physical Exam  Constitutional: He is oriented to person, place, and time. He appears well-developed and well-nourished. No distress.  The patient is pleasant and relaxed  HENT:  Head: Normocephalic and atraumatic.  Right Ear: External ear normal.  Left Ear: External ear normal.  Nose: Nose normal.  Mouth/Throat: Oropharynx is clear and moist. No oropharyngeal exudate.  Eyes: Conjunctivae and EOM are normal. Pupils are equal, round, and reactive to light. Right  eye exhibits no discharge. Left eye exhibits no discharge. No scleral icterus.  Neck: Normal range of motion. Neck supple. No thyromegaly present.  He has bilateral carotid bruits  Cardiovascular: Normal rate, regular rhythm and normal heart sounds.   No murmur heard. Distal pulses were difficult to palpate The heart is regular at 72/m  Pulmonary/Chest: Effort normal and breath sounds normal. No respiratory distress. He has no wheezes. He has no rales. He exhibits no tenderness.  The chest is clear anteriorly and posteriorly and there is no axillary adenopathy  Abdominal: Soft. Bowel sounds are normal. He exhibits no mass. There is no tenderness. There is no rebound and no guarding.  No abdominal tenderness masses or bruits. Good inguinal pulses.  Musculoskeletal: Normal range of motion. He exhibits no edema.  Lymphadenopathy:    He has no cervical adenopathy.  Neurological: He is alert and oriented to person, place, and time. He has normal reflexes. No cranial nerve deficit.  Skin: Skin is warm and dry. No rash noted.  Psychiatric: He has a normal mood and affect. His behavior is normal. Judgment and thought content normal.  Nursing note and  vitals reviewed.  BP (!) 176/71 (BP Location: Left Arm)   Pulse (!) 58   Temp 97 F (36.1 C) (Oral)   Ht 5' 5"  (1.651 m)   Wt 154 lb (69.9 kg)   BMI 25.63 kg/m         Assessment & Plan:  1. Thrombocytopenia (HCC) -No history or signs of any bleeding or bruising problems secondary to his low platelets. - CBC with Differential/Platelet - CBC with Differential/Platelet  2. Benign prostatic hyperplasia, unspecified whether lower urinary tract symptoms present -No complaints today with voiding - CBC with Differential/Platelet - CBC with Differential/Platelet  3. Pain management -The patient agrees to take as little pain pill as possible he takes this because of his peripheral neuropathy secondary to his chemotherapy. - CBC with  Differential/Platelet - CBC with Differential/Platelet  4. Chronic pain syndrome -Take as little pain medication as possible - CBC with Differential/Platelet - CBC with Differential/Platelet  5. Vitamin D deficiency -Continue current treatment pending results of lab work - CBC with Differential/Platelet - VITAMIN D 25 Hydroxy (Vit-D Deficiency, Fractures) - CBC with Differential/Platelet - VITAMIN D 25 Hydroxy (Vit-D Deficiency, Fractures)  6. HYPERTENSION, BENIGN -The blood pressure is elevated today but we will not make any changes in treatment especially since he is going to be seen the cardiologist shortly and he's been out of his pain medicines recently. - CBC with Differential/Platelet - BMP8+EGFR - Hepatic function panel - BMP8+EGFR - CBC with Differential/Platelet - Hepatic function panel  7. Pure hypercholesterolemia -Continue aggressive therapeutic lifestyle changes pending results of lab work and continue current statin drug. - CBC with Differential/Platelet - Lipid panel - CBC with Differential/Platelet - Lipid panel  8. Peripheral neuropathy due to chemotherapy ([place]) -Take pain medication as needed and exercise as much as possible  9. Peripheral vascular insufficiency (HCC) -Follow-up with periodic  10. Claudication [facility]) -Exercise regularly and get Dopplers checked periodically as determined by cardiology  11. Radiation fibrosis of lung ([employer]) -Follow-up with radiation oncology  12. Adenocarcinoma of the right upper lobe -Follow-up with radiation oncology  13. Bilateral carotid bruits -Follow-up with cardiology with periodic Dopplers  Meds ordered this encounter  Medications  . HYDROcodone-acetaminophen (NORCO) 10-325 MG tablet    Sig: Take 1 tablet by mouth every 6 (six) hours as needed.    Dispense:  120 tablet    Refill:  0    DO NOT FILL UNTIL 04/07/17  . HYDROcodone-acetaminophen (NORCO) 10-325 MG tablet    Sig: Take 1 tablet by mouth every 6  (six) hours as needed (pain).    Dispense:  120 tablet    Refill:  0    Do not fill until 05/07/17.  Marland Kitchen HYDROcodone-acetaminophen (NORCO) 10-325 MG tablet    Sig: Take 1 tablet by mouth every 6 (six) hours as needed.    Dispense:  120 tablet    Refill:  0   Patient Instructions                       Medicare Annual Wellness Visit  [facility] and the medical providers at [facility] strive to bring you the best medical care.  In doing so we not only want to address your current medical conditions and concerns but also to detect new conditions early and prevent illness, disease and health-related problems.    Medicare offers a yearly Wellness Visit which allows our clinical staff to assess your need for preventative services including immunizations,  lifestyle education, counseling to decrease risk of preventable diseases and screening for fall risk and other medical concerns.    This visit is provided free of charge (no copay) for all Medicare recipients. The clinical pharmacists at [facility] have begun to conduct these Wellness Visits which will also include a thorough review of all your medications.    As you primary medical provider recommend that you make an appointment for your Annual Wellness Visit if you have not done so already this year.  You may set up this appointment before you leave today or you may call back ([phone]) and schedule an appointment.  Please make sure when you call that you mention that you are scheduling your Annual Wellness Visit with the clinical pharmacist so that the appointment may be made for the proper length of time.     Continue current medications. Continue good therapeutic lifestyle changes which include good diet and exercise. Fall precautions discussed with patient. If an FOBT was given today- please return it to our front desk. If you are over 69 years old - you may need Prevnar 31 or the adult Pneumonia  vaccine.  **Flu shots are available--- please call and schedule a FLU-CLINIC appointment**  After your visit with [place] today you will receive a survey in the mail or online from [employer] regarding your care with [place]. Please take a moment to fill this out. Your feedback is very important to [place] as you can help [place] better understand your patient needs as well as improve your experience and satisfaction. WE CARE ABOUT YOU!!!  Follow-up with radiation oncologist as planned  continue to follow-up with cardiology Periodic monitoring of circulation to her legs and head is important and you are getting this done. Stay is active as possible and rest during the cooler parts of the day and always stay well hydrated     Arrie Senate MD

## 2017-03-10 NOTE — Patient Instructions (Addendum)
Medicare Annual Wellness Visit  [facility] and the medical providers at [facility] strive to bring you the best medical care.  In doing so we not only want to address your current medical conditions and concerns but also to detect new conditions early and prevent illness, disease and health-related problems.    Medicare offers a yearly Wellness Visit which allows our clinical staff to assess your need for preventative services including immunizations, lifestyle education, counseling to decrease risk of preventable diseases and screening for fall risk and other medical concerns.    This visit is provided free of charge (no copay) for all Medicare recipients. The clinical pharmacists at [facility] have begun to conduct these Wellness Visits which will also include a thorough review of all your medications.    As you primary medical provider recommend that you make an appointment for your Annual Wellness Visit if you have not done so already this year.  You may set up this appointment before you leave today or you may call back ([phone]) and schedule an appointment.  Please make sure when you call that you mention that you are scheduling your Annual Wellness Visit with the clinical pharmacist so that the appointment may be made for the proper length of time.     Continue current medications. Continue good therapeutic lifestyle changes which include good diet and exercise. Fall precautions discussed with patient. If an FOBT was given today- please return it to our front desk. If you are over 71 years old - you may need Prevnar 58 or the adult Pneumonia vaccine.  **Flu shots are available--- please call and schedule a FLU-CLINIC appointment**  After your visit with [place] today you will receive a survey in the mail or online from [employer] regarding your care with [place]. Please take a moment to fill this out. Your feedback is very  important to [place] as you can help [place] better understand your patient needs as well as improve your experience and satisfaction. WE CARE ABOUT YOU!!!  Follow-up with radiation oncologist as planned  continue to follow-up with cardiology Periodic monitoring of circulation to her legs and head is important and you are getting this done. Stay is active as possible and rest during the cooler parts of the day and always stay well hydrated

## 2017-03-11 LAB — CBC WITH DIFFERENTIAL/PLATELET
BASOS ABS: 0 10*3/uL (ref 0.0–0.2)
Basos: 1 %
EOS (ABSOLUTE): 0.1 10*3/uL (ref 0.0–0.4)
Eos: 2 %
HEMOGLOBIN: 13 g/dL (ref 13.0–17.7)
Hematocrit: 39.6 % (ref 37.5–51.0)
Immature Grans (Abs): 0 10*3/uL (ref 0.0–0.1)
Immature Granulocytes: 0 %
LYMPHS ABS: 0.8 10*3/uL (ref 0.7–3.1)
LYMPHS: 15 %
MCH: 29 pg (ref 26.6–33.0)
MCHC: 32.8 g/dL (ref 31.5–35.7)
MCV: 88 fL (ref 79–97)
MONOCYTES: 7 %
Monocytes Absolute: 0.4 10*3/uL (ref 0.1–0.9)
Neutrophils Absolute: 4.1 10*3/uL (ref 1.4–7.0)
Neutrophils: 75 %
PLATELETS: 148 10*3/uL — AB (ref 150–379)
RBC: 4.48 x10E6/uL (ref 4.14–5.80)
RDW: 14.6 % (ref 12.3–15.4)
WBC: 5.4 10*3/uL (ref 3.4–10.8)

## 2017-03-11 LAB — HEPATIC FUNCTION PANEL
ALBUMIN: 4.6 g/dL (ref 3.5–4.8)
ALK PHOS: 45 IU/L (ref 39–117)
ALT: 15 IU/L (ref 0–44)
AST: 19 IU/L (ref 0–40)
Bilirubin Total: 0.4 mg/dL (ref 0.0–1.2)
Bilirubin, Direct: 0.12 mg/dL (ref 0.00–0.40)
TOTAL PROTEIN: 6.9 g/dL (ref 6.0–8.5)

## 2017-03-11 LAB — LIPID PANEL
Chol/HDL Ratio: 3.3 ratio (ref 0.0–5.0)
Cholesterol, Total: 125 mg/dL (ref 100–199)
HDL: 38 mg/dL — AB (ref >39)
LDL CALC: 59 mg/dL (ref 0–99)
TRIGLYCERIDES: 138 mg/dL (ref 0–149)
VLDL CHOLESTEROL CAL: 28 mg/dL (ref 5–40)

## 2017-03-11 LAB — VITAMIN D 25 HYDROXY (VIT D DEFICIENCY, FRACTURES): Vit D, 25-Hydroxy: 52.9 ng/mL (ref 30.0–100.0)

## 2017-03-11 LAB — BMP8+EGFR
BUN / CREAT RATIO: 9 — AB (ref 10–24)
BUN: 7 mg/dL — AB (ref 8–27)
CALCIUM: 9 mg/dL (ref 8.6–10.2)
CO2: 25 mmol/L (ref 18–29)
Chloride: 105 mmol/L (ref 96–106)
Creatinine, Ser: 0.78 mg/dL (ref 0.76–1.27)
GFR calc Af Amer: 105 mL/min/{1.73_m2} (ref >59)
GFR calc non Af Amer: 91 mL/min/{1.73_m2} (ref >59)
GLUCOSE: 88 mg/dL (ref 65–99)
Potassium: 4 mmol/L (ref 3.5–5.2)
Sodium: 144 mmol/L (ref 134–144)

## 2017-03-20 ENCOUNTER — Ambulatory Visit (INDEPENDENT_AMBULATORY_CARE_PROVIDER_SITE_OTHER): Payer: Medicare Other | Admitting: Cardiovascular Disease

## 2017-03-20 ENCOUNTER — Encounter: Payer: Self-pay | Admitting: Cardiovascular Disease

## 2017-03-20 VITALS — BP 136/60 | HR 66 | Ht 65.0 in | Wt 155.8 lb

## 2017-03-20 DIAGNOSIS — I6523 Occlusion and stenosis of bilateral carotid arteries: Secondary | ICD-10-CM | POA: Diagnosis not present

## 2017-03-20 DIAGNOSIS — I25119 Atherosclerotic heart disease of native coronary artery with unspecified angina pectoris: Secondary | ICD-10-CM

## 2017-03-20 DIAGNOSIS — I209 Angina pectoris, unspecified: Secondary | ICD-10-CM | POA: Diagnosis not present

## 2017-03-20 DIAGNOSIS — M79606 Pain in leg, unspecified: Secondary | ICD-10-CM | POA: Diagnosis not present

## 2017-03-20 DIAGNOSIS — R209 Unspecified disturbances of skin sensation: Secondary | ICD-10-CM

## 2017-03-20 NOTE — Progress Notes (Signed)
Cardiology Office Note Date:  03/20/2017   ID:  [name], DOB 11/16/1945, MRN [id number]  PCP:  Chipper Herb, MD  Cardiologist:  [name], MD    Chief Complaint  Patient presents with   Follow-up    4 month     History of Present Illness: [name] is a 71 y.o. male who presents for Follow-up of coronary artery disease. The patient has extensive CAD with history of CABG. Other comorbid conditions include carotid stenosis status post right carotid endarterectomy, hypertension, and hyperlipidemia. The patient has undergone multiple PCI procedures. He's had vein graft interventions in 2012 and in January 2018.  The patient has had about 7 episodes of angina since he was last seen. Symptoms have resolved with nitroglycerin. He describes substernal chest pressure. Overall symptoms are much better since he underwent PCI 6 months ago. He denies shortness of breath, edema, or heart palpitations. He was recently noted to have an abnormal pulse exam and was concerned about the presence of vascular disease. He complains of cold feet as well as bilateral calf pain with physical activity. He has not had any ulcers or nonhealing wounds on the feet. He has not had any resting pain in his legs.  Past Medical History:  Diagnosis Date   Anxiety    Dr. Corrinne Eagle     ASCVD (arteriosclerotic cardiovascular disease)    CAD (coronary artery disease)    Dr. Verl Blalock  - Cardiologist    CHF (congestive heart failure) ([place])    Dementia    History of radiation therapy 11/15/08-02/23/09   RUL 66Gy/43f   Hyperlipidemia    Hypertension    [place]    lung ca dx 1/10   Neuropathic pain    OA (osteoarthritis)    PVD (peripheral vascular disease) [place]    carotid artery disease   Radiation May 2010   Right upper lobe lung/Adenocarcinoma    Past Surgical History:  Procedure Laterality Date   CARDIAC CATHETERIZATION  12/20/04   recurrent    CARDIAC CATHETERIZATION  N/A 11/04/2016   Procedure: Left Heart Cath and Cors/Grafts Angiography;  Surgeon: MSherren Mocha MD;  [facility];  Service: Cardiovascular;  Laterality: N/A;   CARDIAC CATHETERIZATION N/A 11/04/2016   Procedure: Intravascular Pressure Wire/FFR Study-seq SVG-OM3, PLA;  Surgeon: MSherren Mocha MD;  [facility];  Service: Cardiovascular;  Laterality: N/A;   CARDIAC CATHETERIZATION N/A 11/04/2016   Procedure: Coronary Stent Intervention;  Surgeon: MSherren Mocha MD;  [facility];  Service: Cardiovascular;  Laterality: N/A;   CAROTID ENDARTERECTOMY  12/98   right ([place])   CORONARY ARTERY BYPASS GRAFT  7/95   x5 ([place] )   LEFT HEART CATHETERIZATION WITH CORONARY ANGIOGRAM N/A 12/16/2013   Procedure: LEFT HEART CATHETERIZATION WITH CORONARY ANGIOGRAM;  Surgeon: MBlane Ohara MD;  [facility];  Service: Cardiovascular;  Laterality: N/A;   LUNG CANCER SURGERY     had ratiation of right lung   PERIPHERAL VASCULAR CATHETERIZATION Right 10/30/2015   Procedure: Carotid PTA/Stent Intervention;  Surgeon: JLorretta Harp MD;  [facility];  Service: Cardiovascular;  Laterality: Right;    Current Outpatient Prescriptions  Medication Sig Dispense Refill   acetaminophen (TYLENOL) 500 MG tablet Take 500-1,000 mg by mouth 2 (two) times daily as needed for moderate pain.     ALPRAZolam (XANAX) 1 MG tablet TAKE 1 TABLET BY MOUTH THREE TIMES A DAY AS NEEDED  FOR ANXIETY 90 tablet 0   amLODipine (NORVASC) 10 MG tablet TAKE 1 TABLET (10 MG TOTAL) BY MOUTH DAILY. 30 tablet 6   aspirin EC 81 MG tablet Take 1 tablet (81 mg total) by mouth daily. 30 tablet 4   cholecalciferol (VITAMIN D) 1000 units tablet Take 1,000 Units by mouth daily.     fenofibrate 160 MG tablet TAKE 1 TABLET (160 MG TOTAL) BY MOUTH DAILY. 30 tablet 0   fluticasone (FLONASE) 50 MCG/ACT nasal spray PLACE 2 SPRAYS INTO BOTH NOSTRILS DAILY. 16 g 3    gabapentin (NEURONTIN) 300 MG capsule TAKE ONE CAPSULE BY MOUTH EVERY 8 HOURS 270 capsule 1   hydrochlorothiazide (MICROZIDE) 12.5 MG capsule Take 12.5 mg by mouth 2 (two) times daily.     HYDROcodone-acetaminophen (NORCO) 10-325 MG tablet Take 1 tablet by mouth every 6 (six) hours as needed (pain). 120 tablet 0   isosorbide mononitrate (IMDUR) 60 MG 24 hr tablet Take 1 tablet (60 mg total) by mouth daily. NEEDS APPOINTMENT FOR FUTURE REFILLS OR 90-DAY SUPPLY 30 tablet 0   metoprolol (LOPRESSOR) 50 MG tablet Take 1 tablet (50 mg total) by mouth 2 (two) times daily. 180 tablet 3   Multiple Vitamin (MULTIVITAMIN) capsule Take 1 capsule by mouth daily.       nitroGLYCERIN (NITROSTAT) 0.4 MG SL tablet Place 1 tablet (0.4 mg total) under the tongue every 5 (five) minutes as needed for chest pain. 25 tablet 5   Polyvinyl Alcohol (LUBRICANT DROPS OP) Apply 1 drop to eye daily as needed (dry eyes).     potassium chloride SA (KLOR-CON M20) 20 MEQ tablet Take 1 tablet (20 mEq total) by mouth daily. 30 tablet 11   prasugrel (EFFIENT) 10 MG TABS tablet Take 10 mg by mouth daily. Reported on 10/25/2015     rosuvastatin (CRESTOR) 40 MG tablet TAKE 1 TABLET BY MOUTH EVERY DAY 90 tablet 0   Current Facility-Administered Medications  Medication Dose Route Frequency Provider Last Rate Last Dose   cyanocobalamin ((VITAMIN B-12)) injection 1,000 mcg  1,000 mcg Intramuscular Q30 days Chipper Herb, MD   1,000 mcg at 02/26/17 0840    Allergies:   Penicillins; Clopidogrel bisulfate; Demerol; and Propoxyphene n-acetaminophen   Social History:  The patient  reports that he has quit smoking. He has quit using smokeless tobacco. He reports that he does not drink alcohol or use drugs.   Family History:  The patient's  family history includes Coronary artery disease in his other; Heart attack in his mother; Heart disease in his brother, father, and sister.    ROS:  Please see the history of present illness.   All other systems are reviewed and negative.    PHYSICAL EXAM: VS:  BP 136/60    Pulse 66    Ht 5' 5"  (1.651 m)    Wt 155 lb 12.8 oz (70.7 kg)    BMI 25.93 kg/m  , BMI Body mass index is 25.93 kg/m. GEN: Well nourished, well developed, in no acute distress  HEENT: normal  Neck: no JVD, no masses. Prominent bilateral carotid bruits Cardiac: RRR without murmur or gallop                Respiratory:  clear to auscultation bilaterally, normal work of breathing GI: soft, nontender, nondistended, + BS MS: no deformity or atrophy  Ext: no pretibial edema, PT/DP pulses 2+= bilaterally, femoral pulses 1+ bilaterally Skin: warm and dry, no rash Neuro:  Strength and sensation are intact  Psych: euthymic mood, full affect  EKG:  EKG is not ordered today.  Recent Labs: 03/10/2017: ALT 15; BUN 7; Creatinine, Ser 0.78; Hemoglobin 13.0; Platelets 148; Potassium 4.0; Sodium 144   Lipid Panel     Component Value Date/Time   CHOL 125 03/10/2017 1151   CHOL 127 03/17/2013 0928   TRIG 138 03/10/2017 1151   TRIG 126 05/19/2016 1011   TRIG 148 03/17/2013 0928   HDL 38 (L) 03/10/2017 1151   HDL 32 (L) 05/19/2016 1011   HDL 34 (L) 03/17/2013 0928   CHOLHDL 3.3 03/10/2017 1151   CHOLHDL 3.3 11/16/2010 0400   VLDL 27 11/16/2010 0400   LDLCALC 59 03/10/2017 1151   LDLCALC 72 07/17/2014 0943   LDLCALC 63 03/17/2013 0928      Wt Readings from Last 3 Encounters:  03/20/17 155 lb 12.8 oz (70.7 kg)  03/10/17 154 lb (69.9 kg)  12/04/16 156 lb (70.8 kg)     Cardiac Studies Reviewed: Echo 08/02/2015: Study Conclusions  - Left ventricle: The cavity size was normal. Systolic function was   normal. The estimated ejection fraction was in the range of 50%   to 55%. Mild diffuse hypokinesis. Features are consistent with a   pseudonormal left ventricular filling pattern, with concomitant   abnormal relaxation and increased filling pressure (grade 2   diastolic dysfunction). Doppler parameters are  consistent with   elevated ventricular end-diastolic filling pressure. - Aortic valve: Transvalvular velocity was within the normal range.   There was no stenosis. There was no regurgitation. - Mitral valve: There was trivial regurgitation. - Right ventricle: The cavity size was normal. Wall thickness was   normal. Systolic function was normal. - Inferior vena cava: The vessel was normal in size. The   respirophasic diameter changes were in the normal range (>= 50%),   consistent with normal central venous pressure.  Cardiac Cath 11/04/2016: Conclusion   1. Severe native vessel CAD with patency of a small nondominant RCA and chronic occlusion of the left coronary artery.  2. Mild contraction abnormality of the LV with preserved LVEF 3. S/P CABG with continued patency of the LIMA-LAD, chronic occlusion of the SVG-OM1 and OM2, and patency of the SVG-OM3/Left PDA.  4. Severe stenosis of the SVG-PDA/OM3 documented by FFR, treated successfully with a DES (3.5x16 mm Promus DES)  Continue home medical therapy. Anticipate DC home tomorrow.    Indications   Coronary artery disease with exertional angina (HCC) [Y56.389 (ICD-10-CM)]  Procedural Details/Technique   Technical Details INDICATION: Severe exertional angina/rest angina, recurrent symptoms. Pt with prior CABG and multiple PCI's, last cath in 2015, presents with worsening of his angina despite aggressive anti-anginal medical therapy.   PROCEDURAL DETAILS: The left wrist was prepped, draped, and anesthetized with 1% lidocaine. Using the modified Seldinger technique, a 5/6 French Slender sheath was introduced into the right radial artery. 3 mg of verapamil was administered through the sheath, weight-based unfractionated heparin was administered intravenously. Standard Judkins catheters were used for selective coronary angiography and left ventriculography. A JR4 is used for LIMA angiography. The left coronary is known to be occluded at the  left main. An AL-2 is used for the SVG angiography. Catheter exchanges were performed over an exchange length guidewire. PCI is performed after the diagnostic procedure. There were no immediate procedural complications. A TR band was used for radial hemostasis at the completion of the procedure. The patient was transferred to the post catheterization recovery area for further monitoring.    Estimated  blood loss <50 mL.  During this procedure the patient was administered the following to achieve and maintain moderate conscious sedation: Versed 6 mg, Fentanyl 125 mcg, while the patient's heart rate, blood pressure, and oxygen saturation were continuously monitored. The period of conscious sedation was 74 minutes, of which I was present face-to-face 100% of this time.    Coronary Findings   Dominance: Left  Left Main  Ost LM lesion, 100% stenosed. The lesion is calcified.  Right Coronary Artery  Vessel is small.  Mid RCA lesion, 40% stenosed.  Graft Angiography  Free LIMA Graft to Dist LAD  LIMA graft was visualized by angiography and is normal in caliber and anatomically normal. Patent LIMA-LAD  Sequential jump graft Graft to 3rd Mrg, LPDA  Seq SVG-. Stents in the proximal and mid-body of the graft are patent. There is a 60% eccentric stenosis in the distal body of the graft.  Mid Graft to Dist Graft lesion before 3rd Mrg, 0% stenosed. Mid Graft to Dist Graft lesion before 3rd Mrg with no stenosis was previously treated.  Dist Graft lesion before 3rd Mrg, 60% stenosed. The lesion is eccentric. The lesion is calcified.  Angioplasty: A drug eluting stent was successfully placed. The pre-interventional distal flow is normal (TIMI 3). The post-interventional distal flow is normal (TIMI 3). The intervention was successful . No complications occurred at this lesion. Pressure wire/FFR was performed on the lesion . An AL-1 guide is used. Heparin is used for anticoagulation. A therapeutic ACT is  achieved. The lesion is crossed with a Buyer, retail. A Navvus FFR wire is advanced beyond the lesion in the SVG. The baseline FFR is 0.84. The FFR with IV adenosine is 0.74. The lesion is stented with a 3.5x16 mm Promus DES and the stent is post-dilated with a 3.75 mm Pharr balloon to 14 atm. Post-PCI FFR is 1.0 at baseline. Adenosine is not given again.  There is no residual stenosis post intervention.  Sequential jump graft Graft to 1st Mrg, 2nd Mrg  Seq SVG-. Chronically occluded graft sequenced to OM1 and OM2  Prox Graft lesion before 1st Mrg, 100% stenosed.  Wall Motion              Left Heart   Left Ventricle The left ventricular size is normal. The left ventricular systolic function is normal. LV end diastolic pressure is mildly elevated. The left ventricular ejection fraction is 55-65% by visual estimate. There are LV function abnormalities due to segmental dysfunction. Mild hypokinesis of the anterolateral wall, overall preserved LVEF with estimated LVEF 60%.    Coronary Diagrams   Diagnostic Diagram       Post-Intervention Diagram       Implants     Permanent Stent  Stent Promus Prem Mr 3.5x16 - [id number] - Implanted    Inventory item: Stent Promus Prem Mr 3.5x16 Model/Cat number: [id number]  Manufacturer: [employer] Lot number: [id number]  Device identifier: [id number] Device identifier type: GS1  GUDID Information   Request status Successful    Brand name: Promus PREMIER Version/Model: [id number]  Company name: [employer] MRI safety info as of 11/04/16: MR Conditional  Contains dry or latex rubber: No    GMDN P.T. name: Coronary angioplasty balloon catheter, basic    As of 11/04/2016   Status: Implanted      PACS Images   Show images for Cardiac catheterization   Link to Procedure Log   Procedure Log    Hemo Data  Most Recent Value  AO Systolic Pressure 818 mmHg  AO Diastolic Pressure 51 mmHg  AO Mean 90 mmHg   LV Systolic Pressure 563 mmHg  LV Diastolic Pressure 5 mmHg  LV EDP 27 mmHg  Arterial Occlusion Pressure Extended Systolic Pressure 149 mmHg  Arterial Occlusion Pressure Extended Diastolic Pressure 67 mmHg  Arterial Occlusion Pressure Extended Mean Pressure 115 mmHg  Left Ventricular Apex Extended Systolic Pressure 702 mmHg  Left Ventricular Apex Extended Diastolic Pressure 8 mmHg  Left Ventricular Apex Extended EDP Pressure 29 mmHg   Carotid ultrasound 09/10/2016: Heterogeneous plaque, bilaterally. Stable 60-79% RICA stenosis, s/p CEA. Chronically occluded LICA. >63% LECA stenosis. Patent vertebral arteries with antegrade flow, bilaterally. Patent right subclavian artery with normal flow. Elevated left subclavian artery velocities. 1 year follow-up   ASSESSMENT AND PLAN: 1.  CAD, native vessel, with angina: The patient appears stable on his current regimen. He's had significant improvement since undergoing PCI in January. He remains on amlodipine, metoprolol, and isosorbide. He continues on dual antiplatelet therapy with aspirin and Effient.  2. HTN: BP controlled on current Rx. Will continue the same  3. Hyperlipidemia: Treated with fenofibrate and Crestor  4. Carotid stenosis s/p CEA: Doppler studies are followed annually. Last doppler from 09/2016 reviewed.   5. Leg pain: He does have diminished femoral pulses but his pedal pulses are palpable. With bilateral calf pain, will check ABIs.  Current medicines are reviewed with the patient today.  The patient does not have concerns regarding medicines.  Labs/ tests ordered today include:  No orders of the defined types were placed in this encounter.  Disposition:   FU 6 months with an APP and one year with me  Signed, [name], MD  03/20/2017 11:38 AM    [facility] [place], [place], [place]  [address] Phone: [phone]; Fax: [phone]

## 2017-03-20 NOTE — Patient Instructions (Signed)
Medication Instructions:  Your physician recommends that you continue on your current medications as directed. Please refer to the Current Medication list given to you today.  Labwork: No new orders.   Testing/Procedures: Your physician has requested that you have an ankle brachial index (ABI). During this test an ultrasound and blood pressure cuff are used to evaluate the arteries that supply the arms and legs with blood. Allow thirty minutes for this exam. There are no restrictions or special instructions.  Follow-Up: Your physician recommends that you schedule a follow-up appointment in: 6 MONTHS with PA/NP  Your physician wants you to follow-up in: 1 YEAR with Dr Burt Knack. You will receive a reminder letter in the mail two months in advance. If you don't receive a letter, please call our office to schedule the follow-up appointment.   Any Other Special Instructions Will Be Listed Below (If Applicable).     If you need a refill on your cardiac medications before your next appointment, please call your pharmacy.

## 2017-03-26 ENCOUNTER — Ambulatory Visit (HOSPITAL_COMMUNITY)
Admission: RE | Admit: 2017-03-26 | Discharge: 2017-03-26 | Disposition: A | Discharge status: Home / Self Care | Payer: Medicare Other | Source: Ambulatory Visit | Attending: Cardiology | Admitting: Cardiology

## 2017-03-26 DIAGNOSIS — R209 Unspecified disturbances of skin sensation: Secondary | ICD-10-CM | POA: Insufficient documentation

## 2017-03-26 DIAGNOSIS — I739 Peripheral vascular disease, unspecified: Secondary | ICD-10-CM | POA: Diagnosis not present

## 2017-03-26 DIAGNOSIS — M79606 Pain in leg, unspecified: Secondary | ICD-10-CM | POA: Insufficient documentation

## 2017-03-26 DIAGNOSIS — R9439 Abnormal result of other cardiovascular function study: Secondary | ICD-10-CM | POA: Diagnosis not present

## 2017-03-30 ENCOUNTER — Ambulatory Visit (INDEPENDENT_AMBULATORY_CARE_PROVIDER_SITE_OTHER): Payer: Medicare Other | Admitting: *Deleted

## 2017-03-30 DIAGNOSIS — F419 Anxiety disorder, unspecified: Secondary | ICD-10-CM

## 2017-03-30 DIAGNOSIS — E538 Deficiency of other specified B group vitamins: Secondary | ICD-10-CM | POA: Diagnosis not present

## 2017-03-30 MED ORDER — ALPRAZOLAM 1 MG PO TABS: ORAL_TABLET | ORAL | 1 refills | Status: AC

## 2017-03-31 ENCOUNTER — Inpatient Hospital Stay (HOSPITAL_COMMUNITY)
Admission: EM | Admit: 2017-03-31 | Discharge: 2017-04-05 | DRG: 871 | Disposition: E | Payer: Medicare Other | Attending: Pulmonary Disease | Admitting: Pulmonary Disease

## 2017-03-31 ENCOUNTER — Emergency Department (HOSPITAL_COMMUNITY): Payer: Medicare Other

## 2017-03-31 ENCOUNTER — Inpatient Hospital Stay (HOSPITAL_COMMUNITY): Payer: Medicare Other

## 2017-03-31 ENCOUNTER — Encounter (HOSPITAL_COMMUNITY): Payer: Self-pay | Admitting: *Deleted

## 2017-03-31 DIAGNOSIS — I214 Non-ST elevation (NSTEMI) myocardial infarction: Secondary | ICD-10-CM | POA: Diagnosis present

## 2017-03-31 DIAGNOSIS — G9341 Metabolic encephalopathy: Secondary | ICD-10-CM | POA: Diagnosis present

## 2017-03-31 DIAGNOSIS — E871 Hypo-osmolality and hyponatremia: Secondary | ICD-10-CM | POA: Diagnosis present

## 2017-03-31 DIAGNOSIS — Z66 Do not resuscitate: Secondary | ICD-10-CM | POA: Diagnosis present

## 2017-03-31 DIAGNOSIS — Z951 Presence of aortocoronary bypass graft: Secondary | ICD-10-CM | POA: Diagnosis not present

## 2017-03-31 DIAGNOSIS — E872 Acidosis: Secondary | ICD-10-CM | POA: Diagnosis present

## 2017-03-31 DIAGNOSIS — J9601 Acute respiratory failure with hypoxia: Secondary | ICD-10-CM | POA: Diagnosis not present

## 2017-03-31 DIAGNOSIS — I739 Peripheral vascular disease, unspecified: Secondary | ICD-10-CM | POA: Diagnosis present

## 2017-03-31 DIAGNOSIS — Z888 Allergy status to other drugs, medicaments and biological substances status: Secondary | ICD-10-CM

## 2017-03-31 DIAGNOSIS — N179 Acute kidney failure, unspecified: Secondary | ICD-10-CM | POA: Diagnosis not present

## 2017-03-31 DIAGNOSIS — I469 Cardiac arrest, cause unspecified: Secondary | ICD-10-CM | POA: Diagnosis not present

## 2017-03-31 DIAGNOSIS — G931 Anoxic brain damage, not elsewhere classified: Secondary | ICD-10-CM | POA: Diagnosis present

## 2017-03-31 DIAGNOSIS — R05 Cough: Secondary | ICD-10-CM | POA: Diagnosis not present

## 2017-03-31 DIAGNOSIS — R6521 Severe sepsis with septic shock: Secondary | ICD-10-CM | POA: Diagnosis present

## 2017-03-31 DIAGNOSIS — Z923 Personal history of irradiation: Secondary | ICD-10-CM | POA: Diagnosis not present

## 2017-03-31 DIAGNOSIS — J810 Acute pulmonary edema: Secondary | ICD-10-CM | POA: Diagnosis not present

## 2017-03-31 DIAGNOSIS — Z87891 Personal history of nicotine dependence: Secondary | ICD-10-CM | POA: Diagnosis not present

## 2017-03-31 DIAGNOSIS — Z85118 Personal history of other malignant neoplasm of bronchus and lung: Secondary | ICD-10-CM

## 2017-03-31 DIAGNOSIS — I2510 Atherosclerotic heart disease of native coronary artery without angina pectoris: Secondary | ICD-10-CM | POA: Diagnosis present

## 2017-03-31 DIAGNOSIS — R4020 Unspecified coma: Secondary | ICD-10-CM | POA: Diagnosis present

## 2017-03-31 DIAGNOSIS — Z880 Allergy status to penicillin: Secondary | ICD-10-CM

## 2017-03-31 DIAGNOSIS — I5032 Chronic diastolic (congestive) heart failure: Secondary | ICD-10-CM | POA: Diagnosis present

## 2017-03-31 DIAGNOSIS — Z902 Acquired absence of lung [part of]: Secondary | ICD-10-CM

## 2017-03-31 DIAGNOSIS — E785 Hyperlipidemia, unspecified: Secondary | ICD-10-CM | POA: Diagnosis present

## 2017-03-31 DIAGNOSIS — A419 Sepsis, unspecified organism: Secondary | ICD-10-CM | POA: Diagnosis not present

## 2017-03-31 DIAGNOSIS — I110 Hypertensive heart disease with heart failure: Secondary | ICD-10-CM | POA: Diagnosis present

## 2017-03-31 DIAGNOSIS — R570 Cardiogenic shock: Secondary | ICD-10-CM

## 2017-03-31 DIAGNOSIS — J189 Pneumonia, unspecified organism: Secondary | ICD-10-CM | POA: Diagnosis present

## 2017-03-31 DIAGNOSIS — I10 Essential (primary) hypertension: Secondary | ICD-10-CM | POA: Diagnosis not present

## 2017-03-31 DIAGNOSIS — F0390 Unspecified dementia without behavioral disturbance: Secondary | ICD-10-CM | POA: Diagnosis present

## 2017-03-31 DIAGNOSIS — J9600 Acute respiratory failure, unspecified whether with hypoxia or hypercapnia: Secondary | ICD-10-CM | POA: Diagnosis present

## 2017-03-31 DIAGNOSIS — M1990 Unspecified osteoarthritis, unspecified site: Secondary | ICD-10-CM | POA: Diagnosis present

## 2017-03-31 DIAGNOSIS — Z8249 Family history of ischemic heart disease and other diseases of the circulatory system: Secondary | ICD-10-CM

## 2017-03-31 DIAGNOSIS — R0602 Shortness of breath: Secondary | ICD-10-CM | POA: Diagnosis not present

## 2017-03-31 DIAGNOSIS — R0682 Tachypnea, not elsewhere classified: Secondary | ICD-10-CM | POA: Diagnosis not present

## 2017-03-31 LAB — BASIC METABOLIC PANEL
Anion gap: 18 — ABNORMAL HIGH (ref 5–15)
BUN: 51 mg/dL — AB (ref 6–20)
CALCIUM: 6.7 mg/dL — AB (ref 8.9–10.3)
CHLORIDE: 101 mmol/L (ref 101–111)
CO2: 16 mmol/L — AB (ref 22–32)
CREATININE: 3.76 mg/dL — AB (ref 0.61–1.24)
GFR calc Af Amer: 17 mL/min — ABNORMAL LOW (ref >60)
GFR calc non Af Amer: 15 mL/min — ABNORMAL LOW (ref >60)
GLUCOSE: 139 mg/dL — AB (ref 65–99)
Potassium: 4.4 mmol/L (ref 3.5–5.1)
Sodium: 135 mmol/L (ref 135–145)

## 2017-03-31 LAB — URINALYSIS, ROUTINE W REFLEX MICROSCOPIC
Bilirubin Urine: NEGATIVE
GLUCOSE, UA: NEGATIVE mg/dL
KETONES UR: NEGATIVE mg/dL
LEUKOCYTES UA: NEGATIVE
Nitrite: NEGATIVE
PROTEIN: 100 mg/dL — AB
Specific Gravity, Urine: 1.025 (ref 1.005–1.030)
pH: 5 (ref 5.0–8.0)

## 2017-03-31 LAB — COMPREHENSIVE METABOLIC PANEL
ALT: 22 U/L (ref 17–63)
AST: 65 U/L — ABNORMAL HIGH (ref 15–41)
Albumin: 3.4 g/dL — ABNORMAL LOW (ref 3.5–5.0)
Alkaline Phosphatase: 35 U/L — ABNORMAL LOW (ref 38–126)
Anion gap: 16 — ABNORMAL HIGH (ref 5–15)
BUN: 52 mg/dL — ABNORMAL HIGH (ref 6–20)
CO2: 17 mmol/L — ABNORMAL LOW (ref 22–32)
Calcium: 7.7 mg/dL — ABNORMAL LOW (ref 8.9–10.3)
Chloride: 96 mmol/L — ABNORMAL LOW (ref 101–111)
Creatinine, Ser: 3.87 mg/dL — ABNORMAL HIGH (ref 0.61–1.24)
GFR calc Af Amer: 17 mL/min — ABNORMAL LOW (ref >60)
GFR calc non Af Amer: 14 mL/min — ABNORMAL LOW (ref >60)
Glucose, Bld: 102 mg/dL — ABNORMAL HIGH (ref 65–99)
Potassium: 3.6 mmol/L (ref 3.5–5.1)
Sodium: 129 mmol/L — ABNORMAL LOW (ref 135–145)
Total Bilirubin: 1.5 mg/dL — ABNORMAL HIGH (ref 0.3–1.2)
Total Protein: 6.5 g/dL (ref 6.5–8.1)

## 2017-03-31 LAB — CBC WITH DIFFERENTIAL/PLATELET
Basophils Absolute: 0 10*3/uL (ref 0.0–0.1)
Basophils Relative: 1 %
Eosinophils Absolute: 0 10*3/uL (ref 0.0–0.7)
Eosinophils Relative: 0 %
HCT: 38.9 % — ABNORMAL LOW (ref 39.0–52.0)
Hemoglobin: 13 g/dL (ref 13.0–17.0)
Lymphocytes Relative: 16 %
Lymphs Abs: 0.6 10*3/uL — ABNORMAL LOW (ref 0.7–4.0)
MCH: 29.3 pg (ref 26.0–34.0)
MCHC: 33.4 g/dL (ref 30.0–36.0)
MCV: 87.6 fL (ref 78.0–100.0)
Monocytes Absolute: 0.1 10*3/uL (ref 0.1–1.0)
Monocytes Relative: 4 %
Neutro Abs: 2.9 10*3/uL (ref 1.7–7.7)
Neutrophils Relative %: 79 %
Platelets: 94 10*3/uL — ABNORMAL LOW (ref 150–400)
RBC: 4.44 MIL/uL (ref 4.22–5.81)
RDW: 15.4 % (ref 11.5–15.5)
WBC Morphology: INCREASED
WBC: 3.6 10*3/uL — ABNORMAL LOW (ref 4.0–10.5)

## 2017-03-31 LAB — GLUCOSE, CAPILLARY
GLUCOSE-CAPILLARY: 122 mg/dL — AB (ref 65–99)
GLUCOSE-CAPILLARY: 132 mg/dL — AB (ref 65–99)

## 2017-03-31 LAB — I-STAT ARTERIAL BLOOD GAS, ED
Acid-base deficit: 17 mmol/L — ABNORMAL HIGH (ref 0.0–2.0)
Bicarbonate: 12.6 mmol/L — ABNORMAL LOW (ref 20.0–28.0)
O2 Saturation: 85 %
Patient temperature: 97.2
TCO2: 14 mmol/L (ref 0–100)
pCO2 arterial: 40.7 mmHg (ref 32.0–48.0)
pH, Arterial: 7.095 — CL (ref 7.350–7.450)
pO2, Arterial: 64 mmHg — ABNORMAL LOW (ref 83.0–108.0)

## 2017-03-31 LAB — MAGNESIUM: Magnesium: 1.4 mg/dL — ABNORMAL LOW (ref 1.7–2.4)

## 2017-03-31 LAB — PROCALCITONIN: Procalcitonin: 71.21 ng/mL

## 2017-03-31 LAB — I-STAT CG4 LACTIC ACID, ED: LACTIC ACID, VENOUS: 5.3 mmol/L — AB (ref 0.5–1.9)

## 2017-03-31 LAB — MRSA PCR SCREENING: MRSA BY PCR: NEGATIVE

## 2017-03-31 LAB — BRAIN NATRIURETIC PEPTIDE: B Natriuretic Peptide: 424 pg/mL — ABNORMAL HIGH (ref 0.0–100.0)

## 2017-03-31 LAB — PROTIME-INR
INR: 2.36
Prothrombin Time: 26.3 seconds — ABNORMAL HIGH (ref 11.4–15.2)

## 2017-03-31 LAB — TROPONIN I: Troponin I: 3.98 ng/mL (ref <0.03)

## 2017-03-31 LAB — APTT

## 2017-03-31 MED ORDER — SODIUM CHLORIDE 0.9 % IV SOLN
250.0000 mL | INTRAVENOUS | Status: DC | PRN
Start: 2017-03-31 — End: 2017-04-01

## 2017-03-31 MED ORDER — SODIUM CHLORIDE 0.9 % IV SOLN
1.0000 g | Freq: Once | INTRAVENOUS | Status: AC
  Administered 2017-03-31: 1 g via INTRAVENOUS
  Filled 2017-03-31: qty 10

## 2017-03-31 MED ORDER — FENTANYL CITRATE (PF) 100 MCG/2ML IJ SOLN: 100.0000 ug | Freq: Once | INTRAMUSCULAR | Status: DC

## 2017-03-31 MED ORDER — CHLORHEXIDINE GLUCONATE 0.12% ORAL RINSE (MEDLINE KIT)
15.0000 mL | Freq: Two times a day (BID) | OROMUCOSAL | Status: DC
  Administered 2017-03-31: 15 mL via OROMUCOSAL

## 2017-03-31 MED ORDER — SODIUM BICARBONATE 8.4 % IV SOLN
INTRAVENOUS | Status: DC
  Administered 2017-03-31: 16:00:00 via INTRAVENOUS
  Filled 2017-03-31 (×3): qty 150

## 2017-03-31 MED ORDER — VANCOMYCIN HCL IN DEXTROSE 1-5 GM/200ML-% IV SOLN: 1000.0000 mg | Freq: Once | INTRAVENOUS | Status: DC

## 2017-03-31 MED ORDER — PRASUGREL HCL 10 MG PO TABS: 10.0000 mg | ORAL_TABLET | Freq: Every day | ORAL | Status: DC

## 2017-03-31 MED ORDER — ROCURONIUM BROMIDE 50 MG/5ML IV SOLN
INTRAVENOUS | Status: AC | PRN
  Administered 2017-03-31: 80 mg via INTRAVENOUS

## 2017-03-31 MED ORDER — HEPARIN (PORCINE) IN NACL 100-0.45 UNIT/ML-% IJ SOLN
850.0000 [IU]/h | INTRAMUSCULAR | Status: DC
  Administered 2017-03-31: 850 [IU]/h via INTRAVENOUS
  Filled 2017-03-31: qty 250

## 2017-03-31 MED ORDER — PROPOFOL 1000 MG/100ML IV EMUL
INTRAVENOUS | Status: AC
  Filled 2017-03-31: qty 100

## 2017-03-31 MED ORDER — HEPARIN SODIUM (PORCINE) 5000 UNIT/ML IJ SOLN: 5000.0000 [IU] | Freq: Three times a day (TID) | INTRAMUSCULAR | Status: DC

## 2017-03-31 MED ORDER — SODIUM CHLORIDE 0.9 % IV BOLUS (SEPSIS)
1000.0000 mL | Freq: Once | INTRAVENOUS | Status: AC
  Administered 2017-03-31: 1000 mL via INTRAVENOUS

## 2017-03-31 MED ORDER — SODIUM CHLORIDE 0.9 % IV SOLN
1.0000 g | Freq: Two times a day (BID) | INTRAVENOUS | Status: DC
  Filled 2017-03-31: qty 1

## 2017-03-31 MED ORDER — ASPIRIN 81 MG PO CHEW: 324.0000 mg | CHEWABLE_TABLET | ORAL | Status: DC

## 2017-03-31 MED ORDER — NOREPINEPHRINE BITARTRATE 1 MG/ML IV SOLN
0.0000 ug/min | INTRAVENOUS | Status: DC
  Administered 2017-03-31: 25 ug/min via INTRAVENOUS
  Administered 2017-03-31: 20 ug/min via INTRAVENOUS
  Filled 2017-03-31 (×2): qty 4

## 2017-03-31 MED ORDER — LEVOFLOXACIN IN D5W 750 MG/150ML IV SOLN
750.0000 mg | Freq: Once | INTRAVENOUS | Status: AC
  Administered 2017-03-31: 750 mg via INTRAVENOUS
  Filled 2017-03-31: qty 150

## 2017-03-31 MED ORDER — MAGNESIUM SULFATE 2 GM/50ML IV SOLN
2.0000 g | Freq: Once | INTRAVENOUS | Status: AC
  Administered 2017-03-31: 2 g via INTRAVENOUS
  Filled 2017-03-31: qty 50

## 2017-03-31 MED ORDER — ASPIRIN 300 MG RE SUPP: 300.0000 mg | RECTAL | Status: DC

## 2017-03-31 MED ORDER — LEVOFLOXACIN IN D5W 500 MG/100ML IV SOLN: 500.0000 mg | INTRAVENOUS | Status: DC

## 2017-03-31 MED ORDER — ORAL CARE MOUTH RINSE: 15.0000 mL | Freq: Four times a day (QID) | OROMUCOSAL | Status: DC

## 2017-03-31 MED ORDER — ETOMIDATE 2 MG/ML IV SOLN
INTRAVENOUS | Status: AC | PRN
  Administered 2017-03-31: 20 mg via INTRAVENOUS

## 2017-03-31 MED ORDER — ASPIRIN 300 MG RE SUPP
300.0000 mg | Freq: Once | RECTAL | Status: DC
  Filled 2017-03-31: qty 1

## 2017-03-31 MED ORDER — HEPARIN BOLUS VIA INFUSION
4000.0000 [IU] | Freq: Once | INTRAVENOUS | Status: AC
  Administered 2017-03-31: 4000 [IU] via INTRAVENOUS
  Filled 2017-03-31: qty 4000

## 2017-03-31 MED ORDER — FENTANYL CITRATE (PF) 100 MCG/2ML IJ SOLN
INTRAMUSCULAR | Status: AC
  Filled 2017-03-31: qty 2

## 2017-03-31 MED ORDER — PROPOFOL 1000 MG/100ML IV EMUL: 5.0000 ug/kg/min | Freq: Once | INTRAVENOUS | Status: DC

## 2017-03-31 MED ORDER — VANCOMYCIN HCL 10 G IV SOLR
1500.0000 mg | Freq: Once | INTRAVENOUS | Status: AC
  Administered 2017-03-31: 1500 mg via INTRAVENOUS
  Filled 2017-03-31: qty 1500

## 2017-03-31 MED ORDER — ASPIRIN 81 MG PO CHEW: 81.0000 mg | CHEWABLE_TABLET | Freq: Once | ORAL | Status: DC

## 2017-03-31 MED ORDER — VANCOMYCIN HCL IN DEXTROSE 1-5 GM/200ML-% IV SOLN: 1000.0000 mg | INTRAVENOUS | Status: DC

## 2017-03-31 MED ORDER — PANTOPRAZOLE SODIUM 40 MG IV SOLR: 40.0000 mg | Freq: Every day | INTRAVENOUS | Status: DC

## 2017-04-02 ENCOUNTER — Other Ambulatory Visit: Payer: Self-pay | Admitting: Family Medicine

## 2017-04-05 LAB — CULTURE, BLOOD (ROUTINE X 2)
Culture: NO GROWTH
Culture: NO GROWTH
Special Requests: ADEQUATE
Special Requests: ADEQUATE

## 2017-04-05 NOTE — Progress Notes (Signed)
ANTICOAGULATION CONSULT NOTE - Initial Consult  Pharmacy Consult for heparin  Indication: chest pain/ACS  Allergies  Allergen Reactions  . Penicillins Shortness Of Breath and Rash    Has patient had a PCN reaction causing immediate rash, facial/tongue/throat swelling, SOB or lightheadedness with hypotension: Yes Has patient had a PCN reaction causing severe rash involving mucus membranes or skin necrosis: No Has patient had a PCN reaction that required hospitalization No Has patient had a PCN reaction occurring within the last 10 years: No If all of the above answers are "NO", then may proceed with Cephalosporin use.   Marland Kitchen Clopidogrel Bisulfate Rash    Severe rash  . Demerol Nausea And Vomiting  . Propoxyphene N-Acetaminophen Nausea Only    Patient Measurements: Height: 5\' 6"  (167.6 cm) IBW/kg (Calculated) : 63.8 Heparin Dosing Weight: 70.7 kg   Vital Signs: Temp: 97.2 F (36.2 C) (06/26 1336) Temp Source: Oral (06/26 1336) BP: 73/55 (06/26 1655) Pulse Rate: 113 (06/26 1655)  Labs:  Recent Labs  18-Apr-2017 1339  HGB 13.0  HCT 38.9*  PLT 94*  CREATININE 3.87*  TROPONINI 3.98*    Estimated Creatinine Clearance: 15.8 mL/min (A) (by C-G formula based on SCr of 3.87 mg/dL (H)).   Medical History: Past Medical History:  Diagnosis Date  . Anxiety    Dr. Corrinne Eagle    . ASCVD (arteriosclerotic cardiovascular disease)   . CAD (coronary artery disease)    Dr. Verl Blalock  - Cardiologist   . CHF (congestive heart failure) ([place])   . Dementia   . History of radiation therapy 11/15/08-02/23/09   RUL 66Gy/76fx  . Hyperlipidemia   . Hypertension   . Lung cancer ([place])    lung ca dx 1/10  . Neuropathic pain   . OA (osteoarthritis)   . PVD (peripheral vascular disease) ([place])    carotid artery disease  . Radiation May 2010   Right upper lobe lung/Adenocarcinoma   Assessment: 71 yo male admitted with respiratory distress, s/p PEA arrest. Pharmacy consulted to dose heparin for ACS/CP.  No anticoagulation PTA. Hgb 13 and platelets slightly low at 94. No overt s/s bleeding noted.   Goal of Therapy:  Heparin level 0.3-0.7 units/ml Monitor platelets by anticoagulation protocol: Yes   Plan:  Heparin 4000 units x1, then start infusion at 850 units/hr Heparin level in 8 hours Heparin level and CBC daily Monitor for s/s bleeding   Argie Ramming, PharmD Pharmacy Resident  Pager [phone] 04/18/17 5:06 PM

## 2017-04-05 NOTE — Progress Notes (Signed)
Pt expired at 2109. No breath or heart sounds auscultated. Verified by Rosetta Posner RN. Family present at bedside during time of death. MD notified.

## 2017-04-05 NOTE — ED Notes (Signed)
Family at bedside. 

## 2017-04-05 NOTE — Consult Note (Signed)
Cardiology Consultation:   Patient ID: [name]; [id number]; 28-Jun-1946   Admit date: 04-26-17 Date of Consult: April 26, 2017  Primary Care Provider: Chipper Herb, MD Primary Cardiologist: Dr. Burt [name]   Patient Profile:   [name] is a 71 y.o. male with a hx of CAD s/p CABG and PCI afterwards, carotid artery disease s/p R CEA, HLD, HTN and lung cancer s/p radiation therapy and possibly right upper lobectomy who is being seen today for the evaluation of elevated troponin at the request of [name].   Last cath (see detailed below) 11/04/16 for unstable angina showed continued patency of the LIMA-LAD, chronic occlusion of the SVG-OM1 and OM2, and patency of the SVG-OM3/Left PDA. Severe stenosis of the SVG-PDA/OM3 documented by FFR, treated successfully with a DES (3.5x16 mm Promus DES).   He was doing well on cardiac stand point when last seen by Dr. Burt [name] last 03/20/17 except bilateral calf pain. Follow up doppler showed normal ABIs'.   History of Present Illness:   [name] presented with 2 weeks of productive cough, fever and dyspnea. CXR demonstrated bilateral basilar infiltrates concerning for consolidation vs pulmonary edema. He was started on empiric coverage with Levaquin and vanc. He is intubated for acute hypoxic repiratory failure.  EKG showed sinus tachycardia with inferior lateral ST depression - personally reviewed. Troponin I of 3.98. BNP 424.4. Lactic acid 5.3. Scr 3.87. Mg 1.4. AST minimally elevated to 65  Past Medical History:  Diagnosis Date  . Anxiety    Dr. Corrinne [name]    . ASCVD (arteriosclerotic cardiovascular disease)   . CAD (coronary artery disease)    Dr. Verl [name]  - Cardiologist   . CHF (congestive heart failure) ([place])   . Dementia   . History of radiation therapy 11/15/08-02/23/09   RUL 66Gy/14fx  . Hyperlipidemia   . Hypertension   . Lung cancer ([place])    lung ca dx 1/10  . Neuropathic pain   . OA (osteoarthritis)   . PVD (peripheral  vascular disease) ([place])    carotid artery disease  . Radiation May 2010   Right upper lobe lung/Adenocarcinoma    Past Surgical History:  Procedure Laterality Date  . CARDIAC CATHETERIZATION  12/20/04   recurrent   . CARDIAC CATHETERIZATION N/A 11/04/2016   Procedure: Left Heart Cath and Cors/Grafts Angiography;  Surgeon: Matthew Mocha, MD;  Location: [place] [facility];  Service: Cardiovascular;  Laterality: N/A;  . CARDIAC CATHETERIZATION N/A 11/04/2016   Procedure: Intravascular Pressure Wire/FFR Study-seq SVG-OM3, PLA;  Surgeon: Matthew Mocha, MD;  Location: [place] [facility];  Service: Cardiovascular;  Laterality: N/A;  . CARDIAC CATHETERIZATION N/A 11/04/2016   Procedure: Coronary Stent Intervention;  Surgeon: Matthew Mocha, MD;  Location: [place] [facility];  Service: Cardiovascular;  Laterality: N/A;  . CAROTID ENDARTERECTOMY  12/98   right ([place])  . CORONARY ARTERY BYPASS GRAFT  7/95   x5 ([place] )  . LEFT HEART CATHETERIZATION WITH CORONARY ANGIOGRAM N/A 12/16/2013   Procedure: LEFT HEART CATHETERIZATION WITH CORONARY ANGIOGRAM;  Surgeon: Blane Ohara, MD;  Location: [facility];  Service: Cardiovascular;  Laterality: N/A;  . LUNG CANCER SURGERY     had ratiation of right lung  . PERIPHERAL VASCULAR CATHETERIZATION Right 10/30/2015   Procedure: Carotid PTA/Stent Intervention;  Surgeon: Lorretta Harp, MD;  Location: [place] [facility];  Service: Cardiovascular;  Laterality: Right;     Inpatient Medications: Scheduled Meds: . aspirin  324 mg Per Tube NOW  Or  . aspirin  300 mg Rectal NOW  . aspirin  81 mg Per Tube Once  . fentaNYL      . heparin  5,000 Units Subcutaneous Q8H  . pantoprazole (PROTONIX) IV  40 mg Intravenous QHS  . prasugrel  10 mg Per Tube Daily   Continuous Infusions: . sodium chloride    . [START ON 04/02/2017] levofloxacin (LEVAQUIN) IV    . levofloxacin (LEVAQUIN) IV 750 mg (2017/04/20 1435)  . magnesium sulfate 1 - 4 g bolus IVPB    .  norepinephrine (LEVOPHED) Adult infusion    . propofol    .  sodium bicarbonate  infusion 1000 mL    . sodium chloride    . vancomycin 1,500 mg (04-20-17 1459)  . [START ON 04/02/2017] vancomycin     PRN Meds: sodium chloride  Allergies:    Allergies  Allergen Reactions  . Penicillins Shortness Of Breath and Rash    Has patient had a PCN reaction causing immediate rash, facial/tongue/throat swelling, SOB or lightheadedness with hypotension: Yes Has patient had a PCN reaction causing severe rash involving mucus membranes or skin necrosis: No Has patient had a PCN reaction that required hospitalization No Has patient had a PCN reaction occurring within the last 10 years: No If all of the above answers are "NO", then may proceed with Cephalosporin use.   Marland Kitchen Clopidogrel Bisulfate Rash    Severe rash  . Demerol Nausea And Vomiting  . Propoxyphene N-Acetaminophen Nausea Only    Social History:   Social History   Social History  . Marital status: Married    Spouse name: N/A  . Number of children: N/A  . Years of education: N/A   Occupational History  . Not on file.   Social History Main Topics  . Smoking status: Former Research scientist (life sciences)  . Smokeless tobacco: Former Systems developer  . Alcohol use No  . Drug use: No  . Sexual activity: Not on file   Other Topics Concern  . Not on file   Social History Narrative  . No narrative on file    Family History:   The patient's family history includes Coronary artery disease in his other; Heart attack in his mother; Heart disease in his brother, father, and sister.  ROS:  Please see the history of present illness.  ROS unable  To review was patient is intubated currently     Physical Exam/Data:   Vitals:   2017/04/20 1431 2017/04/20 1446 Apr 20, 2017 1500 04-20-17 1530  BP: (!) 116/96 (!) 72/59 (!) 71/60 (!) 70/55  Pulse:  (!) 113 (!) 112 (!) 107  Resp: 20 20 (!) 24 (!) 24  Temp:      TempSrc:      SpO2:   (!) 85% (!) 81%  Height:       No intake  or output data in the 24 hours ending 04-20-17 1547 There were no vitals filed for this visit. There is no height or weight on file to calculate BMI.  General:  intubated HEENT: normal Lymph: no adenopathy Neck: no JVD Endocrine:  No thryomegaly Vascular: No carotid bruits; FA pulses 2+ bilaterally without bruits  Cardiac:  normal S1, S2; RRR; no murmur Lungs:  Course breath sound  Abd: soft, nontender, no hepatomegaly  Ext: no edema Musculoskeletal:  No deformities, BUE and BLE strength normal and equal Skin: warm and dry  Neuro:  Intubated Psych: intubated   Relevant CV Studies: Coronary Stent Intervention   11/04/16  Intravascular  Pressure Wire/FFR Study-seq SVG-OM3, PLA  Left Heart Cath and Cors/Grafts Angiography  Conclusion   1. Severe native vessel CAD with patency of a small nondominant RCA and chronic occlusion of the left coronary artery.  2. Mild contraction abnormality of the LV with preserved LVEF 3. S/P CABG with continued patency of the LIMA-LAD, chronic occlusion of the SVG-OM1 and OM2, and patency of the SVG-OM3/Left PDA.  4. Severe stenosis of the SVG-PDA/OM3 documented by FFR, treated successfully with a DES (3.5x16 mm Promus DES)  Continue home medical therapy. Anticipate DC home tomorrow.    Diagnostic Diagram       Post-Intervention Diagram           Laboratory Data:  Chemistry Recent Labs Lab 04-21-2017 1339  NA 129*  K 3.6  CL 96*  CO2 17*  GLUCOSE 102*  BUN 52*  CREATININE 3.87*  CALCIUM 7.7*  GFRNONAA 14*  GFRAA 17*  ANIONGAP 16*     Recent Labs Lab 04-21-2017 1339  PROT 6.5  ALBUMIN 3.4*  AST 65*  ALT 22  ALKPHOS 35*  BILITOT 1.5*   Hematology Recent Labs Lab 04-21-17 1339  WBC 3.6*  RBC 4.44  HGB 13.0  HCT 38.9*  MCV 87.6  MCH 29.3  MCHC 33.4  RDW 15.4  PLT 94*   Cardiac Enzymes Recent Labs Lab 04/21/17 1339  TROPONINI 3.98*   No results for input(s): TROPIPOC in the last 168 hours.  BNP Recent  Labs Lab Apr 21, 2017 1339  BNP 424.0*    DDimer No results for input(s): DDIMER in the last 168 hours.  Radiology/Studies:  Dg Chest Portable 1 View  Result Date: 04/21/17 CLINICAL DATA:  increasing sob for several days. pt has hx of partial lung removal. Pt report productive cough. Family reports that he has had pneumonia in the past. EXAM: PORTABLE CHEST - 1 VIEW COMPARISON:  CT 08/13/2016, chest 11/14/2015 FINDINGS: Endotracheal tube has been placed, tip approximately 3.2 cm above carina. Nasogastric tube has been placed at least as far as the stomach, tip not seen. Chronic pleuroparenchymal scarring at the right lung apex is stable. Perihilar and bibasilar interstitial and airspace infiltrates or edema, left greater than right, new since previous. Heart size and mediastinal contours are within normal limits. No definite effusion.  No pneumothorax. Previous CABG. IMPRESSION: 1. Asymmetric perihilar and bibasilar edema or infiltrates, left greater than right, new since previous. 2. Endotracheal tube and nasogastric tube placement as above. Electronically Signed   By: Lucrezia Europe M.D.   On: 04/21/2017 15:22    Assessment and Plan:   1. Acute hypoxic respiratory failure 2nd to sepsis, CAP, CHF, and shock - Per PCCM.   2. CAD s/p CABG and multiple PCI - Last cath 10/2016 as above. EKG with ST depression in inferior lateral leads. Troponin I of 3.98. Continue cycle troponin. Cardiac evaluation when stable.   3. Carotid artery diease s/p R CEA - Last doppler 09/2016 showed stable finding ( 60-79% stenosis on the right and chronic occlusion of the left carotid artery). F/u in 1 year.    Jarrett [name], [place]  April 21, 2017 3:47 PM   Patient seen, examined. Available data reviewed. Agree with findings, assessment, and plan as outlined by Robbie Lis, PA. The patient is well known to me from the outpatient setting. He has longstanding ischemic heart disease. He was in his normal state of  health until the past 48-72 hours. His wife reports that this morning the patient was confused and restless. She  called 911 and the patient is transported to the ER where he required intubation for acute hypoxemic respiratory failure. He then suffered a PEA arrest and was resuscitated after 5-6 minutes per review of CCM notes. His lactate is elevated at 5.3 and he has AKI with a creatinine of 3.8 (baseline < 1.0). Procalcitonin is 71. His skin is mottled and he remains hypotensive with SBP 70 mmHg on levophed. He is unresponsive, intubated, HEENT nl. JVP is normal, lungs with diffuse rhonchi, heart RRR and distant, abdomen soft, extremities cool without edema.   All lab and radiographic data is reviewed. EKG shows sinus tachycardia with severe diffuse ST depression c/w diffuse ischemia/subendoacrdial injury.   The patient has profound shock, likely a mix of septic and cardiogenic shock. He has multiorgan failure complicated now by PEA arrest. His prognosis is extremely poor. I discussed this at length with the family and discussed with Dr [name] with CCM. It seems reasonable to continue his current level of support with vasopressor agents, mechanical ventilation, and broad spectrum IV antibiotics. He is DNR and no further CPR will be performed considering his poor prognosis. He is on IV heparin in the setting of marked ECG changes and likely diffuse myocardial ischemia. Will follow with you.  The patient is critically ill with multiple organ systems failure and requires high complexity decision making for assessment and support, frequent evaluation and titration of therapies, application of advanced monitoring technologies and extensive interpretation of multiple databases.   Critical Care Time devoted to patient care services described in this note is 45 minutes.  [name], M.D. 2017/04/05 10:22 PM

## 2017-04-05 NOTE — ED Notes (Signed)
Xray called for portable

## 2017-04-05 NOTE — ED Notes (Signed)
Preparing for intubation

## 2017-04-05 NOTE — ED Notes (Signed)
Family and chaplain at bedside

## 2017-04-05 NOTE — Progress Notes (Signed)
Responded to ED nurse call to support patient and family.  Patient actively dying. Per EDP and critical care team patient is not likely to survive.  Wife and two daughters at bedside. Provided empathetic listening, emotional and grief support and prayer.  Will pass on to on call chaplain to continue support as needed.   Apr 25, 2017 1634  Clinical Encounter Type  Visited With Patient and family together;Health care provider  Visit Type Initial;Spiritual support;ED;Patient actively dying  Referral From Nurse  Spiritual Encounters  Spiritual Needs Prayer;Emotional;Grief support  Stress Factors  Family Stress Factors Exhausted;Loss;Major life changes  Cristopher Peru, [facility], Pager [phone]

## 2017-04-05 NOTE — Progress Notes (Signed)
Pharmacy Antibiotic Note [name] is a 71 y.o. male admitted on 04/03/2017 with SOB. Hx of partial lung removal. Pharmacy consulted to dose levaquin and vancomycin for PNA. Patient is afebrile, WBC 3.6, and LA 5.3. SCr 3.87 (baseline ~0.9) for estimated CrCl ~ 15-20 mL/min.   Plan (pre-labs): -Levaquin 750 mg IV x1  -Vancomycin 1500 mg IV x 1  -Monitor renal fx, cultures, VT as indicated  Plan (post-labs):  -Continue Levaquin at 500 mg IV q48hr -Continue Vancomycin at 1g IV q48hr  -Monitor renal function, clinical picture, and culture data -Vancomycin trough at SS and prn (goal 15-20 mcg/mL) -F/u length of therapy    Height: 5\' 6"  (167.6 cm) IBW/kg (Calculated) : 63.8  Temp (24hrs), Avg:97.2 F (36.2 C), Min:97.2 F (36.2 C), Max:97.2 F (36.2 C)   Recent Labs Lab 2017/04/03 1339 04/03/17 1414  WBC 3.6*  --   CREATININE 3.87*  --   LATICACIDVEN  --  5.30*    Estimated Creatinine Clearance: 15.8 mL/min (A) (by C-G formula based on SCr of 3.87 mg/dL (H)).    Allergies  Allergen Reactions  . Penicillins Shortness Of Breath and Rash    Has patient had a PCN reaction causing immediate rash, facial/tongue/throat swelling, SOB or lightheadedness with hypotension: Yes Has patient had a PCN reaction causing severe rash involving mucus membranes or skin necrosis: No Has patient had a PCN reaction that required hospitalization No Has patient had a PCN reaction occurring within the last 10 years: No If all of the above answers are "NO", then may proceed with Cephalosporin use.   Marland Kitchen Clopidogrel Bisulfate Rash    Severe rash  . Demerol Nausea And Vomiting  . Propoxyphene N-Acetaminophen Nausea Only    Antimicrobials this admission: 6/26 vancomycin > 6/26 levaquin >  Dose adjustments this admission: N/A  Microbiology results: 6/26 blood cx:  Argie Ramming, PharmD Pharmacy Resident  Pager [phone] 2017/04/03 3:36 PM

## 2017-04-05 NOTE — ED Notes (Signed)
ED Provider at bedside. 

## 2017-04-05 NOTE — Progress Notes (Signed)
RT unable to obtain ABG sample ordered at this time. RT attempted x 3 tries with no success.

## 2017-04-05 NOTE — Progress Notes (Signed)
Pharmacy Antibiotic Note [name] is a 71 y.o. male admitted on 04/05/17 with SOB. Hx of partial lung removal. All labs pending, afebrile, WBC 3.6.   Plan: -Levaquin 750 mg IV x1 -Vancomycin 1500 mg IV x 1  -Monitor renal fx, cultures, VT as indicated   Height: 5\' 6"  (167.6 cm) IBW/kg (Calculated) : 63.8  Temp (24hrs), Avg:97.2 F (36.2 C), Min:97.2 F (36.2 C), Max:97.2 F (36.2 C)   Recent Labs Lab 05-Apr-2017 1339 04-05-2017 1414  WBC 3.6*  --   LATICACIDVEN  --  5.30*    CrCl cannot be calculated (Patient's most recent lab result is older than the maximum 21 days allowed.).    Allergies  Allergen Reactions  . Penicillins Shortness Of Breath and Rash    Has patient had a PCN reaction causing immediate rash, facial/tongue/throat swelling, SOB or lightheadedness with hypotension: Yes Has patient had a PCN reaction causing severe rash involving mucus membranes or skin necrosis: No Has patient had a PCN reaction that required hospitalization No Has patient had a PCN reaction occurring within the last 10 years: No If all of the above answers are "NO", then may proceed with Cephalosporin use.   Marland Kitchen Clopidogrel Bisulfate Rash    Severe rash  . Demerol Nausea And Vomiting  . Propoxyphene N-Acetaminophen Nausea Only    Antimicrobials this admission: 6/26 vancomycin > 6/26 levaquin >  Dose adjustments this admission: N/A  Microbiology results: 6/26 blood cx:  Harvel Quale Apr 05, 2017 2:42 PM

## 2017-04-05 NOTE — ED Notes (Signed)
Bicarbonate administered

## 2017-04-05 NOTE — ED Notes (Signed)
NP Kary Kos and NP Heber Tenafly made aware that patient is now bradying down into the 58s. No pulse felt upon critical care team entering the room, cpr started

## 2017-04-05 NOTE — ED Notes (Signed)
Bicarbonate pushed

## 2017-04-05 NOTE — ED Triage Notes (Signed)
Per EMS_ pt has had increasing sob for several days. pt has hx of partial lung removal. Pt report productive cough. Family reports that he has had pneumonia in the past. Pt was started on 15L NRB by fire for "low sats". Pt was decreased to 2L with EMS sats at 80% t increased to 92 on 6L. upon arrival to room pt was 81% on 6L.

## 2017-04-05 NOTE — Discharge Summary (Signed)
71 year old admitted with shortness of breath for 2 weeks with sudden worsening overnight, intubated in the ED for hypoxia, requiring up to a PEEP of 12 with subsequent hypotension and then developed cardiac arrest requiring 6 minutes of CPR and ACLS before ROSC. He has a history of right upper lobectomy and severe CAD, recent stent to graft to PDA   Chest x-ray showed bilateral pulmonary lower lobe infiltrates ABG showed severe metabolic acidosis Labs showed hyponatremia with increased creatinine to 3.8, troponin of 3.9 and lactate of 5.3, no leukocytosis  Impression/plan-  Cardiac arrest/shock, likely cardiogenic - all likely related to acute coronary syndrome with acute pulmonary edema, however differential diagnosis does include community-acquired pneumonia- absence of leukocytosis, secretions and prodrome argues against thisbut pro-calcitonin was high Continue Levophed drip, discussed with cardiology, doubt that he is a candidate for further intervention such as balloon pump or cardiac cath Start IV heparin  Acute respiratory failure with hypoxia-likely related to acute pulmonary edema Ventilator settings were adjusted the bedside, PEEP was titrated all the way up to 15 but he remained hypoxic with saturation in the low 80s  In spite of about supportive measures, he passed away within hours of admission. Cause of death -acute respiratory failure, acute pulmonary edema,NSTEMI , possible pneumonia  Rigoberto Noel. MD

## 2017-04-05 NOTE — ED Triage Notes (Signed)
Pt placed on NRB.

## 2017-04-05 NOTE — Progress Notes (Signed)
Pt. Was transported to 2M12 without any complications.

## 2017-04-05 NOTE — ED Notes (Signed)
Return of pulses. Critical care NPs remain at bedside

## 2017-04-05 NOTE — ED Notes (Signed)
Respiratory at bedside.

## 2017-04-05 NOTE — ED Notes (Signed)
Patient placed on non rebreather due to O2 sat 80% on [place]

## 2017-04-05 NOTE — ED Notes (Signed)
Epi administered

## 2017-04-05 NOTE — ED Provider Notes (Signed)
[name] Provider Note   By signing my name below, I, [name], attest that this documentation has been prepared under the direction and in the presence of Matthew Manifold, MD. Electronically Signed: Bea [name], ED Scribe. 04-13-2017. 1:58 PM.    History   Chief Complaint Chief Complaint  Patient presents with  . Respiratory Distress   The history is provided by the patient and medical records. No language interpreter was used.    HPI Comments:  [name] is a 71 y.o. Male with PMHx of CAD, CHF, dementia, HLD, HTN, lung cancer, PVD, brought in by EMS, who presents to the Emergency Department complaining of increasing SOB that began about two weeks ago. He reports associated productive cough. He has not taken anything for symptom relief. There are no modifying factors noted. He is currently critically ill and just can't give additional history.   Past Medical History:  Diagnosis Date  . Anxiety    Dr. Corrinne Eagle    . ASCVD (arteriosclerotic cardiovascular disease)   . CAD (coronary artery disease)    Dr. Verl Blalock  - Cardiologist   . CHF (congestive heart failure) ([place])   . Dementia   . History of radiation therapy 11/15/08-02/23/09   RUL 66Gy/23fx  . Hyperlipidemia   . Hypertension   . Lung cancer ([place])    lung ca dx 1/10  . Neuropathic pain   . OA (osteoarthritis)   . PVD (peripheral vascular disease) ([place])    carotid artery disease  . Radiation May 2010   Right upper lobe lung/Adenocarcinoma    Patient Active Problem List   Diagnosis Date Noted  . Coronary artery disease with exertional angina ([place])   . Right carotid bruit 11/23/2014  . Claudication ([place]) 11/23/2014  . Thrombocytopenia ([place]) 07/17/2014  . Radiation fibrosis of lung ([place]) 07/17/2014  . Peripheral vascular insufficiency ([place]) 07/17/2014  . Peripheral neuropathy due to chemotherapy ([place]) 07/17/2014  . Allergic rhinitis due to pollen 07/17/2014  . Adenocarcinoma of the right upper  lobe 03/09/2014  . BPH (benign prostatic hyperplasia) 03/09/2014  . Need for prophylactic vaccination and inoculation against influenza 09/24/2013  . Hyperlipemia 09/24/2013  . Ecchymosis 09/24/2013  . Mild eczema 09/24/2013  . Chronic pain syndrome 06/21/2013  . Anxiety 03/17/2013  . FEMORAL BRUIT 12/04/2010  . HYPERTENSION, BENIGN 12/13/2008  . CAD, ARTERY BYPASS GRAFT 12/13/2008  . 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 MYOCARDIAL INFARCTION 10/16/2008  . Swelling, mass, or lump in chest 10/16/2008    Past Surgical History:  Procedure Laterality Date  . CARDIAC CATHETERIZATION  12/20/04   recurrent   . CARDIAC CATHETERIZATION N/A 11/04/2016   Procedure: Left Heart Cath and Cors/Grafts Angiography;  Surgeon: [name], MD;  Location: [facility];  Service: Cardiovascular;  Laterality: N/A;  . CARDIAC CATHETERIZATION N/A 11/04/2016   Procedure: Intravascular Pressure Wire/FFR Study-seq SVG-OM3, PLA;  Surgeon: [name], MD;  Location: [facility];  Service: Cardiovascular;  Laterality: N/A;  . CARDIAC CATHETERIZATION N/A 11/04/2016   Procedure: Coronary Stent Intervention;  Surgeon: [name], MD;  Location: [facility];  Service: Cardiovascular;  Laterality: N/A;  . CAROTID ENDARTERECTOMY  12/98   right ([place])  . CORONARY ARTERY BYPASS GRAFT  7/95   x5 ([place] )  . LEFT HEART CATHETERIZATION WITH CORONARY ANGIOGRAM N/A 12/16/2013   Procedure: LEFT HEART CATHETERIZATION WITH CORONARY ANGIOGRAM;  Surgeon: Blane Ohara, MD;  Location: [facility];  Service: Cardiovascular;  Laterality: N/A;  .  LUNG CANCER SURGERY     had ratiation of right lung  . PERIPHERAL VASCULAR CATHETERIZATION Right 10/30/2015   Procedure: Carotid PTA/Stent Intervention;  Surgeon: Lorretta Harp, MD;  Location: [facility];  Service: Cardiovascular;  Laterality: Right;       Home Medications    Prior to Admission medications   Medication  Sig Start Date End Date Taking? Authorizing Provider  acetaminophen (TYLENOL) 500 MG tablet Take 500-1,000 mg by mouth 2 (two) times daily as needed for moderate pain.    [provider]  ALPRAZolam Duanne Moron) 1 MG tablet TAKE 1 TABLET BY MOUTH THREE TIMES A DAY AS NEEDED FOR ANXIETY 03/30/17   Chipper Herb, MD  amLODipine (NORVASC) 10 MG tablet TAKE 1 TABLET (10 MG TOTAL) BY MOUTH DAILY. 11/06/16   [name], MD  aspirin EC 81 MG tablet Take 1 tablet (81 mg total) by mouth daily. 12/09/13   [name], MD  cholecalciferol (VITAMIN D) 1000 units tablet Take 1,000 Units by mouth daily.    [provider]  fenofibrate 160 MG tablet TAKE 1 TABLET (160 MG TOTAL) BY MOUTH DAILY. 03/03/17   Chipper Herb, MD  fluticasone (FLONASE) 50 MCG/ACT nasal spray PLACE 2 SPRAYS INTO BOTH NOSTRILS DAILY. 08/11/16   Chipper Herb, MD  gabapentin (NEURONTIN) 300 MG capsule TAKE ONE CAPSULE BY MOUTH EVERY 8 HOURS 11/27/16   Chipper Herb, MD  hydrochlorothiazide (MICROZIDE) 12.5 MG capsule Take 12.5 mg by mouth 2 (two) times daily.    [provider]  HYDROcodone-acetaminophen (NORCO) 10-325 MG tablet Take 1 tablet by mouth every 6 (six) hours as needed (pain). 03/10/17   Chipper Herb, MD  isosorbide mononitrate (IMDUR) 60 MG 24 hr tablet Take 1 tablet (60 mg total) by mouth daily. NEEDS APPOINTMENT FOR FUTURE REFILLS OR 90-DAY SUPPLY 10/09/16   Lorretta Harp, MD  metoprolol (LOPRESSOR) 50 MG tablet Take 1 tablet (50 mg total) by mouth 2 (two) times daily. 11/19/16   Leanor Kail, PA  Multiple Vitamin (MULTIVITAMIN) capsule Take 1 capsule by mouth daily.      [provider]  nitroGLYCERIN (NITROSTAT) 0.4 MG SL tablet Place 1 tablet (0.4 mg total) under the tongue every 5 (five) minutes as needed for chest pain. 11/03/16   [name], MD  Polyvinyl Alcohol (LUBRICANT DROPS OP) Apply 1 drop to eye daily as needed (dry eyes).    [provider]  potassium  chloride SA (KLOR-CON M20) 20 MEQ tablet Take 1 tablet (20 mEq total) by mouth daily. 07/09/16   [name], MD  prasugrel (EFFIENT) 10 MG TABS tablet Take 10 mg by mouth daily. Reported on 10/25/2015    [provider]  rosuvastatin (CRESTOR) 40 MG tablet TAKE 1 TABLET BY MOUTH EVERY DAY 01/09/17   Chipper Herb, MD    Family History Family History  Problem Relation Age of Onset  . Heart attack Mother   . Heart disease Father   . Heart disease Sister   . Heart disease Brother   . Coronary artery disease Other     Social History Social History  Substance Use Topics  . Smoking status: Former Research scientist (life sciences)  . Smokeless tobacco: Former Systems developer  . Alcohol use No     Allergies   Penicillins; Clopidogrel bisulfate; Demerol; and Propoxyphene n-acetaminophen   Review of Systems Review of Systems All other systems reviewed and are negative for acute change except as noted in the HPI.  Physical Exam Updated Vital Signs BP 115/64 (BP Location: Left Arm)   Pulse (!) 107   Temp 97.2 F (36.2 C) (Oral)   Resp 20   SpO2 (!) 77%   Physical Exam  Constitutional: He appears well-developed and well-nourished.  HENT:  Head: Normocephalic.  Eyes: EOM are normal.  Neck: Normal range of motion.  Cardiovascular: Regular rhythm and normal heart sounds.  Tachycardia present.   Pulmonary/Chest: He is in respiratory distress. He has rhonchi (bilateral).  Sternotomy scar. Cannot talk due to dyspnea.  Abdominal: Soft. He exhibits no distension. There is no tenderness.  Musculoskeletal: Normal range of motion.  Neurological: He is alert.  Skin: Skin is warm and dry.  Psychiatric: He has a normal mood and affect. He is agitated (mild).  Nursing note and vitals reviewed.    ED Treatments / Results  DIAGNOSTIC STUDIES: Oxygen Saturation is 77% on 15 L/min face mask, low by my interpretation.   COORDINATION OF CARE: 1:44 PM- Will intubate patient. Pt verbalizes understanding and  agrees to plan.  INTUBATION Performed by: Matthew Manifold, MD  Required items: required blood products, implants, devices, and special equipment available Patient identity confirmed: provided demographic data and hospital-assigned identification number Time out: Immediately prior to procedure a "time out" was called to verify the correct patient, procedure, equipment, support staff and site/side marked as required.  Indications: respiratory failure  Intubation method: Glidescope Laryngoscopy   Preoxygenation: BVM  Sedatives: 20 mg Etomidate Paralytic: 80 mg Succinylcholine  Tube Size: 7.5 cuffed  Post-procedure assessment: chest rise and ETCO2 monitor Breath sounds: equal and absent over the epigastrium Tube secured with: ETT holder Chest x-ray interpreted by radiologist and me.  Chest x-ray findings: endotracheal tube in appropriate position  Patient tolerated the procedure well with no immediate complications.  CRITICAL CARE Performed by: Matthew Manifold, MD Total critical care time: 40 minutes Critical care time was exclusive of separately billable procedures and treating other patients. Critical care was necessary to treat or prevent imminent or life-threatening deterioration. Critical care was time spent personally by me on the following activities: development of treatment plan with patient and/or surrogate as well as nursing, discussions with consultants, evaluation of patient's response to treatment, examination of patient, obtaining history from patient or surrogate, ordering and performing treatments and interventions, ordering and review of laboratory studies, ordering and review of radiographic studies, pulse oximetry and re-evaluation of patient's condition.   Medications  propofol (DIPRIVAN) 1000 MG/100ML infusion (not administered)  fentaNYL (SUBLIMAZE) 100 MCG/2ML injection (not administered)  vancomycin (VANCOCIN) 1,500 mg in sodium chloride 0.9 % 500 mL IVPB  (1,500 mg Intravenous New Bag/Given 04-27-17 1459)  vancomycin (VANCOCIN) IVPB 1000 mg/200 mL premix (not administered)  levofloxacin (LEVAQUIN) IVPB 500 mg (not administered)  prasugrel (EFFIENT) tablet 10 mg (not administered)  0.9 %  sodium chloride infusion (not administered)  heparin injection 5,000 Units (not administered)  pantoprazole (PROTONIX) injection 40 mg (not administered)  norepinephrine (LEVOPHED) 4 mg in dextrose 5 % 250 mL (0.016 mg/mL) infusion (23 mcg/min Intravenous Rate/Dose Change 04/27/2017 1621)  aspirin chewable tablet 81 mg (not administered)  sodium bicarbonate 150 mEq in dextrose 5 % 1,000 mL infusion ( Intravenous New Bag/Given 2017/04/27 1620)  etomidate (AMIDATE) injection (20 mg Intravenous Given 04/27/17 1352)  rocuronium (ZEMURON) injection (80 mg Intravenous Given 04/27/17 1353)  levofloxacin (LEVAQUIN) IVPB 750 mg (750 mg Intravenous New Bag/Given April 27, 2017 1435)  sodium chloride 0.9 % bolus 1,000 mL (0 mLs Intravenous Stopped 04/27/2017 1509)  sodium  chloride 0.9 % bolus 1,000 mL (1,000 mLs Intravenous New Bag/Given 04-08-17 1510)  magnesium sulfate IVPB 2 g 50 mL (2 g Intravenous New Bag/Given 2017-04-08 1548)  sodium chloride 0.9 % bolus 1,000 mL (1,000 mLs Intravenous New Bag/Given 04/08/17 1549)    Labs (all labs ordered are listed, but only abnormal results are displayed) Labs Reviewed  CBC WITH DIFFERENTIAL/PLATELET - Abnormal; Notable for the following:       Result Value   WBC 3.6 (*)    HCT 38.9 (*)    Platelets 94 (*)    Lymphs Abs 0.6 (*)    All other components within normal limits  COMPREHENSIVE METABOLIC PANEL - Abnormal; Notable for the following:    Sodium 129 (*)    Chloride 96 (*)    CO2 17 (*)    Glucose, Bld 102 (*)    BUN 52 (*)    Creatinine, Ser 3.87 (*)    Calcium 7.7 (*)    Albumin 3.4 (*)    AST 65 (*)    Alkaline Phosphatase 35 (*)    Total Bilirubin 1.5 (*)    GFR calc non Af Amer 14 (*)    GFR calc Af Amer 17 (*)    Anion gap  16 (*)    All other components within normal limits  TROPONIN I - Abnormal; Notable for the following:    Troponin I 3.98 (*)    All other components within normal limits  MAGNESIUM - Abnormal; Notable for the following:    Magnesium 1.4 (*)    All other components within normal limits  URINALYSIS, ROUTINE W REFLEX MICROSCOPIC - Abnormal; Notable for the following:    Color, Urine AMBER (*)    APPearance CLOUDY (*)    Hgb urine dipstick MODERATE (*)    Protein, ur 100 (*)    Bacteria, UA RARE (*)    Squamous Epithelial / LPF 0-5 (*)    All other components within normal limits  BRAIN NATRIURETIC PEPTIDE - Abnormal; Notable for the following:    B Natriuretic Peptide 424.0 (*)    All other components within normal limits  BASIC METABOLIC PANEL - Abnormal; Notable for the following:    CO2 16 (*)    Glucose, Bld 139 (*)    BUN 51 (*)    Creatinine, Ser 3.76 (*)    Calcium 6.7 (*)    GFR calc non Af Amer 15 (*)    GFR calc Af Amer 17 (*)    Anion gap 18 (*)    All other components within normal limits  PROTIME-INR - Abnormal; Notable for the following:    Prothrombin Time 26.3 (*)    All other components within normal limits  APTT - Abnormal; Notable for the following:    aPTT >200 (*)    All other components within normal limits  GLUCOSE, CAPILLARY - Abnormal; Notable for the following:    Glucose-Capillary 122 (*)    All other components within normal limits  GLUCOSE, CAPILLARY - Abnormal; Notable for the following:    Glucose-Capillary 132 (*)    All other components within normal limits  I-STAT ARTERIAL BLOOD GAS, ED - Abnormal; Notable for the following:    pH, Arterial 7.095 (*)    pO2, Arterial 64.0 (*)    Bicarbonate 12.6 (*)    Acid-base deficit 17.0 (*)    All other components within normal limits  I-STAT CG4 LACTIC ACID, ED - Abnormal; Notable for the following:  Lactic Acid, Venous 5.30 (*)    All other components within normal limits  CULTURE, BLOOD  (ROUTINE X 2)  CULTURE, BLOOD (ROUTINE X 2)  MRSA PCR SCREENING  PROCALCITONIN    EKG  EKG Interpretation  Date/Time:  04/13/2017 14:48:26 EDT Ventricular Rate:  112 PR Interval:    QRS Duration: 118 QT Interval:  324 QTC Calculation: 443 R Axis:   79 Text Interpretation:  Sinus tachycardia LVH with secondary repolarization abnormality Anterior Q waves, possibly due to LVH ST depression, consider ischemia, diffuse lds Since last EKG, there now appears to be ST elevation in aVR, aVL, V1, and V2 Rhythm now appears sinus Confirmed by Duffy Bruce [phone]) on 04/01/2017 5:03:30 PM       Radiology Dg Chest Portable 1 View  Result Date: 2017-04-13 CLINICAL DATA:  increasing sob for several days. pt has hx of partial lung removal. Pt report productive cough. Family reports that he has had pneumonia in the past. EXAM: PORTABLE CHEST - 1 VIEW COMPARISON:  CT 08/13/2016, chest 11/14/2015 FINDINGS: Endotracheal tube has been placed, tip approximately 3.2 cm above carina. Nasogastric tube has been placed at least as far as the stomach, tip not seen. Chronic pleuroparenchymal scarring at the right lung apex is stable. Perihilar and bibasilar interstitial and airspace infiltrates or edema, left greater than right, new since previous. Heart size and mediastinal contours are within normal limits. No definite effusion.  No pneumothorax. Previous CABG. IMPRESSION: 1. Asymmetric perihilar and bibasilar edema or infiltrates, left greater than right, new since previous. 2. Endotracheal tube and nasogastric tube placement as above. Electronically Signed   By: Lucrezia Europe M.D.   On: 04-13-17 15:22    Procedures Procedures (including critical care time)  Medications Ordered in ED Medications  propofol (DIPRIVAN) 1000 MG/100ML infusion (not administered)  fentaNYL (SUBLIMAZE) 100 MCG/2ML injection (not administered)  vancomycin (VANCOCIN) 1,500 mg in sodium chloride 0.9 % 500 mL IVPB (1,500 mg  Intravenous New Bag/Given 2017/04/13 1459)  vancomycin (VANCOCIN) IVPB 1000 mg/200 mL premix (not administered)  levofloxacin (LEVAQUIN) IVPB 500 mg (not administered)  prasugrel (EFFIENT) tablet 10 mg (not administered)  0.9 %  sodium chloride infusion (not administered)  heparin injection 5,000 Units (not administered)  pantoprazole (PROTONIX) injection 40 mg (not administered)  norepinephrine (LEVOPHED) 4 mg in dextrose 5 % 250 mL (0.016 mg/mL) infusion (23 mcg/min Intravenous Rate/Dose Change Apr 13, 2017 1621)  aspirin chewable tablet 81 mg (not administered)  sodium bicarbonate 150 mEq in dextrose 5 % 1,000 mL infusion ( Intravenous New Bag/Given Apr 13, 2017 1620)  etomidate (AMIDATE) injection (20 mg Intravenous Given 04-13-17 1352)  rocuronium (ZEMURON) injection (80 mg Intravenous Given 04-13-2017 1353)  levofloxacin (LEVAQUIN) IVPB 750 mg (750 mg Intravenous New Bag/Given Apr 13, 2017 1435)  sodium chloride 0.9 % bolus 1,000 mL (0 mLs Intravenous Stopped 2017-04-13 1509)  sodium chloride 0.9 % bolus 1,000 mL (1,000 mLs Intravenous New Bag/Given 2017/04/13 1510)  magnesium sulfate IVPB 2 g 50 mL (2 g Intravenous New Bag/Given 04-13-2017 1548)  sodium chloride 0.9 % bolus 1,000 mL (1,000 mLs Intravenous New Bag/Given 04/13/17 1549)     Initial Impression / Assessment and Plan / ED Course  I have reviewed the triage vital signs and the nursing notes.  Pertinent labs & imaging results that were available during my care of the patient were reviewed by me and considered in my medical decision making (see chart for details).     Pt was in severe respiratory distress when I evaluated  him soon after he arrived. Hypoxic in 80s on a NRB. Diaphoretic. Would follow simple commands but could not speak secondary to respiratory distress. Coughing up thick tan secretions. He was intubated. Empiric abx ordered.   STE in aVR and diffuse ST depression elsewhere. Can be seen with LMCA or proximal LAD occlusion but he has been  persistently hypoxic and this could also be from global subendocardial ischemia. Known CAD, but I'm not sure of his specific anatomy. I could not get any history from him to know if he has been having any pain.   3:00 PM Further history from wife. On Friday he began complaining of chills. On Saturday he started becoming increasingly weaker. Productive cough. Complaining of intermittent chest pain. Sometimes improved with nitro, sometimes not. This morning "he looked like death" but wife got him into a hot shower and his breathing seemed to improve. Later in the day he began getting worse again so she called EMS   3:04 PM Troponin significant elevated. Could be demand ischemia with known severe CAD? CXR with multifocal pneumonia versus HF. Rectal ASA. Cardiology paged. ABG noted. PEEP, rate increased.   3:23 PM Initial normotensive. Hypotensive after intubation. Additional volume. Has what appears to be AKI. Discussed with CCM. Admit.   Final Clinical Impressions(s) / ED Diagnoses   Final diagnoses:  Acute respiratory failure with hypoxia (HCC)  NSTEMI (non-ST elevated myocardial infarction) [facility])    New Prescriptions New Prescriptions   No medications on file    I personally preformed the services scribed in my presence. The recorded information has been reviewed is accurate. Matthew Manifold, MD.     Matthew Manifold, MD 04/11/17 1052

## 2017-04-05 NOTE — Progress Notes (Addendum)
Pt s/p witnessed PEA arrest Time to ROSC 6 minutes.  ACLS provided included: CPR, ventilation, 2 cycles of epinephrine and 2 cycles bicarb (assuming etiology either hypoxia or acidosis) ROSC achieved and he was placed back on full support and levophed gtt  At this point I met w/ wife and daughters. I explained to them events. Also discussed the fact that he was in shock and critically ill. They shared with me his qol has been poor and he has been sick for some time.   Plan DNR if he arrests again Continue ICU supportive care This includes: Ventilation, central access and vasoactive gtt.  No further CPR, no defibrillation, no pacemaker, no antiarrhythmics for pulseless tachycardia.   My time 34 minutes.   Erick Colace ACNP-BC [place] Pager # [phone] OR # [phone] if no answer   Agree with above  Rigoberto Noel. MD

## 2017-04-05 NOTE — H&P (Signed)
PULMONARY / CRITICAL CARE MEDICINE   Name: [name] MRN: [id number] DOB: 1945/11/09    ADMISSION DATE:  10-Apr-2017 CONSULTATION DATE:  6/26  REFERRING MD:  Dr. Wilson Singer EDP  CHIEF COMPLAINT:  Dyspnea  HISTORY OF PRESENT ILLNESS:   71 year old male with PMH as below, which is significant for NSCLC daignosed 201 s/p radiation therapy and possibly right upper lobectomy, dementia, CAD, and CHF (grade 2 DD). He was admitted back in January of this year for scheduled heart cath and was found to have severe native vessel CAD, but he is post CABG with mostly patent grafts the exceptioin being SVG-PDA/OM3 which was successfully stented.  He presented to [facility] ED 6/26 with complaints of dyspnea x 2 weeks with associated with productive cough (tan sputum) and fevers. In the emergency department he was profoundly hypoxemic with O2 sat 80% on non-rebreather.  He was intubated for hypoxia and increased WOB. CXR demonstrated bilateral basilar infiltrates concerning for consolidation vs pulmonary edema. He was started on empiric coverage with Levaquin and vancomycin. PCCM asked to admit.  Upon my arrival to ED he was on vent and profoudnly hypoxic with PaO2 68 and sats 80s on 100% and 12 PEEP. He was hypotensive with SBP 70s.   PAST MEDICAL HISTORY :  He  has a past medical history of Anxiety; ASCVD (arteriosclerotic cardiovascular disease); CAD (coronary artery disease); CHF (congestive heart failure) ([place]); Dementia; History of radiation therapy (11/15/08-02/23/09); Hyperlipidemia; Hypertension; Lung cancer ([place]); Neuropathic pain; OA (osteoarthritis); PVD (peripheral vascular disease) ([place]); and Radiation (May 2010).  PAST SURGICAL HISTORY: He  has a past surgical history that includes Coronary artery bypass graft (7/95); Carotid endarterectomy (12/98); Cardiac catheterization (12/20/04); Lung cancer surgery; left heart catheterization with coronary angiogram (N/A, 12/16/2013); Cardiac catheterization  (Right, 10/30/2015); Cardiac catheterization (N/A, 11/04/2016); Cardiac catheterization (N/A, 11/04/2016); and Cardiac catheterization (N/A, 11/04/2016).  Allergies  Allergen Reactions  . Penicillins Shortness Of Breath and Rash    Has patient had a PCN reaction causing immediate rash, facial/tongue/throat swelling, SOB or lightheadedness with hypotension: Yes Has patient had a PCN reaction causing severe rash involving mucus membranes or skin necrosis: No Has patient had a PCN reaction that required hospitalization No Has patient had a PCN reaction occurring within the last 10 years: No If all of the above answers are "NO", then may proceed with Cephalosporin use.   Marland Kitchen Clopidogrel Bisulfate Rash    Severe rash  . Demerol Nausea And Vomiting  . Propoxyphene N-Acetaminophen Nausea Only    Current Facility-Administered Medications on File Prior to Encounter  Medication  . cyanocobalamin ((VITAMIN B-12)) injection 1,000 mcg   Current Outpatient Prescriptions on File Prior to Encounter  Medication Sig  . acetaminophen (TYLENOL) 500 MG tablet Take 500-1,000 mg by mouth 2 (two) times daily as needed for moderate pain.  Marland Kitchen ALPRAZolam (XANAX) 1 MG tablet TAKE 1 TABLET BY MOUTH THREE TIMES A DAY AS NEEDED FOR ANXIETY  . amLODipine (NORVASC) 10 MG tablet TAKE 1 TABLET (10 MG TOTAL) BY MOUTH DAILY.  Marland Kitchen aspirin EC 81 MG tablet Take 1 tablet (81 mg total) by mouth daily.  . cholecalciferol (VITAMIN D) 1000 units tablet Take 1,000 Units by mouth daily.  . fenofibrate 160 MG tablet TAKE 1 TABLET (160 MG TOTAL) BY MOUTH DAILY.  . fluticasone (FLONASE) 50 MCG/ACT nasal spray PLACE 2 SPRAYS INTO BOTH NOSTRILS DAILY.  Marland Kitchen gabapentin (NEURONTIN) 300 MG capsule TAKE ONE CAPSULE BY MOUTH EVERY 8 HOURS  . hydrochlorothiazide (  MICROZIDE) 12.5 MG capsule Take 12.5 mg by mouth 2 (two) times daily.  Marland Kitchen HYDROcodone-acetaminophen (NORCO) 10-325 MG tablet Take 1 tablet by mouth every 6 (six) hours as needed (pain).  .  isosorbide mononitrate (IMDUR) 60 MG 24 hr tablet Take 1 tablet (60 mg total) by mouth daily. NEEDS APPOINTMENT FOR FUTURE REFILLS OR 90-DAY SUPPLY  . metoprolol (LOPRESSOR) 50 MG tablet Take 1 tablet (50 mg total) by mouth 2 (two) times daily.  . Multiple Vitamin (MULTIVITAMIN) capsule Take 1 capsule by mouth daily.    . nitroGLYCERIN (NITROSTAT) 0.4 MG SL tablet Place 1 tablet (0.4 mg total) under the tongue every 5 (five) minutes as needed for chest pain.  . Polyvinyl Alcohol (LUBRICANT DROPS OP) Apply 1 drop to eye daily as needed (dry eyes).  . potassium chloride SA (KLOR-CON M20) 20 MEQ tablet Take 1 tablet (20 mEq total) by mouth daily.  . prasugrel (EFFIENT) 10 MG TABS tablet Take 10 mg by mouth daily. Reported on 10/25/2015  . rosuvastatin (CRESTOR) 40 MG tablet TAKE 1 TABLET BY MOUTH EVERY DAY    FAMILY HISTORY:  His indicated that his mother is deceased. He indicated that his father is deceased. He indicated that the status of his sister is unknown. He indicated that the status of his brother is unknown. He indicated that his maternal grandmother is deceased. He indicated that his maternal grandfather is deceased. He indicated that his paternal grandmother is deceased. He indicated that his paternal grandfather is deceased. He indicated that the status of his other is unknown.    SOCIAL HISTORY: He  reports that he has quit smoking. He has quit using smokeless tobacco. He reports that he does not drink alcohol or use drugs.  REVIEW OF SYSTEMS:   Unable as patient is encephalopathic and itubated  SUBJECTIVE:    VITAL SIGNS: BP (!) 70/55   Pulse (!) 107   Temp 97.2 F (36.2 C) (Oral)   Resp (!) 24   Ht 5\' 6"  (1.676 m)   SpO2 (!) 81%   HEMODYNAMICS:    VENTILATOR SETTINGS: Vent Mode: PRVC FiO2 (%):  [100 %] 100 % Set Rate:  [20 bmp-24 bmp] 24 bmp Vt Set:  [550 mL] 550 mL PEEP:  [10 cmH20-12 cmH20] 12 cmH20 Plateau Pressure:  [24 cmH20] 24 cmH20  INTAKE / OUTPUT: No  intake/output data recorded.  PHYSICAL EXAMINATION: General:  Elderly male on vent Neuro:  comatose HEENT:  [place]/AT, pupils non-reactive Cardiovascular:  RRR, no MRG Lungs:  Coarse bilateral breath sounds, vent supported breaths.  Abdomen:  Soft, non-distended Musculoskeletal:  No acute deformity or ROM limiation Skin:  Grossly intact, central cyanosis.   LABS:  BMET  Recent Labs Lab 04/10/2017 1339  NA 129*  K 3.6  CL 96*  CO2 17*  BUN 52*  CREATININE 3.87*  GLUCOSE 102*    Electrolytes  Recent Labs Lab 2017/04/10 1339  CALCIUM 7.7*  MG 1.4*    CBC  Recent Labs Lab 04-10-17 1339  WBC 3.6*  HGB 13.0  HCT 38.9*  PLT 94*    Coag's No results for input(s): APTT, INR in the last 168 hours.  Sepsis Markers  Recent Labs Lab 04/10/2017 1414  LATICACIDVEN 5.30*    ABG  Recent Labs Lab Apr 10, 2017 1451  PHART 7.095*  PCO2ART 40.7  PO2ART 64.0*    Liver Enzymes  Recent Labs Lab 04-10-2017 1339  AST 65*  ALT 22  ALKPHOS 35*  BILITOT 1.5*  ALBUMIN 3.4*  Cardiac Enzymes  Recent Labs Lab 04/03/17 1339  TROPONINI 3.98*    Glucose No results for input(s): GLUCAP in the last 168 hours.  Imaging Dg Chest Portable 1 View  Result Date: 04-03-2017 CLINICAL DATA:  increasing sob for several days. pt has hx of partial lung removal. Pt report productive cough. Family reports that he has had pneumonia in the past. EXAM: PORTABLE CHEST - 1 VIEW COMPARISON:  CT 08/13/2016, chest 11/14/2015 FINDINGS: Endotracheal tube has been placed, tip approximately 3.2 cm above carina. Nasogastric tube has been placed at least as far as the stomach, tip not seen. Chronic pleuroparenchymal scarring at the right lung apex is stable. Perihilar and bibasilar interstitial and airspace infiltrates or edema, left greater than right, new since previous. Heart size and mediastinal contours are within normal limits. No definite effusion.  No pneumothorax. Previous CABG. IMPRESSION:  1. Asymmetric perihilar and bibasilar edema or infiltrates, left greater than right, new since previous. 2. Endotracheal tube and nasogastric tube placement as above. Electronically Signed   By: Lucrezia Europe M.D.   On: 03-Apr-2017 15:22    STUDIES:    CULTURES: Blood 6/26 > Urine 6/26 > Tracheal asp 6/26 >  ANTIBIOTICS: Levaquin 6/26 > Vancomycin 6/26 >  SIGNIFICANT EVENTS: 6/26 Present to ED, Intubated, PEA arrest.   LINES/TUBES: ETT 6/26 >  DISCUSSION: 71 year old male presented to ED with 2 weeks dyspnea. Very hypoxemic and intubated. Remained hypoxemic and acidemic despite max vent support. Suffered cardiac arrest PEA 6-8 mins with ROSC. Remains hypoxic. Very tenuous. DNR.  ASSESSMENT / PLAN:  PULMONARY A: Acute hypoxemic respiratory failure: etiology unclear. Bibasilar infiltrates concerning for PNA vs Edema. Did not respond to high FiO2 and PEEP on vent. FiO2 100% and PEEP 15 currently on vent with O2 sats in low 80s.  H/o NSCLC s/p R upper lobectomy and radiation pneumonitis  P:   Full vent support ABG now CXR now VAP bundle Dry on exam so probably no value in diuresis.   CARDIOVASCULAR A:  PEA cardiac arrest (6 -8 mins in ED) working theory is that this is related to acidosis vs hypoxia.  CAD s/p CABG Chronic diastolic CHF Troponin elevation > NSTEMI vs demand ischemia. EKG with widespread ST depression and questionable elevation in septal leads.  P:  Telemetry monitoring Levophed for MAP goal 49mmHg Not TTM candidate due to refractory shock/hypoxia Cycle troponins DNR if arrests again Heparin infusion Will plan to place central access  RENAL A:   AKI - baseline SCr 0.8 (s/p 3L in ED) Metabolic acidosis anion gap and non gap components Hyponatremia  P:   KVO IVF Serial BMP Bicarb infusion  GASTROINTESTINAL A:   No acute issues  P:   NPO Protonix  HEMATOLOGIC A:   No acute issues  P:  Follow CBC Assess Coags  INFECTIOUS A:   ?  Severe CAP vs aspiration  P:   ABX as above Follow cultures  ENDOCRINE A:   No acute issues  P:   Follow glucose on chemistry  NEUROLOGIC A:   Acute metabolic and anoxic encephalopathy  P:   RASS goal: 0 No sedation   FAMILY  - Updates: wife and daughters updated bedside  - Inter-disciplinary family meet or Palliative Care meeting due by:  7/2   Georgann Housekeeper, AGACNP-BC [facility] Pager [phone] or [phone]  04-03-2017 4:51 PM

## 2017-04-05 NOTE — Progress Notes (Signed)
ANTICOAGULATION CONSULT NOTE - Initial Consult  Pharmacy Consult for heparin  Indication: chest pain/ACS  Allergies  Allergen Reactions  . Penicillins Shortness Of Breath and Rash    Has patient had a PCN reaction causing immediate rash, facial/tongue/throat swelling, SOB or lightheadedness with hypotension: Yes Has patient had a PCN reaction causing severe rash involving mucus membranes or skin necrosis: No Has patient had a PCN reaction that required hospitalization No Has patient had a PCN reaction occurring within the last 10 years: No If all of the above answers are "NO", then may proceed with Cephalosporin use.   Marland Kitchen Clopidogrel Bisulfate Rash    Severe rash  . Demerol Nausea And Vomiting  . Propoxyphene N-Acetaminophen Nausea Only    Patient Measurements: Height: 5\' 6"  (167.6 cm) IBW/kg (Calculated) : 63.8 Heparin Dosing Weight: 70.7 kg   Vital Signs: Temp: 98.1 F (36.7 C) (06/26 1900) Temp Source: Oral (06/26 1900) BP: 78/68 (06/26 1900) Pulse Rate: 111 (06/26 1900)  Labs:  Recent Labs  04-05-2017 1339 April 05, 2017 1910  HGB 13.0  --   HCT 38.9*  --   PLT 94*  --   APTT  --  >200*  LABPROT  --  26.3*  INR  --  2.36  CREATININE 3.87* 3.76*  TROPONINI 3.98*  --     Estimated Creatinine Clearance: 16.3 mL/min (A) (by C-G formula based on SCr of 3.76 mg/dL (H)).   Medical History: Past Medical History:  Diagnosis Date  . Anxiety    Dr. Corrinne Eagle    . ASCVD (arteriosclerotic cardiovascular disease)   . CAD (coronary artery disease)    Dr. Verl Blalock  - Cardiologist   . CHF (congestive heart failure) ([place])   . Dementia   . History of radiation therapy 11/15/08-02/23/09   RUL 66Gy/36fx  . Hyperlipidemia   . Hypertension   . Lung cancer ([place])    lung ca dx 1/10  . Neuropathic pain   . OA (osteoarthritis)   . PVD (peripheral vascular disease) ([place])    carotid artery disease  . Radiation May 2010   Right upper lobe lung/Adenocarcinoma   Assessment: 71 yo male  admitted with respiratory distress, s/p PEA arrest. Pharmacy consulted to dose heparin for ACS/CP. No reported anticoagulation PTA. Hgb 13 and platelets slightly low at 94. No overt s/s bleeding noted.   Random aptt drawn was > 200 and INR 2.36  Goal of Therapy:  Heparin level 0.3-0.7 units/ml Monitor platelets by anticoagulation protocol: Yes   Plan:  Hold hep  F/u inr aptt hep lvl in am  Restart when IN < 2  Levester Fresh, PharmD, BCPS, BCCCP Clinical Pharmacist April 05, 2017 9:31 PM

## 2017-04-05 NOTE — ED Notes (Signed)
Critical care MD at bedside

## 2017-04-05 DEATH — deceased

## 2017-04-07 ENCOUNTER — Other Ambulatory Visit: Payer: Self-pay | Admitting: Family Medicine

## 2017-04-09 ENCOUNTER — Telehealth: Payer: Self-pay

## 2017-04-09 NOTE — Telephone Encounter (Signed)
On 04/09/17 I received a death certificate from [facility] (original). The death certificate is for burial. The patient is a patient of Doctor Elsworth Soho. The death certificate will be taken to [place] (04/13/17) since Doctor Elsworth Soho is on vacation until then.  On 2017/05/13 I received the death certificate back from Doctor Elsworth Soho. I got the death certificate ready and mailed the death certificate to [employer] per the funeral home request.

## 2017-04-14 ENCOUNTER — Encounter (HOSPITAL_COMMUNITY): Payer: Medicare Other

## 2017-04-17 IMAGING — CR DG CHEST 2V
2 series · 2 of 2 positions shown · non-contrast
Comparison: None.

CLINICAL DATA: Carotid stenosis, preop. History of lung cancer with
radiation 4797. Former smoker.

EXAM:
CHEST  2 VIEW

[w chest pa]
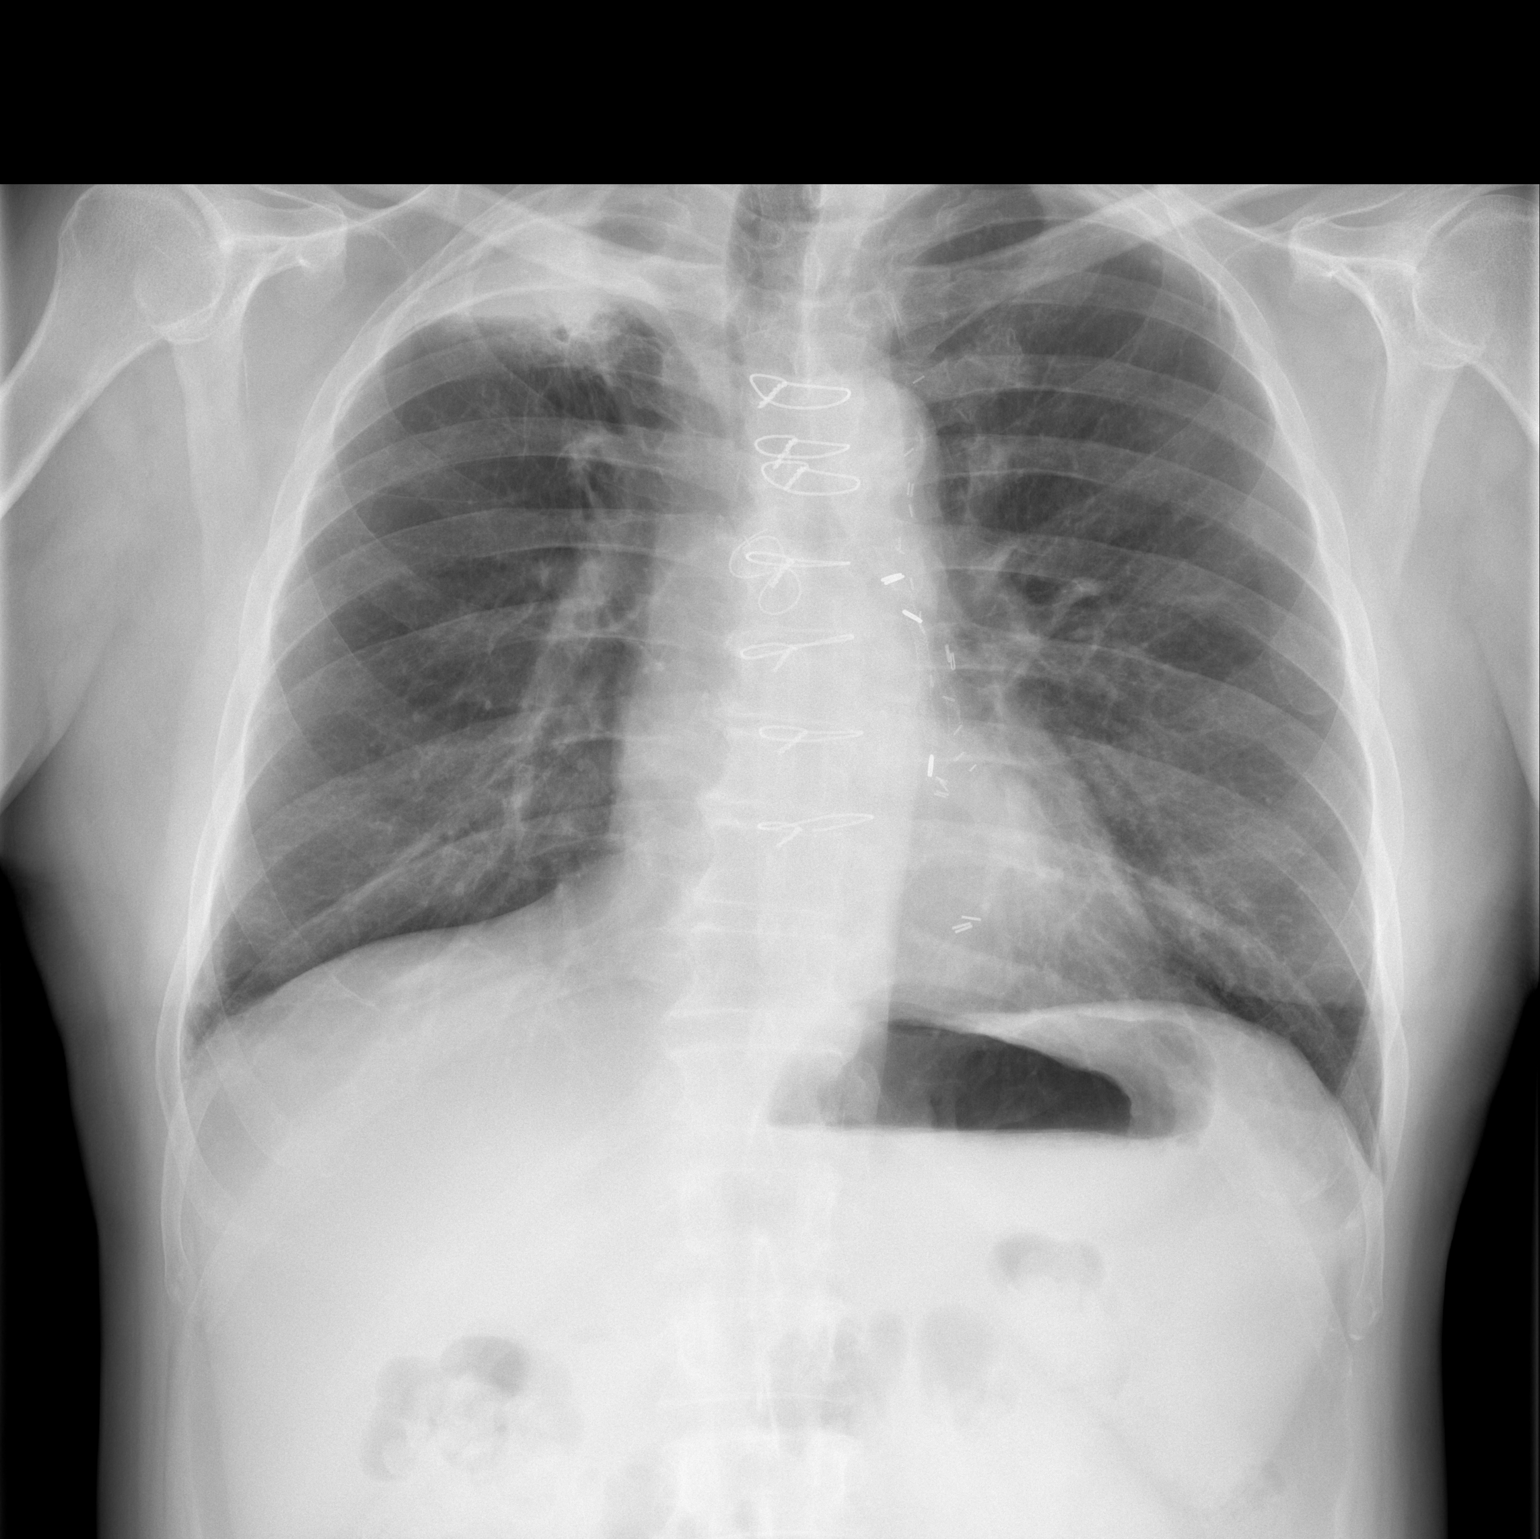

[w chest lat]
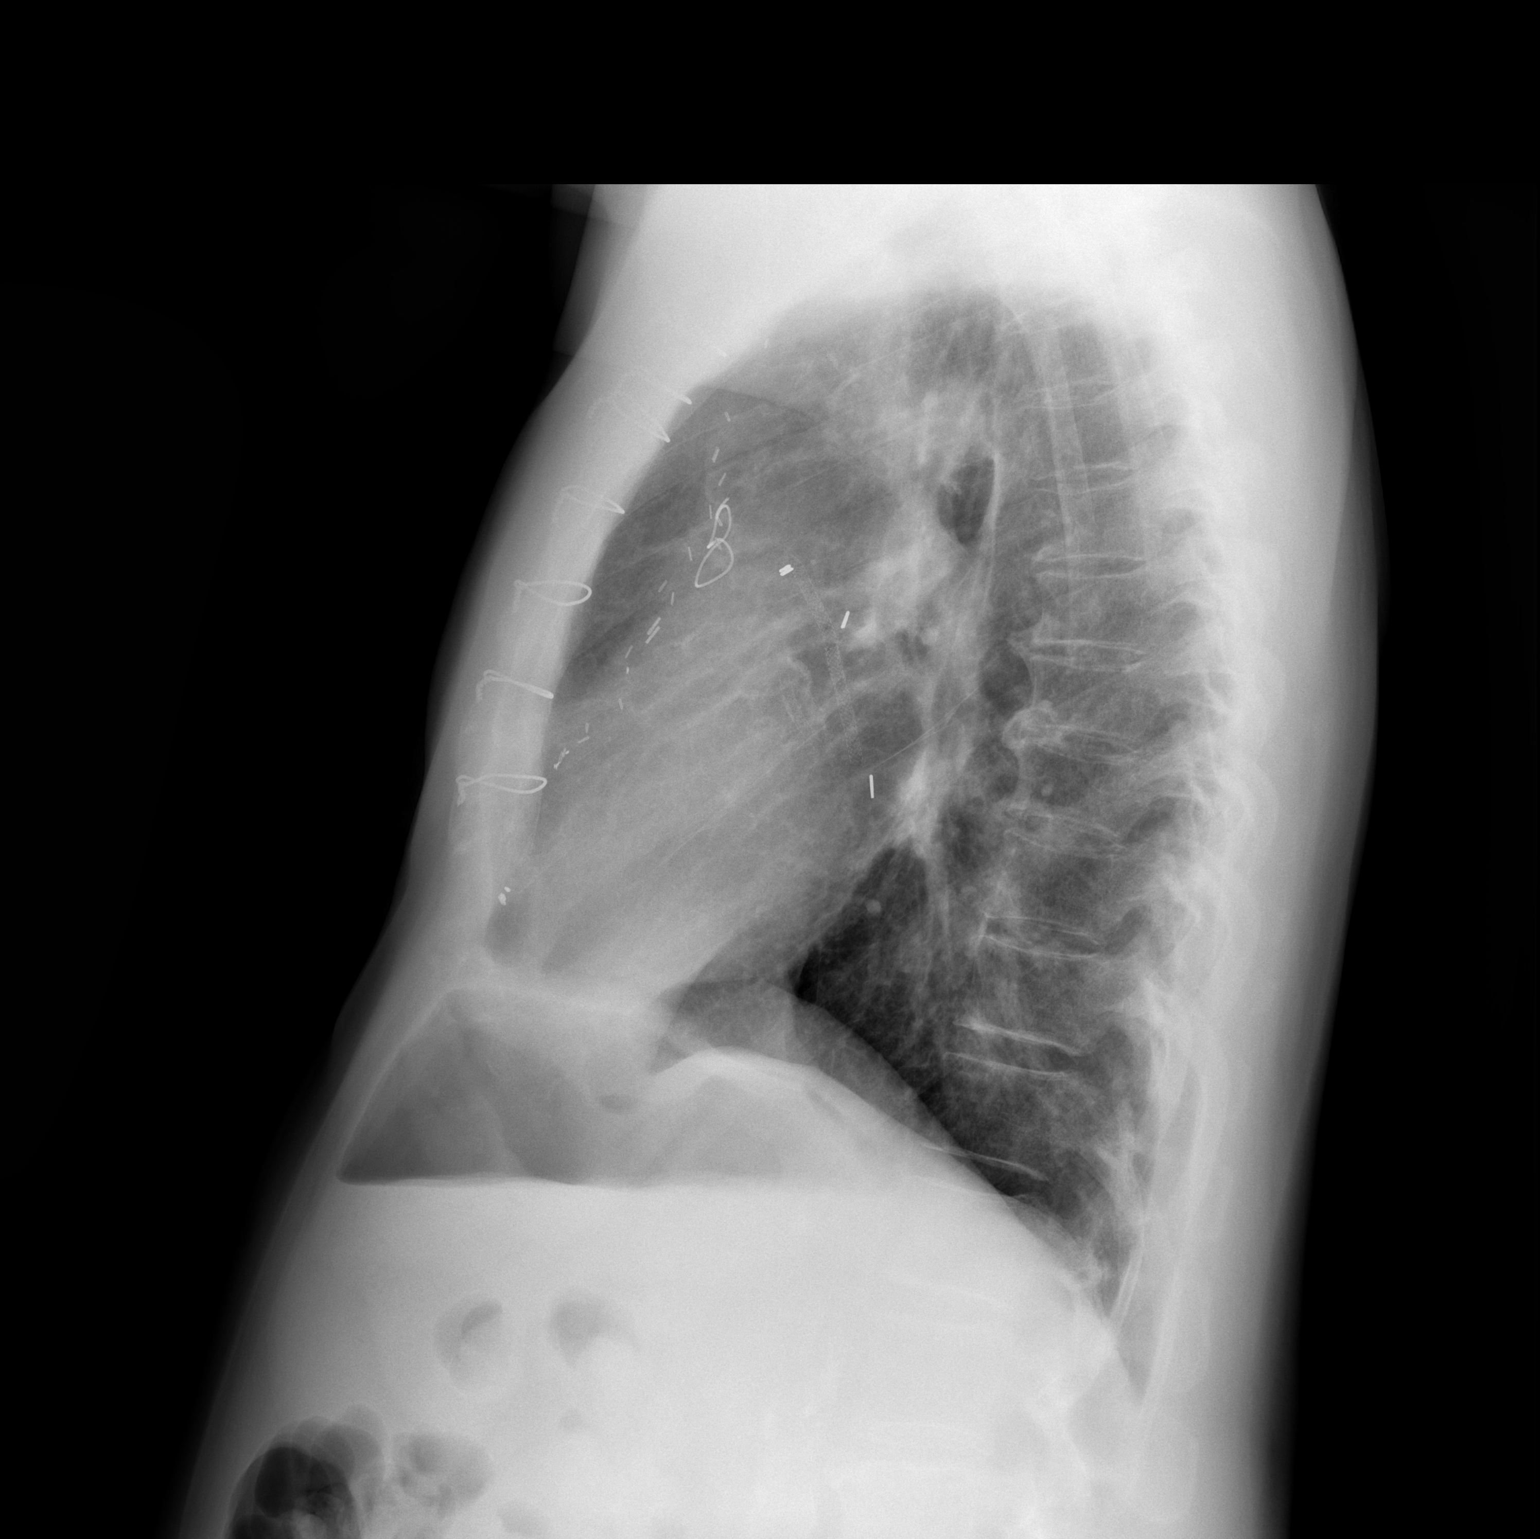

[2 of 2 positions shown; findings below may reference images not displayed]

FINDINGS: Right apical consolidation/color thickening again noted, stable,
likely related to radiation. Prior CABG. Heart is normal size. Left
lung is clear. No effusions or acute bony abnormality.
IMPRESSION: Stable chronic changes in the right apex. Prior CABG. No acute
findings.

## 2017-04-30 ENCOUNTER — Ambulatory Visit: Payer: Medicare Other

## 2017-06-10 ENCOUNTER — Ambulatory Visit: Payer: Medicare Other | Admitting: Family Medicine

## 2017-09-17 ENCOUNTER — Ambulatory Visit: Payer: Self-pay | Admitting: Radiation Oncology

## 2017-09-22 ENCOUNTER — Ambulatory Visit: Payer: Medicare Other | Admitting: Physician Assistant

## 2018-02-05 IMAGING — CT CT CHEST W/O CM
2 of 3 series · 15 of 36 positions shown, 18 images · non-contrast
Comparison: 08/15/2015

CLINICAL DATA: Malignant neoplasm of right upper lobe. Status post
radiation therapy.

EXAM:
CT CHEST WITHOUT CONTRAST
TECHNIQUE: Multidetector CT imaging of the chest was performed following the
standard protocol without IV contrast.

[Series 2: thorax · axial · 0.79mm/px · z∈[-461,-179]mm · 12 of 167 slices shown, 15 images]
[im 13/167  mediastinal]
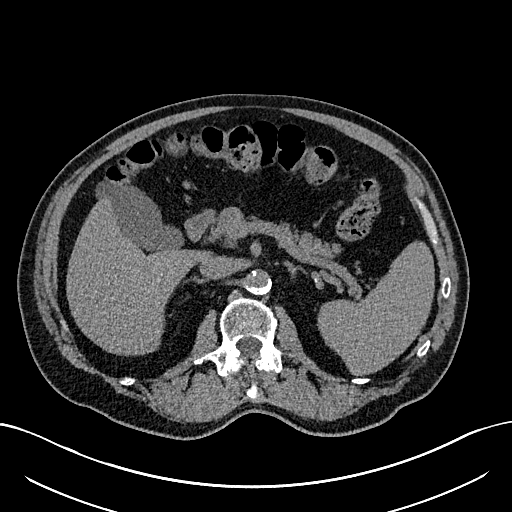
[im 13/167  lung]
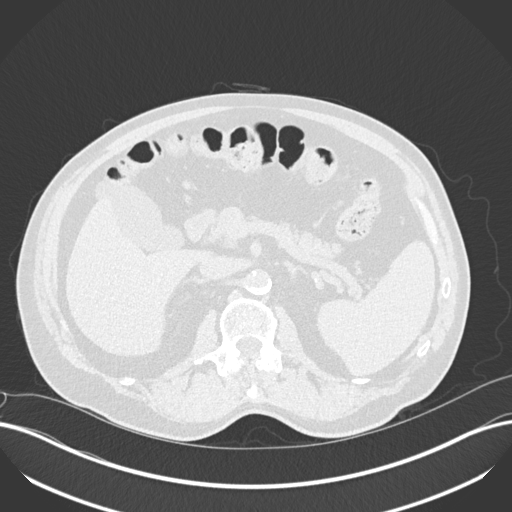
[im 25/167  lung]
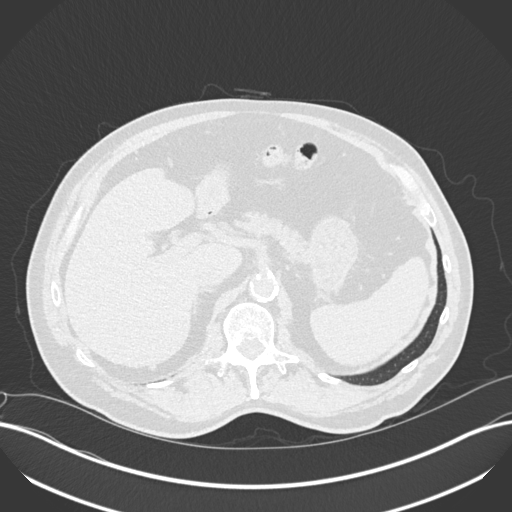
[im 37/167  lung]
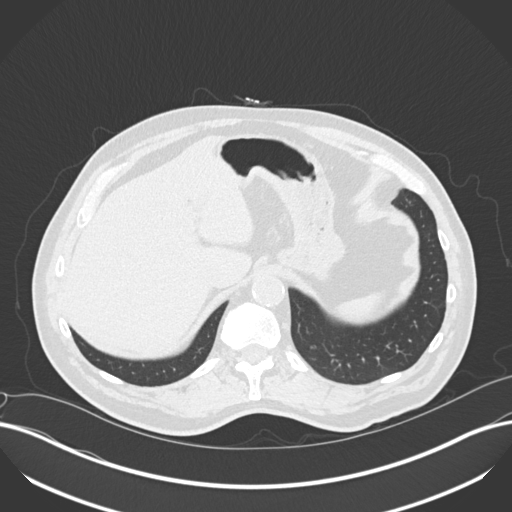
[im 50/167  lung]
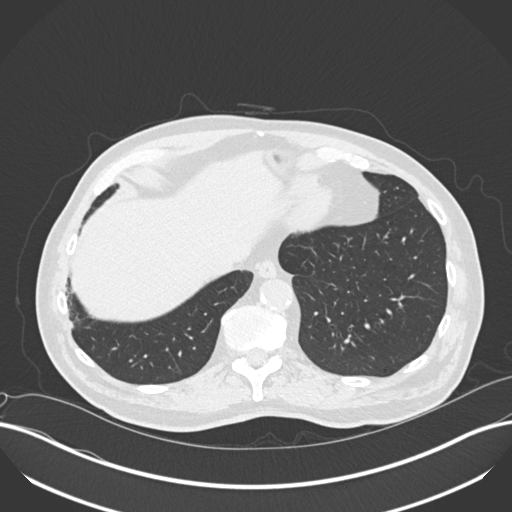
[im 62/167  mediastinal]
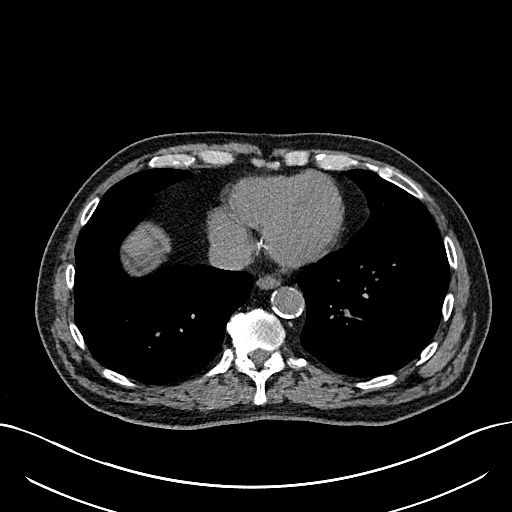
[im 62/167  lung]
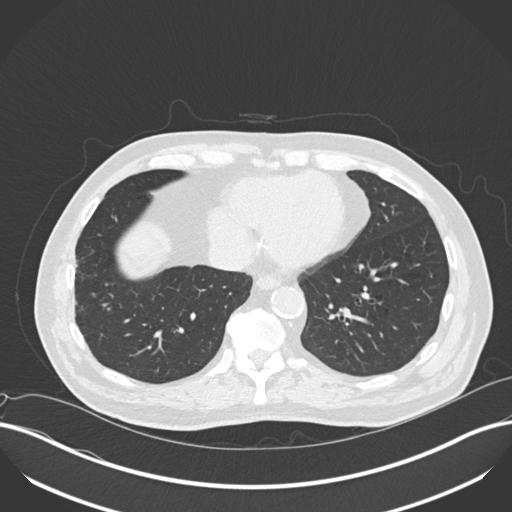
[im 74/167  lung]
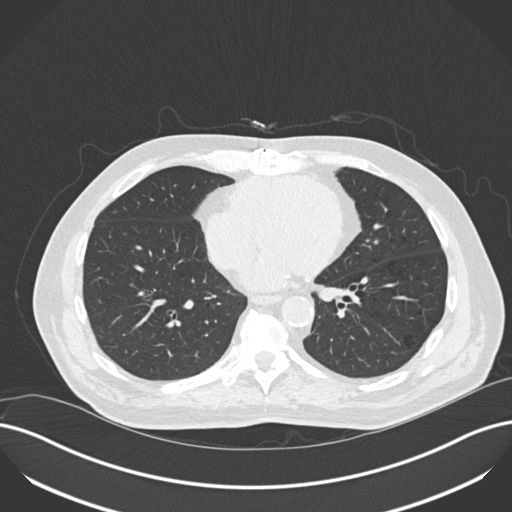
[im 93/167  lung]
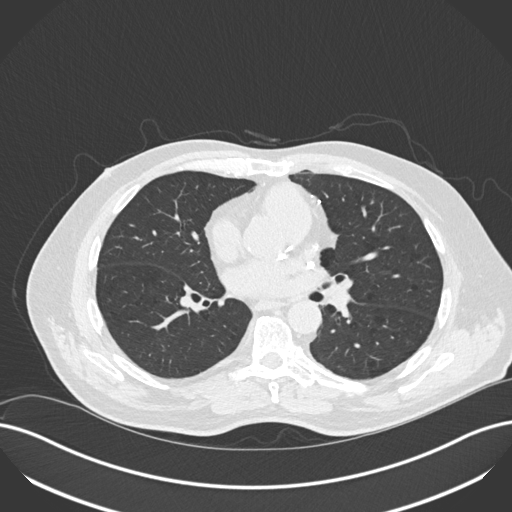
[im 105/167  lung]
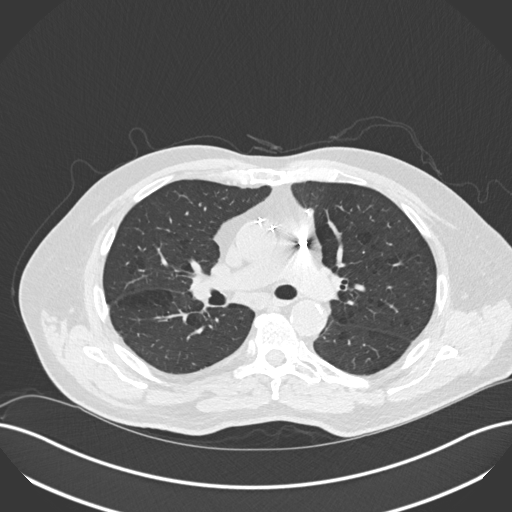
[im 117/167  mediastinal]
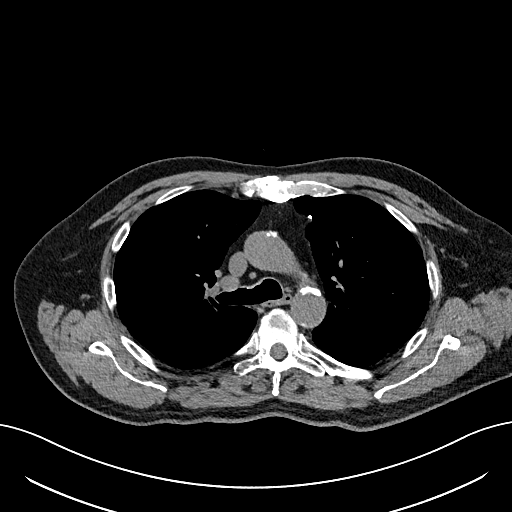
[im 117/167  lung]
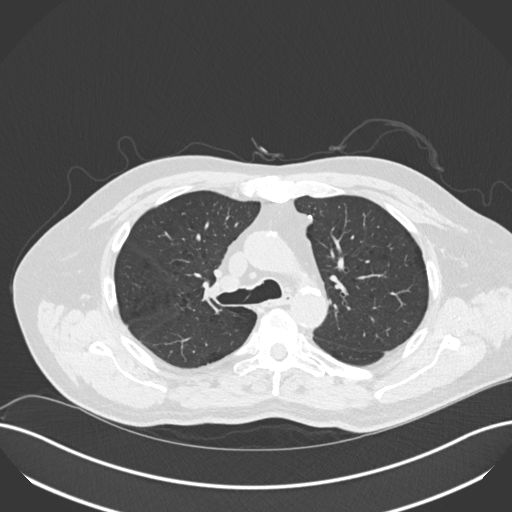
[im 130/167  lung]
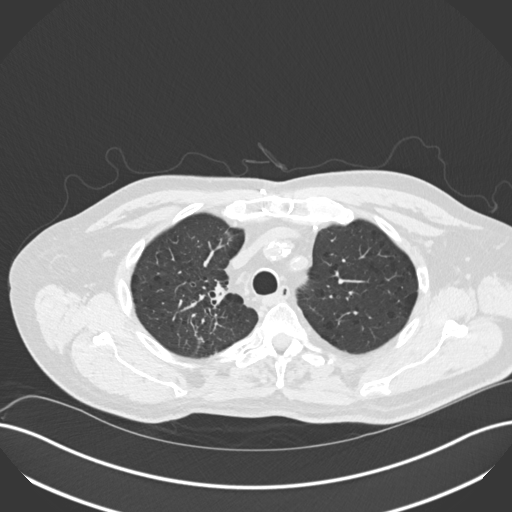
[im 142/167  lung]
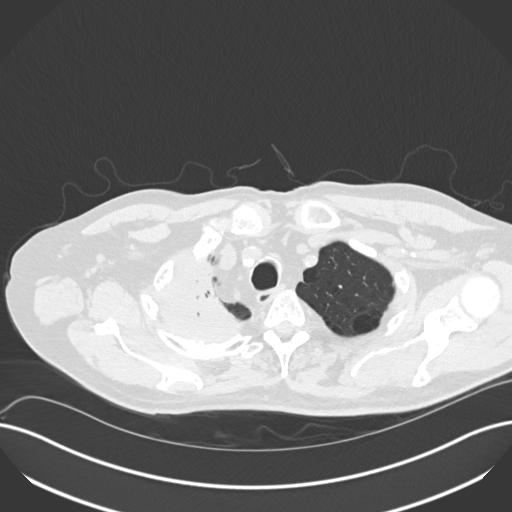
[im 154/167  lung]
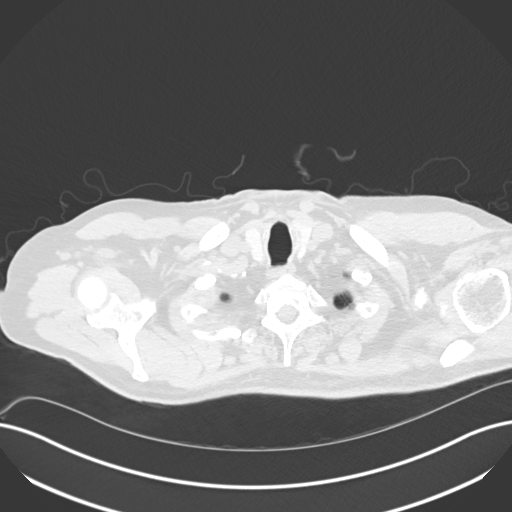

[Series 4: coronal · coronal · 0.65mm/px · 3 of 150 slices shown]
[im 30/150  lung]
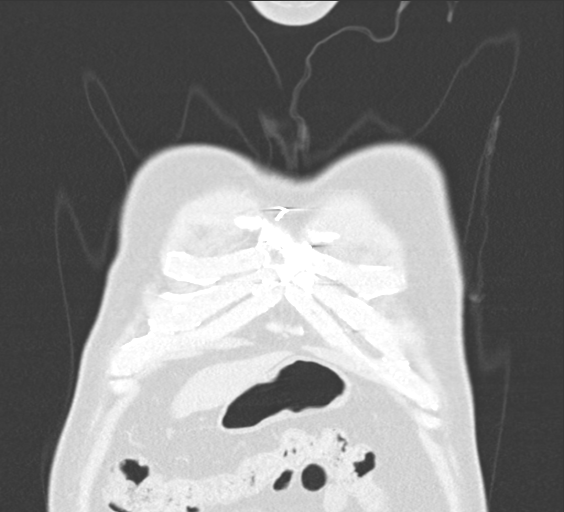
[im 60/150  lung]
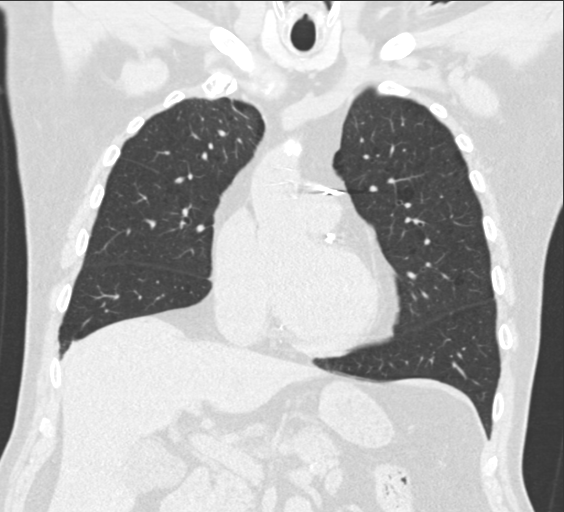
[im 90/150  lung]
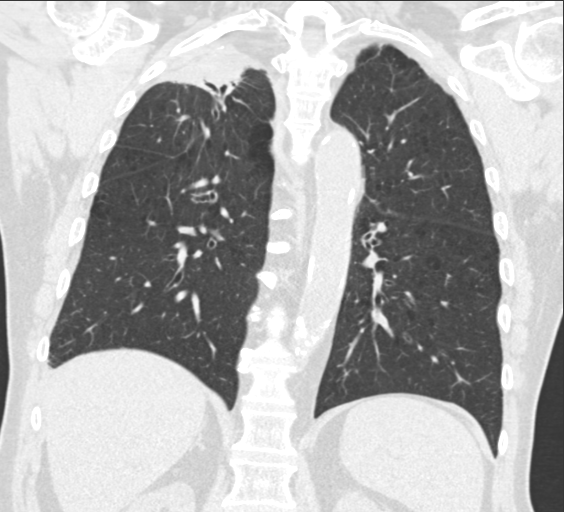

[15 of 36 positions shown; findings below may reference images not displayed]

FINDINGS: Cardiovascular: Aortic and branch vessel atherosclerosis. Prior
median sternotomy. Normal heart size, without pericardial effusion.
Native coronary artery atherosclerosis.

Mediastinum/Nodes: No supraclavicular adenopathy. Upper normal 12 mm
subcarinal node on image 65/series 2 is similar (when remeasured).
Hilar regions poorly evaluated without intravenous contrast.

Lungs/Pleura: No pleural fluid. Mild centrilobular emphysema. Lower
lobe predominant bronchial wall thickening.

Subpleural left lower lobe 2 mm nodule on image 115/ series 6 is not
significantly changed. Right apical consolidation is similar. No
evidence of locally recurrent disease.

Upper Abdomen: Normal imaged portions of the liver, spleen, stomach,
pancreas, gallbladder, biliary tract, left adrenal gland. Minimal
right adrenal nodularity is not significantly changed. Normal imaged
kidneys. Abdominal aortic atherosclerosis.

Musculoskeletal: Heterogeneous density involving upper right-sided
ribs is likely related to radiation induced necrosis. This is
grossly similar to on the prior exam. No suspicious osseous lesions.
Tiny sclerotic lesion in T3 is unchanged and likely a bone island.
IMPRESSION: 1. Radiation fibrosis in the right apex, without recurrent or
metastatic disease.
2.  Aortic atherosclerosis.
3. Upper normal size subcarinal node is unchanged, favoring a
reactive etiology.
